# Patient Record
Sex: Male | Born: 1935 | Race: Black or African American | Hispanic: No | Smoking: Never smoker
Health system: Southern US, Community
[De-identification: ages and names within clinical notes are randomized; demographics above are authoritative.]

## PROBLEM LIST (undated history)

## (undated) DIAGNOSIS — I10 Essential (primary) hypertension: Secondary | ICD-10-CM

## (undated) DIAGNOSIS — E785 Hyperlipidemia, unspecified: Secondary | ICD-10-CM

## (undated) DIAGNOSIS — Z8546 Personal history of malignant neoplasm of prostate: Secondary | ICD-10-CM

## (undated) DIAGNOSIS — I480 Paroxysmal atrial fibrillation: Secondary | ICD-10-CM

## (undated) DIAGNOSIS — E119 Type 2 diabetes mellitus without complications: Secondary | ICD-10-CM

## (undated) DIAGNOSIS — M1990 Unspecified osteoarthritis, unspecified site: Secondary | ICD-10-CM

## (undated) DIAGNOSIS — I639 Cerebral infarction, unspecified: Secondary | ICD-10-CM

## (undated) DIAGNOSIS — C801 Malignant (primary) neoplasm, unspecified: Secondary | ICD-10-CM

## (undated) HISTORY — PX: PROSTATECTOMY: SHX69

## (undated) HISTORY — PX: NO PAST SURGERIES: SHX2092

## (undated) HISTORY — DX: Unspecified osteoarthritis, unspecified site: M19.90

## (undated) HISTORY — DX: Personal history of malignant neoplasm of prostate: Z85.46

## (undated) HISTORY — PX: ANKLE SURGERY: SHX546

---

## 2007-11-18 ENCOUNTER — Ambulatory Visit: Payer: Self-pay | Admitting: Gastroenterology

## 2010-11-24 ENCOUNTER — Ambulatory Visit: Payer: Self-pay | Admitting: Gastroenterology

## 2010-11-24 HISTORY — PX: COLONOSCOPY WITH ESOPHAGOGASTRODUODENOSCOPY (EGD): SHX5779

## 2011-02-09 ENCOUNTER — Inpatient Hospital Stay: Payer: Self-pay | Admitting: Specialist

## 2011-02-10 DIAGNOSIS — Z01810 Encounter for preprocedural cardiovascular examination: Secondary | ICD-10-CM

## 2013-12-08 ENCOUNTER — Ambulatory Visit: Payer: Self-pay | Admitting: Gastroenterology

## 2013-12-11 LAB — PATHOLOGY REPORT

## 2014-05-01 DIAGNOSIS — I10 Essential (primary) hypertension: Secondary | ICD-10-CM | POA: Insufficient documentation

## 2015-11-15 DIAGNOSIS — Z8546 Personal history of malignant neoplasm of prostate: Secondary | ICD-10-CM | POA: Insufficient documentation

## 2015-11-17 ENCOUNTER — Encounter: Payer: Self-pay | Admitting: Urology

## 2015-11-17 ENCOUNTER — Ambulatory Visit (INDEPENDENT_AMBULATORY_CARE_PROVIDER_SITE_OTHER): Payer: Medicare Other | Admitting: Urology

## 2015-11-17 VITALS — BP 147/72 | HR 69 | Ht 69.0 in | Wt 164.9 lb

## 2015-11-17 DIAGNOSIS — N5231 Erectile dysfunction following radical prostatectomy: Secondary | ICD-10-CM | POA: Diagnosis not present

## 2015-11-17 DIAGNOSIS — E785 Hyperlipidemia, unspecified: Secondary | ICD-10-CM | POA: Insufficient documentation

## 2015-11-17 DIAGNOSIS — Z8546 Personal history of malignant neoplasm of prostate: Secondary | ICD-10-CM | POA: Diagnosis not present

## 2015-11-17 NOTE — Progress Notes (Signed)
11/17/2015 2:43 PM   Jeffrey Rojas [birth year] [place]:[id number]  Referring provider: No referring provider defined for this encounter.  Chief Complaint  Patient presents with  . New Patient (Initial Visit)    hx of prostate cancer     HPI: The patient is a 81 year old gentleman with a past medical history of prostate cancer status post prostatectomy 15 years ago. He presents today for follow-up. He had a PSA drawn 2 days ago which was undetectable. He has done well since his surgery. He is continent. He has no voiding complaints. He has an IPP though from erectile dysfunction. He thinks his surgery removed Gleason 7 prostate cancer though he is not sure.   PMH: Past Medical History  Diagnosis Date  . Arthritis   . History of prostate cancer     Surgical History: No past surgical history on file.  Home Medications:    Medication List       This list is accurate as of: 11/17/15  2:43 PM.  Always use your most recent med list.               amLODipine-benazepril 10-20 MG capsule  Commonly known as:  LOTREL     aspirin EC 81 MG tablet  Take by mouth.     hydrochlorothiazide 25 MG tablet  Commonly known as:  HYDRODIURIL     simvastatin 20 MG tablet  Commonly known as:  ZOCOR        Allergies: No Known Allergies  Family History: Family History  Problem Relation Age of Onset  . Prostate cancer Neg Hx   . Kidney cancer Neg Hx     Social History:  reports that he has never smoked. He does not have any smokeless tobacco history on file. He reports that he drinks alcohol. He reports that he does not use illicit drugs.  ROS: UROLOGY Frequent Urination?: No Hard to postpone urination?: No Burning/pain with urination?: No Get up at night to urinate?: Yes (x 1 ) Leakage of urine?: No Urine stream starts and stops?: No Trouble starting stream?: No Do you have to strain to urinate?: No Blood in urine?: No Urinary tract infection?: No Sexually transmitted  disease?: No Injury to kidneys or bladder?: No Painful intercourse?: No Weak stream?: No Erection problems?: No Penile pain?: No  Gastrointestinal Nausea?: No Vomiting?: No Indigestion/heartburn?: No Diarrhea?: No Constipation?: No  Constitutional Fever: No Night sweats?: No Weight loss?: No Fatigue?: No  Skin Skin rash/lesions?: No Itching?: No  Eyes Blurred vision?: No Double vision?: No  Ears/Nose/Throat Sore throat?: No Sinus problems?: No  Hematologic/Lymphatic Swollen glands?: No Easy bruising?: No  Cardiovascular Leg swelling?: No Chest pain?: No  Respiratory Cough?: No Shortness of breath?: No  Endocrine Excessive thirst?: No  Musculoskeletal Back pain?: No Joint pain?: No  Neurological Headaches?: No Dizziness?: No  Psychologic Depression?: No Anxiety?: No  Physical Exam: BP 147/72 mmHg  Pulse 69  Ht 5\' 9"  (1.753 m)  Wt 164 lb 14.4 oz (74.798 kg)  BMI 24.34 kg/m2  Constitutional:  Alert and oriented, No acute distress. HEENT: [place] AT, moist mucus membranes.  Trachea midline, no masses. Cardiovascular: No clubbing, cyanosis, or edema. Respiratory: Normal respiratory effort, no increased work of breathing. GI: Abdomen is soft, nontender, nondistended, no abdominal masses GU: No CVA tenderness. IPP in place with no sign of skin breakdown. Skin: No rashes, bruises or suspicious lesions. Lymph: No cervical or inguinal adenopathy. Neurologic: Grossly intact, no focal deficits, moving all 4 extremities.  Psychiatric: Normal mood and affect.  Laboratory Data: No results found for: WBC, HGB, HCT, MCV, PLT  No results found for: CREATININE  No results found for: PSA  No results found for: TESTOSTERONE  No results found for: HGBA1C  Urinalysis No results found for: COLORURINE, APPEARANCEUR, LABSPEC, PHURINE, GLUCOSEU, HGBUR, BILIRUBINUR, KETONESUR, PROTEINUR, UROBILINOGEN, NITRITE, LEUKOCYTESUR    Assessment & Plan:   1. History  of prostate cancer The patient should have his PSA checked annually to ensure there is no biochemical recurrence. I offered him the opportunity of this perform our office or with his primary care doctor. He elected to follow-up with his primary care doctor for PSA checks on an annual basis. I think this is reasonable given that he is artery 15 years status post surgery. He will contact the office of his PSA starts to rise.   2. Erectile dysfunction Continue IPP   Return if symptoms worsen or fail to improve.  Nickie Retort, MD  [facility] [address], [place], [place] [address] [phone]

## 2017-06-06 ENCOUNTER — Other Ambulatory Visit: Payer: Self-pay | Admitting: Orthopedic Surgery

## 2017-07-03 DIAGNOSIS — I639 Cerebral infarction, unspecified: Secondary | ICD-10-CM

## 2017-07-03 HISTORY — DX: Cerebral infarction, unspecified: I63.9

## 2017-07-04 ENCOUNTER — Encounter: Payer: Self-pay | Admitting: Emergency Medicine

## 2017-07-04 ENCOUNTER — Emergency Department
Admission: EM | Admit: 2017-07-04 | Discharge: 2017-07-04 | Disposition: A | Discharge status: Home / Self Care | Payer: Medicare Other | Attending: Emergency Medicine | Admitting: Emergency Medicine

## 2017-07-04 ENCOUNTER — Emergency Department: Payer: Medicare Other

## 2017-07-04 ENCOUNTER — Other Ambulatory Visit: Payer: Self-pay

## 2017-07-04 DIAGNOSIS — Z79899 Other long term (current) drug therapy: Secondary | ICD-10-CM | POA: Insufficient documentation

## 2017-07-04 DIAGNOSIS — R079 Chest pain, unspecified: Secondary | ICD-10-CM

## 2017-07-04 DIAGNOSIS — I10 Essential (primary) hypertension: Secondary | ICD-10-CM | POA: Diagnosis not present

## 2017-07-04 LAB — TROPONIN I
Troponin I: <0.03 ng/mL (ref <0.03)
Troponin I: <0.03 ng/mL (ref <0.03)

## 2017-07-04 LAB — CBC
HEMATOCRIT: 47.4 % (ref 40.0–52.0)
Hemoglobin: 15.5 g/dL (ref 13.0–18.0)
MCH: 29.6 pg (ref 26.0–34.0)
MCHC: 32.8 g/dL (ref 32.0–36.0)
MCV: 90.2 fL (ref 80.0–100.0)
Platelets: 242 10*3/uL (ref 150–440)
RBC: 5.25 MIL/uL (ref 4.40–5.90)
RDW: 15.8 % — AB (ref 11.5–14.5)
WBC: 6.4 10*3/uL (ref 3.8–10.6)

## 2017-07-04 LAB — BASIC METABOLIC PANEL
Anion gap: 9 (ref 5–15)
BUN: 18 mg/dL (ref 6–20)
CHLORIDE: 100 mmol/L — AB (ref 101–111)
CO2: 30 mmol/L (ref 22–32)
Calcium: 9.7 mg/dL (ref 8.9–10.3)
Creatinine, Ser: 1.19 mg/dL (ref 0.61–1.24)
GFR calc Af Amer: >60 mL/min (ref >60)
GFR calc non Af Amer: 56 mL/min — ABNORMAL LOW (ref >60)
GLUCOSE: 77 mg/dL (ref 65–99)
POTASSIUM: 3.8 mmol/L (ref 3.5–5.1)
Sodium: 139 mmol/L (ref 135–145)

## 2017-07-04 NOTE — ED Notes (Signed)
Pt states central CP x 1 week. States pain in L arm as well. Denies any N&V&D, SOB, dizziness. Pt is alert, oriented. States pain comes and goes. Describes as pressure. No cardiac hx.

## 2017-07-04 NOTE — Discharge Instructions (Signed)
You have been seen in the emergency department today for chest pain. Your workup has shown normal results. As we discussed please follow-up with your primary care physician in the next 1-2 days for recheck. Return to the emergency department for any further chest pain, trouble breathing, or any other symptom personally concerning to yourself.  Please call the number provided for cardiology to arrange a follow-up appointment for possible stress test as soon as possible.

## 2017-07-04 NOTE — ED Provider Notes (Signed)
[facility] Emergency Department Provider Note  Time seen: 9:28 PM  I have reviewed the triage vital signs and the nursing notes.   HISTORY  Chief Complaint Chest Pain    HPI Jeffrey Rojas is a 83 y.o. male with a past medical history of prostate cancer, hyperlipidemia, hypertension presents to the emergency department for chest pain.  According to the patient for the past 1-2 weeks he has been experiencing intermittent chest pain with some radiation to the left shoulder.  States when the pain occurs it will last for minutes sometimes up to 30 minutes and then it resolved.  Patient denies any shortness of breath, nausea, diaphoresis or pleuritic nature to the pain.  Denies any history of MI or cardiac disease.  Patient denies any complaints currently and states he feels well.  Denies any recent leg pain or swelling.  States when the pain does occur it will be moderate and sharp.  No pain currently.   Past Medical History:  Diagnosis Date  . Arthritis   . History of prostate cancer     Patient Active Problem List   Diagnosis Date Noted  . HLD (hyperlipidemia) 11/17/2015  . H/O malignant neoplasm of prostate 11/15/2015  . BP (high blood pressure) 05/01/2014    History reviewed. No pertinent surgical history.  Prior to Admission medications   Medication Sig Start Date End Date Taking? Authorizing Provider  amLODipine-benazepril (LOTREL) 10-20 MG capsule  04/22/15   [provider]  aspirin EC 81 MG tablet Take by mouth.    [provider]  hydrochlorothiazide (HYDRODIURIL) 25 MG tablet  04/22/15   [provider]  simvastatin (ZOCOR) 20 MG tablet  04/22/15   [provider]    No Known Allergies  Family History  Problem Relation Age of Onset  . Prostate cancer Neg Hx   . Kidney cancer Neg Hx     Social History Social History   Tobacco Use  . Smoking status: Never Smoker  . Smokeless tobacco: Never Used   Substance Use Topics  . Alcohol use: Yes    Alcohol/week: 0.0 oz    Comment: ocassionally  . Drug use: No    Review of Systems Constitutional: Negative for fever. Eyes: No visual complaints. ENT: Negative for congestion Cardiovascular: Intermittent central left-sided chest pain times 1-2 weeks.  On currently. Respiratory: Negative for shortness of breath.  Negative for cough.   Gastrointestinal: Negative for abdominal pain, vomiting.  Genitourinary: Negative for dysuria. Musculoskeletal: Negative for leg pain or swelling Skin: Negative for rash. Neurological: Negative for headache All other ROS negative  ____________________________________________   PHYSICAL EXAM:  VITAL SIGNS: ED Triage Vitals  Enc Vitals Group     BP 07/04/17 1651 139/70     Pulse Rate 07/04/17 1651 61     Resp 07/04/17 1651 18     Temp 07/04/17 1651 97.9 F (36.6 C)     Temp Source 07/04/17 1651 Oral     SpO2 07/04/17 1651 99 %     Weight 07/04/17 1652 165 lb (74.8 kg)     Height 07/04/17 1652 5\' 9"  (1.753 m)     Head Circumference --      Peak Flow --      Pain Score 07/04/17 1658 8     Pain Loc --      Pain Edu? --      Excl. in [place]? --    Constitutional: Alert and oriented. Well appearing and in no distress.  Eyes: Normal exam ENT   Head: Normocephalic and atraumatic.   Mouth/Throat: Mucous membranes are moist. Cardiovascular: Normal rate, regular rhythm.  Respiratory: Normal respiratory effort without tachypnea nor retractions. Breath sounds are clear  Gastrointestinal: Soft and nontender. No distention.   Musculoskeletal: Nontender with normal range of motion in all extremities. No lower extremity tenderness or edema. Neurologic:  Normal speech and language. No gross focal neurologic deficits  Skin:  Skin is warm, dry and intact.  Psychiatric: Mood and affect are normal.   ____________________________________________    EKG  EKG reviewed and interpreted by myself shows  normal sinus rhythm at 60 bpm with a narrow QRS, normal axis, normal intervals, no concerning ST changes.  ____________________________________________    RADIOLOGY  Chest x-ray is normal.  ____________________________________________   INITIAL IMPRESSION / ASSESSMENT AND PLAN / ED COURSE  Pertinent labs & imaging results that were available during my care of the patient were reviewed by me and considered in my medical decision making (see chart for details).  Patient presents to the emergency department for intermittent central chest pain.  Currently denies any chest pain.  Labs are normal including a negative troponin.  EKG is normal.  X-ray is normal.  Overall the patient appears very well with a normal physical exam.  Largely normal vitals.  I discussed the options with the patient, he is agreeable to a repeat troponin.  If the repeat troponin is negative we will likely discharge with cardiology follow-up for further evaluation and possible stress test.  Patient agreeable to this plan of care.  Repeat troponin is negative.  We will discharge the patient home with cardiology follow-up.  Patient agreeable to this plan of care. ____________________________________________   FINAL CLINICAL IMPRESSION(S) / ED DIAGNOSES  Chest pain    Harvest Dark, MD 07/04/17 2242

## 2017-07-04 NOTE — ED Triage Notes (Signed)
Pt brought over from [facility] with reports of chest pain.

## 2017-07-09 ENCOUNTER — Emergency Department
Admission: EM | Admit: 2017-07-09 | Discharge: 2017-07-09 | Disposition: A | Discharge status: Other Acute Inpatient Hospital | Payer: Medicare Other | Attending: Emergency Medicine | Admitting: Emergency Medicine

## 2017-07-09 ENCOUNTER — Emergency Department: Payer: Medicare Other

## 2017-07-09 ENCOUNTER — Inpatient Hospital Stay (HOSPITAL_COMMUNITY)
Admission: EM | Admit: 2017-07-09 | Discharge: 2017-07-11 | DRG: 041 | Disposition: A | Discharge status: Home / Self Care | Payer: Medicare Other | Source: Other Acute Inpatient Hospital | Attending: Neurology | Admitting: Neurology

## 2017-07-09 ENCOUNTER — Other Ambulatory Visit: Payer: Self-pay

## 2017-07-09 ENCOUNTER — Inpatient Hospital Stay (HOSPITAL_COMMUNITY): Payer: Medicare Other

## 2017-07-09 DIAGNOSIS — R471 Dysarthria and anarthria: Secondary | ICD-10-CM | POA: Diagnosis present

## 2017-07-09 DIAGNOSIS — Z8546 Personal history of malignant neoplasm of prostate: Secondary | ICD-10-CM | POA: Diagnosis not present

## 2017-07-09 DIAGNOSIS — I63432 Cerebral infarction due to embolism of left posterior cerebral artery: Secondary | ICD-10-CM | POA: Diagnosis present

## 2017-07-09 DIAGNOSIS — R29709 NIHSS score 9: Secondary | ICD-10-CM | POA: Diagnosis present

## 2017-07-09 DIAGNOSIS — R410 Disorientation, unspecified: Secondary | ICD-10-CM | POA: Diagnosis present

## 2017-07-09 DIAGNOSIS — I10 Essential (primary) hypertension: Secondary | ICD-10-CM | POA: Diagnosis present

## 2017-07-09 DIAGNOSIS — I639 Cerebral infarction, unspecified: Secondary | ICD-10-CM

## 2017-07-09 DIAGNOSIS — E785 Hyperlipidemia, unspecified: Secondary | ICD-10-CM | POA: Diagnosis present

## 2017-07-09 DIAGNOSIS — H5340 Unspecified visual field defects: Secondary | ICD-10-CM | POA: Diagnosis present

## 2017-07-09 DIAGNOSIS — R414 Neurologic neglect syndrome: Secondary | ICD-10-CM | POA: Diagnosis present

## 2017-07-09 DIAGNOSIS — Z79899 Other long term (current) drug therapy: Secondary | ICD-10-CM | POA: Insufficient documentation

## 2017-07-09 DIAGNOSIS — R1310 Dysphagia, unspecified: Secondary | ICD-10-CM | POA: Diagnosis present

## 2017-07-09 DIAGNOSIS — R4701 Aphasia: Secondary | ICD-10-CM | POA: Diagnosis present

## 2017-07-09 DIAGNOSIS — Z7982 Long term (current) use of aspirin: Secondary | ICD-10-CM

## 2017-07-09 DIAGNOSIS — R7303 Prediabetes: Secondary | ICD-10-CM | POA: Diagnosis present

## 2017-07-09 DIAGNOSIS — G8191 Hemiplegia, unspecified affecting right dominant side: Secondary | ICD-10-CM | POA: Diagnosis present

## 2017-07-09 DIAGNOSIS — I351 Nonrheumatic aortic (valve) insufficiency: Secondary | ICD-10-CM | POA: Diagnosis not present

## 2017-07-09 DIAGNOSIS — I6389 Other cerebral infarction: Secondary | ICD-10-CM | POA: Diagnosis not present

## 2017-07-09 DIAGNOSIS — I340 Nonrheumatic mitral (valve) insufficiency: Secondary | ICD-10-CM | POA: Diagnosis not present

## 2017-07-09 DIAGNOSIS — R29702 NIHSS score 2: Secondary | ICD-10-CM | POA: Diagnosis not present

## 2017-07-09 DIAGNOSIS — R0789 Other chest pain: Secondary | ICD-10-CM

## 2017-07-09 DIAGNOSIS — E7800 Pure hypercholesterolemia, unspecified: Secondary | ICD-10-CM | POA: Diagnosis not present

## 2017-07-09 DIAGNOSIS — I63332 Cerebral infarction due to thrombosis of left posterior cerebral artery: Secondary | ICD-10-CM | POA: Diagnosis not present

## 2017-07-09 DIAGNOSIS — I6300 Cerebral infarction due to thrombosis of unspecified precerebral artery: Secondary | ICD-10-CM | POA: Diagnosis not present

## 2017-07-09 HISTORY — DX: Cerebral infarction, unspecified: I63.9

## 2017-07-09 HISTORY — DX: Malignant (primary) neoplasm, unspecified: C80.1

## 2017-07-09 LAB — COMPREHENSIVE METABOLIC PANEL
ALBUMIN: 4.6 g/dL (ref 3.5–5.0)
ALT: 25 U/L (ref 17–63)
AST: 26 U/L (ref 15–41)
Alkaline Phosphatase: 94 U/L (ref 38–126)
Anion gap: 11 (ref 5–15)
BUN: 17 mg/dL (ref 6–20)
CO2: 27 mmol/L (ref 22–32)
Calcium: 10 mg/dL (ref 8.9–10.3)
Chloride: 99 mmol/L — ABNORMAL LOW (ref 101–111)
Creatinine, Ser: 1.16 mg/dL (ref 0.61–1.24)
GFR calc Af Amer: >60 mL/min (ref >60)
GFR calc non Af Amer: 58 mL/min — ABNORMAL LOW (ref >60)
Glucose, Bld: 131 mg/dL — ABNORMAL HIGH (ref 65–99)
POTASSIUM: 3.5 mmol/L (ref 3.5–5.1)
SODIUM: 137 mmol/L (ref 135–145)
Total Bilirubin: 0.5 mg/dL (ref 0.3–1.2)
Total Protein: 8.7 g/dL — ABNORMAL HIGH (ref 6.5–8.1)

## 2017-07-09 LAB — CBC
HCT: 49.9 % (ref 40.0–52.0)
HEMOGLOBIN: 16.4 g/dL (ref 13.0–18.0)
MCH: 29.5 pg (ref 26.0–34.0)
MCHC: 32.9 g/dL (ref 32.0–36.0)
MCV: 89.6 fL (ref 80.0–100.0)
Platelets: 262 10*3/uL (ref 150–440)
RBC: 5.57 MIL/uL (ref 4.40–5.90)
RDW: 15.5 % — AB (ref 11.5–14.5)
WBC: 10.7 10*3/uL — ABNORMAL HIGH (ref 3.8–10.6)

## 2017-07-09 LAB — DIFFERENTIAL
BASOS ABS: 0.1 10*3/uL (ref 0–0.1)
Basophils Relative: 1 %
EOS ABS: 0 10*3/uL (ref 0–0.7)
Eosinophils Relative: 0 %
Lymphocytes Relative: 40 %
Lymphs Abs: 4.2 10*3/uL — ABNORMAL HIGH (ref 1.0–3.6)
Monocytes Absolute: 0.8 10*3/uL (ref 0.2–1.0)
Monocytes Relative: 8 %
Neutro Abs: 5.6 10*3/uL (ref 1.4–6.5)
Neutrophils Relative %: 51 %

## 2017-07-09 LAB — APTT: APTT: 29 s (ref 24–36)

## 2017-07-09 LAB — PROTIME-INR
INR: 1.03
Prothrombin Time: 13.4 seconds (ref 11.4–15.2)

## 2017-07-09 LAB — GLUCOSE, CAPILLARY: GLUCOSE-CAPILLARY: 125 mg/dL — AB (ref 65–99)

## 2017-07-09 LAB — TROPONIN I

## 2017-07-09 MED ORDER — SODIUM CHLORIDE 0.9 % IV SOLN: 50.0000 mL | Freq: Once | INTRAVENOUS | Status: DC

## 2017-07-09 MED ORDER — ALTEPLASE 100 MG IV SOLR
INTRAVENOUS | Status: AC
  Filled 2017-07-09: qty 100

## 2017-07-09 MED ORDER — STROKE: EARLY STAGES OF RECOVERY BOOK
Freq: Once | Status: AC
  Administered 2017-07-10: 01:00:00
  Filled 2017-07-09: qty 1

## 2017-07-09 MED ORDER — ACETAMINOPHEN 160 MG/5ML PO SOLN: 650.0000 mg | ORAL | Status: DC | PRN

## 2017-07-09 MED ORDER — ACETAMINOPHEN 650 MG RE SUPP: 650.0000 mg | RECTAL | Status: DC | PRN

## 2017-07-09 MED ORDER — NICARDIPINE HCL IN NACL 20-0.86 MG/200ML-% IV SOLN: 0.0000 mg/h | INTRAVENOUS | Status: DC

## 2017-07-09 MED ORDER — LABETALOL HCL 5 MG/ML IV SOLN: 20.0000 mg | Freq: Once | INTRAVENOUS | Status: DC

## 2017-07-09 MED ORDER — ALTEPLASE (STROKE) FULL DOSE INFUSION: 0.9000 mg/kg | Freq: Once | INTRAVENOUS | Status: DC

## 2017-07-09 MED ORDER — ACETAMINOPHEN 325 MG PO TABS
650.0000 mg | ORAL_TABLET | ORAL | Status: DC | PRN
  Administered 2017-07-11: 650 mg via ORAL
  Filled 2017-07-09: qty 2

## 2017-07-09 MED ORDER — IOPAMIDOL (ISOVUE-370) INJECTION 76%
100.0000 mL | Freq: Once | INTRAVENOUS | Status: AC | PRN
  Administered 2017-07-09: 100 mL via INTRAVENOUS

## 2017-07-09 MED ORDER — SODIUM CHLORIDE 0.9 % IV SOLN
INTRAVENOUS | Status: DC
  Administered 2017-07-09: 22:00:00 via INTRAVENOUS

## 2017-07-09 MED ORDER — SENNOSIDES-DOCUSATE SODIUM 8.6-50 MG PO TABS: 1.0000 | ORAL_TABLET | Freq: Every evening | ORAL | Status: DC | PRN

## 2017-07-09 NOTE — ED Notes (Signed)
Family friend at bedside.

## 2017-07-09 NOTE — Consult Note (Signed)
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 The risks/benefits/alternatives of IV tpa, including a 6 percent risk of brain bleed and 3 percent risk of death, was reviewed with the patient's daughter, and she consented to IV tpa.  Symptoms consistent with LVO therefore I recommend CTA/P head/neck.   Differential Diagnosis:  1. Cardioembolic stroke  2. Small vessel disease/lacune  3. Thromboembolic, artery-to-artery mechanism  4. Hypercoagulable state-related infarct  5. Transient ischemic attack  6. Thrombotic mechanism, large artery disease   Comments:   Door time:  1843 [facility] contacted: 1911 [facility] at bedside: 1917 NIHSS assessment time: 1924 Verbal tpa order: 1927 Needle time:   Verbal Consent to tPA:  I have explained to the patient/family/guardian the nature of the patient's condition, the use of tPA fibrinolytic agent, and the benefits to be reasonably expected compared with alternative approaches. I have discussed the likelihood of major risks or complications of this procedure including (if applicable) but not limited to loss of limb function, brain damage, paralysis, hemorrhage, infection, complications from transfusion of blood components, drug reactions, blood clots and loss of life. I have also indicated that with any procedure there is always the possibility of an unexpected complication. I have explained the risks which include:    1. Death, Stroke or permanent neurologic injury (paralysis, coma, etc)   2. Worsening of stroke symptoms from swelling or bleeding in the brain   3. Bleeding in other parts of the body   4. Need for blood transfusions to replace blood or clotting factors   5. Allergic reaction to medications   6. Other unexpected complications    All questions were answered and  the patient/family/guardian express understanding of the treatment plan and consent to the procedure.  Our recommendations are outlined below.   We will be seeing the patient back in follow up as noted.    Recommendations:   IV tPA - dose = 67.5 mg Routine post tPA monitoring including neuro checks and blood pressure control during/after treatment Monitor blood pressure Check blood pressure and NIHSS every 15 min for 2 h, then every 30 min for 6 h, and finally every hour for 16 h  Systolic greater than 967 OR diastolic greater than 893: Option 1: Labetalol 10 mg IV for 1 - 2 min May repeat or double labetalol every 10 min to maximum dose of 300 mg, or give initial labetalol dose, then start labetalol drip at 2 - 8 mg/min. Option 2: Nicardipine 5 mg/h IV infusion as initial dose and titrate to desired effect by increasing 2.5 mg/h every 5 min to maximum of 15 mg/h;  If blood pressure is not controlled by labetolol or nicardipine, consider sodium nitroprusside.  Admission to ICU CT brain 24 hours post tPA NPO until swallowing screen performed and passed No antiplatelet agents or anticoagulants (including heparin for DVT prophylaxis) in first 24 hours No Foley catheter, nasogastric tube, arterial catheter or central venous catheter for 24 hr, unless absolutely necessary Telemetry  Inpatient Neurology Consultation Stroke evaluation as per inpatient neurology recommendations Discussed with ED MD   ------------------------------------------------------------------------------  CC right arm weakness, VF cut, difficulty speaking.   History of Present Illness   Patient is a 83 yo woman with acute onset difficulty speaking, right VF cut and right arm weakness starting at approx 1840. No LOC/convulsion. No hx of stroke. No  blood thinners.   Diagnostics: CT head unremarkable.   Exam   RESULT SUMMARY: 10 points NIH Stroke Scale   INPUTS: 1A: Level of consciousness -> 0 = Alert; keenly  responsive 1B: Ask month and age -> 0 = Both questions right 1C: 'Blink eyes' & 'squeeze hands' -> 0 = Performs both tasks 2: Horizontal extraocular movements -> 1 = Partial gaze palsy: can be overcome 3: Visual fields -> 0 = No visual loss 4: Facial palsy -> 1 = Minor paralysis (flat nasolabial fold, smile asymmetry) 5A: Left arm motor drift -> 0 = No drift for 10 seconds 5B: Right arm motor drift -> 4 = No movement 6A: Left leg motor drift -> 0 = No drift for 5 seconds 6B: Right leg motor drift -> 1 = Drift, but doesn't hit bed 7: Limb Ataxia -> 0 = No ataxia 8: Sensation -> 0 = Normal; no sensory loss 9: Language/aphasia -> 2 = Severe aphasia: fragmentary expression, inference needed, cannot identify materials 10: Dysarthria -> 1 = Mild-moderate dysarthria: slurring but can be understood 11: Extinction/inattention -> 0 = No abnormality  Medical Decision Making:  - Extensive number of diagnosis or management options are considered above.   - Extensive amount of complex data reviewed.   - High risk of complication and/or morbidity or mortality are associated with differential diagnostic considerations above.  - There may be Uncertain outcome and increased probability of prolonged functional impairment or high probability of severe prolonged functional impairment associated with some of these differential diagnosis.  Medical Data Reviewed:  1.Data reviewed include clinical labs, radiology,  Medical Tests;   2.Tests results discussed w/performing or interpreting physician;   3.Obtaining/reviewing old medical records;  4.Obtaining case history from another source;  5.Independent review of image, tracing or specimen.    Patient was informed the Neurology Consult would happen via telehealth (remote video) and consented to receiving care in this manner.

## 2017-07-09 NOTE — ED Notes (Signed)
Family friend report pt had a Stress test on 07/05/2017

## 2017-07-09 NOTE — ED Notes (Signed)
Dr. Burlene Arnt order to start a 18 gauge IV needed for CT

## 2017-07-09 NOTE — ED Notes (Signed)
Given report to [facility], RN called at [phone]

## 2017-07-09 NOTE — ED Notes (Signed)
TPA bolus and infusion started per [facility] without interruption and given per policy.

## 2017-07-09 NOTE — ED Notes (Signed)
Dr. Denzil Magnuson spoke with pt's daughter who gave verbal order for treatment

## 2017-07-09 NOTE — ED Triage Notes (Signed)
Pt was pulled out of car by this RN and [employer]. Pt states he isn't feeling well. Pt was here visiting a pt and was driving away. Family noticed he wasn't acting right. Pt speech seems slurred. Pt isn't following all commands. Lifting R leg. Won't squeeze this RN's fingers. Won't look at this RN when asked to look. Won't smile to assess facial droop.

## 2017-07-09 NOTE — ED Notes (Signed)
Given report to [facility]

## 2017-07-09 NOTE — ED Notes (Signed)
Dr. Hal Morales tele neuro started assessment

## 2017-07-09 NOTE — ED Notes (Signed)
Administered bolos TPA 6.55ml per Dr. Geraldine Solar orders

## 2017-07-09 NOTE — ED Notes (Signed)
Pt left exam room

## 2017-07-09 NOTE — ED Notes (Signed)
Pt back from CT RN

## 2017-07-09 NOTE — H&P (Signed)
H&P  CC: RSW, aphasia, s/p tPA transfer from [facility]  History is obtained from:chart, patient  HPI: Jeffrey Rojas is a 83 y.o. 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 He was brought to [facility] where a tele neurology consultation was done for suspected stroke. NIHSS on tele eval was 9, no contraindications to tPA and was given tPA and transferred to [facility] for HLOC. CT head ASPECTS 10. CTA H+N and CTP showed Lt P1 occlusion. Area at risk 42, area of core 25. Mismatch 17cc. On arrival here in the [facility] ICU, the patient was evaluated at bedside.  He had no new complaints.  He denied any headaches or visual disturbances.  He denied any tingling numbness or weakness. His NIH stroke scale on my examination was 2.   LKW: 1840 hrs. tpa given?:  Yes-at [facility] Premorbid modified Rankin scale (mRS): 0  ROS: A 14 point ROS was performed and is negative except as noted in the HPI.   Review of Systems  Constitutional: Negative for chills and fever.  HENT: Negative for hearing loss and tinnitus.   Eyes: Negative for blurred vision and double vision.  Respiratory: Negative for cough and hemoptysis.   Cardiovascular: Negative for chest pain and palpitations.  Gastrointestinal: Negative for heartburn, nausea and vomiting.  Genitourinary: Negative for dysuria and urgency.  Musculoskeletal: Negative for myalgias and neck pain.  Skin: Negative for itching and rash.  Neurological: Negative for dizziness, tingling, tremors, sensory change, speech change, focal weakness, seizures, loss of consciousness and headaches.  Endo/Heme/Allergies: Negative for environmental allergies. Does not bruise/bleed easily.  Psychiatric/Behavioral: Negative for depression and suicidal ideas.    Past Medical History:  Diagnosis Date  . Arthritis   . Cancer ([place])   . History of prostate cancer      Family History  Problem Relation Age of Onset  . Prostate cancer Neg Hx   . Kidney cancer Neg Hx    Social History:   reports that  has never smoked. he has never used smokeless tobacco. He reports that he drinks alcohol. He reports that he does not use drugs.  Medications  Current Facility-Administered Medications:  .   stroke: mapping our early stages of recovery book, , Does not apply, Once, Amie Portland, MD .  0.9 %  sodium chloride infusion, , Intravenous, Continuous, Amie Portland, MD .  acetaminophen (TYLENOL) tablet 650 mg, 650 mg, Oral, Q4H PRN **OR** acetaminophen (TYLENOL) solution 650 mg, 650 mg, Per Tube, Q4H PRN **OR** acetaminophen (TYLENOL) suppository 650 mg, 650 mg, Rectal, Q4H PRN, Amie Portland, MD .  labetalol (NORMODYNE,TRANDATE) injection 20 mg, 20 mg, Intravenous, Once **AND** nicardipine (CARDENE) 20mg  in 0.86% saline 260ml IV infusion (0.1 mg/ml), 0-15 mg/hr, Intravenous, Continuous, Amie Portland, MD .  senna-docusate (Senokot-S) tablet 1 tablet, 1 tablet, Oral, QHS PRN, Amie Portland, MD  Exam: Current vital signs: BP (!) 152/75 (BP Location: Left Arm)   Pulse 81   Temp 97.7 F (36.5 C) (Oral)   Resp 15   Ht 5\' 9"  (1.753 m)   Wt 70.9 kg (156 lb 4.9 oz)   SpO2 99%   BMI 23.08 kg/m  Vital signs in last 24 hours: Temp:  [97.7 F (36.5 C)-98 F (36.7 C)] 97.7 F (36.5 C) (01/07 2112) Pulse Rate:  [70-95] 81 (01/07 2112) Resp:  [15-23] 15 (01/07 2112) BP: (141-162)/(70-97)  152/75 (01/07 2112) SpO2:  [99 %-100 %] 99 % (01/07 2112) Weight:  [70.9 kg (156 lb 4.9 oz)-74.8 kg (165 lb)] 70.9 kg (156 lb 4.9 oz) (01/07 2112) GENERAL: Awake, alert in NAD HEENT: - Normocephalic and atraumatic, dry mm, no LN++, no Thyromegally LUNGS - Clear to auscultation bilaterally with no wheezes CV - S1S2 RRR, no m/r/g, equal pulses bilaterally. ABDOMEN - Soft, nontender, nondistended with normoactive BS Ext: warm, well perfused, intact peripheral pulses, no  edema NEURO:  Mental Status: AA&Ox3  Language: speech is clear.  Naming, repetition, fluency, and comprehension intact Cranial Nerves: PERRL. EOMI, visual fields- right HH, no facial asymmetry, facial sensation intact, hearing intact, tongue/uvula/soft palate midline, norma sternocleidomastoid and trapezius muscle strength. No evidence of tongue atrophy or fibrillations Motor: 5/5 all over - no drift Tone: is normal and bulk is normal Sensation- Intact to light touch bilaterally - no extinction Coordination: FTN intact bilaterally, no ataxia in BLE. Gait- deferred  NIHSS 1a Level of Conscious.: 0 1b LOC Questions: 0 1c LOC Commands: 0 2 Best Gaze: 0 3 Visual: 2 4 Facial Palsy: 0 5a Motor Arm - left: 0 5b Motor Arm - Right: 0 6a Motor Leg - Left: 0 6b Motor Leg - Right: 0 7 Limb Ataxia: 0 8 Sensory: 0 9 Best Language: 0 10 Dysarthria: 0 11 Extinct. and Inatten.: 0 TOTAL: 2  Labs I have reviewed labs in epic and the results pertinent to this consultation are:  CBC    Component Value Date/Time   WBC 10.7 (H) 07/09/2017 1902   RBC 5.57 07/09/2017 1902   HGB 16.4 07/09/2017 1902   HCT 49.9 07/09/2017 1902   PLT 262 07/09/2017 1902   MCV 89.6 07/09/2017 1902   MCH 29.5 07/09/2017 1902   MCHC 32.9 07/09/2017 1902   RDW 15.5 (H) 07/09/2017 1902   LYMPHSABS 4.2 (H) 07/09/2017 1902   MONOABS 0.8 07/09/2017 1902   EOSABS 0.0 07/09/2017 1902   BASOSABS 0.1 07/09/2017 1902    CMP     Component Value Date/Time   NA 137 07/09/2017 1902   K 3.5 07/09/2017 1902   CL 99 (L) 07/09/2017 1902   CO2 27 07/09/2017 1902   GLUCOSE 131 (H) 07/09/2017 1902   BUN 17 07/09/2017 1902   CREATININE 1.16 07/09/2017 1902   CALCIUM 10.0 07/09/2017 1902   PROT 8.7 (H) 07/09/2017 1902   ALBUMIN 4.6 07/09/2017 1902   AST 26 07/09/2017 1902   ALT 25 07/09/2017 1902   ALKPHOS 94 07/09/2017 1902   BILITOT 0.5 07/09/2017 1902   GFRNONAA 58 (L) 07/09/2017 1902   GFRAA >60 07/09/2017 1902     Lipid Panel  No results found for: CHOL, TRIG, HDL, CHOLHDL, VLDL, LDLCALC, LDLDIRECT   Imaging I have reviewed the images obtained:[facility]H at [facility] -no acute changes, no evidence of bleed CT perfusion study done at [facility] showed left P1 occlusion with CBF less than 30% of 25 cc, T-max more than 6 seconds of 42 cc with a mismatch volume of 17 cc. Lot of streaking artifact, question left vertebral artery dissection.  Assessment:  83 year old man with a past medical history of prostate cancer last seen normal at 1840 hrs. on 07/09/2017, brought in to the emergency room at [facility] for not feeling right and being confused.  He was noted to have right-sided weakness, aphasia and right-sided neglect on initial exam with an NIH stroke scale of 9 by telemedicine neurology.  He was given IV TPA for these  symptoms suspected to be of acute ischemic stroke. He was transferred to [facility] for higher level of care after TPA administration. His CT angiogram of the head and neck and CT perfusion study showed a left P1 occlusion.  The perfusion study showed an area of core around 25 cc an area at risk of 42 cc.  This case was discussed with neuro endovascular specialist Dr. Estanislado Pandy, and because of his low stroke scale and rapidly improving symptoms, he was not deemed to be a candidate for IA therapy.  Impression: Acute ischemic stroke of the left PCA territory-likely cardio embolic Evaluate for Dissection of Vertebral Artery Cerebral atherosclerosis  Acuity: Acute Current Suspected Etiology: cardioembolic versus atheroembolic Continue Evaluation:  -Admit to: Neurological ICU -Hold Aspirin until 24 hour post tPA neuroimaging is stable and without evidence of bleeding -Blood pressure control, goal of SYS <180 -MRI/ECHO/A1C/Lipid panel. -Hyperglycemia management per SSI to maintain glucose 140-180mg /dL. -PT/OT/ST therapies and recommendations when able  CNS -Close neuro  monitoring Dysarthria Dysphagia following cerebral infarction  -NPO until cleared by speech -ST -Advance diet as tolerated  Hemiplegia and hemiparesis following cerebral infarction affecting right dominant side -nearly resolved -PT/OT  RESP -No acute changes -Monitor clinically  CV -Aggressive BP control, goal SBP < 180 -TTE  Hyperlipidemia, unspecified  - Statin for goal LDL < 70  HEME -Monitor -transfuse for hgb < 7 -Trend PT/PTT/INR  ENDO -goal HgbA1c < 7  GI/GU -Gentle hydration  Fluid/Electrolyte Disorders -Replete -Repeat labs  ID -No active clinical issues -Trend WBC and and vitals  Prophylaxis DVT: SCDs GI: Not applicable Bowel: Docusate/senna  Diet: NPO until cleared by speech therapy or bedside eval.  Code Status: Full Code  -- Amie Portland, MD [facility] [phone] If 7pm to 7am, please call on call as listed on AMION.   CRITICAL CARE Performed by: Amie Portland Total critical care time: 45 minutes Critical care time was exclusive of separately billable procedures and treating other patients. Critical care was necessary to treat or prevent imminent or life-threatening deterioration. Critical care was time spent personally by me on the following activities: development of treatment plan with patient and/or surrogate as well as nursing, discussions with consultants, evaluation of patient's response to treatment, examination of patient, obtaining history from patient or surrogate, ordering and performing treatments and interventions, ordering and review of laboratory studies, ordering and review of radiographic studies, pulse oximetry and re-evaluation of patient's condition.

## 2017-07-09 NOTE — Progress Notes (Signed)
Chaplain responded to a page for a Code Stroke pt. in ED02. Jeffrey Rojas met pt. and family at bedside. The MT was assessing the pt. Pt initially confused but later became coherent and was responding to verbal commands. Doctors stated that pt. would be transferred to [facility]. Jeffrey Rojas engaged pt. in conversation to keep him present, provided emotional support, prayer and a ministry of presence to pt. and his family.    07/09/17 2200  Clinical Encounter Type  Visited With Patient;Patient and family together  Visit Type Initial;Follow-up  Referral From Nurse  Consult/Referral To Chaplain  Spiritual Encounters  Spiritual Needs Other (Comment)

## 2017-07-09 NOTE — ED Provider Notes (Addendum)
[facility] Emergency Department Provider Note  ____________________________________________   I have reviewed the triage vital signs and the nursing notes. Where available I have reviewed prior notes and, if possible and indicated, outside hospital notes.    HISTORY  Chief Complaint No chief complaint on file.    HPI Jeffrey Rojas is a 83 y.o. male history of high blood pressure hyperlipidemia and prostate cancer, remotely, cannot give much of a history.  He was apparently, in a car with a friend whom he was picking up from the hospital and he became confused.  We cannot get further history from him I did call his next of kin on the phone and they have no further information they state he was normal "earlier today"  Court is a family friend, who is in the car with him, approximately 25 minutes ago he was completely normal pick them up and was driving and stated that he did not "feel right".  He seemed not to be appropriately responsive and was confused.  They had him stop the car.  He was not making sense so they immediately came back to the emergency room here.  Patient himself does not complain of headache, he does not complain of anything except for "not feeling right".  Blood sugar glucose is 100 at this time     Past Medical History:  Diagnosis Date  . Arthritis   . History of prostate cancer     Patient Active Problem List   Diagnosis Date Noted  . HLD (hyperlipidemia) 11/17/2015  . H/O malignant neoplasm of prostate 11/15/2015  . BP (high blood pressure) 05/01/2014    History reviewed. No pertinent surgical history.  Prior to Admission medications   Medication Sig Start Date End Date Taking? Authorizing Provider  amLODipine-benazepril (LOTREL) 10-20 MG capsule Take 1 capsule by mouth daily.     [provider]  aspirin EC 81 MG tablet Take 81 mg by mouth daily.     [provider]  hydrochlorothiazide (HYDRODIURIL) 25 MG tablet  Take 25 mg by mouth daily.     [provider]  simvastatin (ZOCOR) 20 MG tablet Take 20 mg by mouth daily.     [provider]    Allergies Patient has no known allergies.  Family History  Problem Relation Age of Onset  . Prostate cancer Neg Hx   . Kidney cancer Neg Hx     Social History Social History   Tobacco Use  . Smoking status: Never Smoker  . Smokeless tobacco: Never Used  Substance Use Topics  . Alcohol use: Yes    Alcohol/week: 0.0 oz    Comment: ocassionally  . Drug use: No    Review of Systems Constitutional: No fever/chills Eyes: No visual changes. ENT: No sore throat. No stiff neck no neck pain Cardiovascular: Denies chest pain. Respiratory: Denies shortness of breath. Gastrointestinal:   no vomiting.  No diarrhea.  No constipation. Genitourinary: Negative for dysuria. Musculoskeletal: Negative lower extremity swelling Skin: Negative for rash. Neurological: Negative for severe headaches, focal weakness or numbness.   ____________________________________________   PHYSICAL EXAM:  VITAL SIGNS: ED Triage Vitals [07/09/17 1847]  Enc Vitals Group     BP (!) 157/81     Pulse Rate 95     Resp (!) 23     Temp      Temp src      SpO2 99 %     Weight      Height  Head Circumference      Peak Flow      Pain Score      Pain Loc      Pain Edu?      Excl. in [place]?     Constitutional: Is awake, alert, knows his name, knows the date of birth unsure of where he is or what the actual date is at this time.  Seems anxious but otherwise nontoxic Eyes: Conjunctivae are normal Head: Atraumatic HEENT: No congestion/rhinnorhea. Mucous membranes are moist.  Oropharynx non-erythematous Neck:   Nontender with no meningismus, no masses, no stridor Cardiovascular: Normal rate, regular rhythm. Grossly normal heart sounds.  Good peripheral circulation. Respiratory: Normal respiratory effort.  No retractions. Lungs CTAB. Abdominal: Soft and  nontender. No distention. No guarding no rebound Back:  There is no focal tenderness or step off.  there is no midline tenderness there are no lesions noted. there is no CVA tenderness Musculoskeletal: No lower extremity tenderness, no upper extremity tenderness. No joint effusions, no DVT signs strong distal pulses no edema Neurologic:  Normal speech and language.  Will follow commands to some extent, very distractible however very limited history for this reason, no obvious cranial nerve deficit to the extent that he is compliant with exam, he does seem to have a left gaze preference, seems to have some neglect of the right upper extremity, can move the left lower extremities equally it appears, Skin:  Skin is warm, dry and intact. No rash noted. Psychiatric: Mood and affect are somewhat anxious. Speech and behavior are normal.  ____________________________________________   LABS (all labs ordered are listed, but only abnormal results are displayed)  Labs Reviewed  PROTIME-INR  APTT  CBC  DIFFERENTIAL  COMPREHENSIVE METABOLIC PANEL  TROPONIN I  URINALYSIS, COMPLETE (UACMP) WITH MICROSCOPIC  CBG MONITORING, ED    Pertinent labs  results that were available during my care of the patient were reviewed by me and considered in my medical decision making (see chart for details). ____________________________________________  EKG  I personally interpreted any EKGs ordered by me or triage Sinus rhythm rate 93 bpm, normal axis no acute ST elevation or depression, PVCs noted, ____________________________________________  RADIOLOGY  Pertinent labs & imaging results that were available during my care of the patient were reviewed by me and considered in my medical decision making (see chart for details). If possible, patient and/or family made aware of any abnormal findings.  Ct Head Code Stroke Wo Contrast`  Result Date: 07/09/2017 CLINICAL DATA:  Code stroke.  Ataxia and slurred speech.  EXAM: CT HEAD WITHOUT CONTRAST TECHNIQUE: Contiguous axial images were obtained from the base of the skull through the vertex without intravenous contrast. COMPARISON:  None. FINDINGS: Brain: No mass lesion or acute hemorrhage. No focal hypoattenuation of the basal ganglia or cortex to indicate infarcted tissue. No hydrocephalus or age advanced atrophy. Vascular: No hyperdense vessel. No advanced atherosclerotic calcification of the arteries at the skull base. Skull: Normal visualized skull base, calvarium and extracranial soft tissues. Sinuses/Orbits: No sinus fluid levels or advanced mucosal thickening. No mastoid effusion. Normal orbits. ASPECTS [facility] Stroke Program Early CT Score) - Ganglionic level infarction (caudate, lentiform nuclei, internal capsule, insula, M1-M3 cortex): 7 - Supraganglionic infarction (M4-M6 cortex): 3 Total score (0-10 with 10 being normal): 10 IMPRESSION: 1. No acute hemorrhage or mass lesion. 2. ASPECTS is 10. These results were called by telephone at the time of interpretation on 07/09/2017 at 7:01 pm to Dr. Charlotte Crumb , who verbally acknowledged these  results. Electronically Signed   By: Ulyses Jarred M.D.   On: 07/09/2017 19:02   ____________________________________________    PROCEDURES  Procedure(s) performed: None  Procedures  Critical Care performed: CRITICAL CARE Performed by: Schuyler Amor   Total critical care time: 70 minutes  Critical care time was exclusive of separately billable procedures and treating other patients.  Critical care was necessary to treat or prevent imminent or life-threatening deterioration.  Critical care was time spent personally by me on the following activities: development of treatment plan with patient and/or surrogate as well as nursing, discussions with consultants, evaluation of patient's response to treatment, examination of patient, obtaining history from patient or surrogate, ordering and performing treatments and  interventions, ordering and review of laboratory studies, ordering and review of radiographic studies, pulse oximetry and re-evaluation of patient's condition.   ____________________________________________   INITIAL IMPRESSION / ASSESSMENT AND PLAN / ED COURSE  Pertinent labs & imaging results that were available during my care of the patient were reviewed by me and considered in my medical decision making (see chart for details).  Patient here acutely confused with some evidence on my exam of right upper extremity neglect and possible weakness.  Very limited exam given patient ability to comply, no obvious facial droop by left gaze preference, all this is possibly consistent with a CVA blood glucose is normal CT scan is normal for age, we are consulting neurology for possible TPA administration, blood work is pending he has 2 IVs, and we are watching him closely.  ----------------------------------------- 7:19 PM on 07/09/2017 -----------------------------------------  On with teleneurologist  ----------------------------------------- 7:42 PM on 07/09/2017 -----------------------------------------  Dr. Denzil Magnuson requested TPA, I get an NIH stroke scale of at least 9, no significant improvement patient is moving his right arm slightly, discussed with Dr. Rory Percy at [facility], he also agrees with TPA also agrees with CTA and CT perfusion study which has been ordered here, we are awaiting neuro ICU bed at [facility] for definitive care, patient family has given consent to TPA, his brother and daughter were talked to in his life partner is at bedside all agree.  Patient himself I do not think can easily consent to this given his confusion.  He does agree.  We will continue to monitor the patient very closely, and he will be sent to [facility] after his CT scan as soon as a ready bed and transport are available.     ____________________________________________   FINAL CLINICAL IMPRESSION(S) /  ED DIAGNOSES  Final diagnoses:  None      This chart was dictated using voice recognition software.  Despite best efforts to proofread,  errors can occur which can change meaning.      Schuyler Amor, MD 07/09/17 1912    Schuyler Amor, MD 07/09/17 1919    Schuyler Amor, MD 07/09/17 1950    Schuyler Amor, MD 07/09/17 2030

## 2017-07-09 NOTE — ED Notes (Signed)
TeleNeuro computer at bedside

## 2017-07-09 NOTE — ED Notes (Signed)
Transport at [place]

## 2017-07-09 NOTE — ED Notes (Signed)
Pt taken to CT on stretcher by Janie RN  Symptoms started approx 10 minutes ago per this RN.

## 2017-07-09 NOTE — ED Notes (Signed)
TPA infusion administered at 60.42ml/hr

## 2017-07-10 ENCOUNTER — Inpatient Hospital Stay (HOSPITAL_COMMUNITY): Payer: Medicare Other

## 2017-07-10 ENCOUNTER — Encounter (HOSPITAL_COMMUNITY): Payer: Self-pay

## 2017-07-10 DIAGNOSIS — I351 Nonrheumatic aortic (valve) insufficiency: Secondary | ICD-10-CM

## 2017-07-10 DIAGNOSIS — I63432 Cerebral infarction due to embolism of left posterior cerebral artery: Principal | ICD-10-CM

## 2017-07-10 DIAGNOSIS — R0789 Other chest pain: Secondary | ICD-10-CM

## 2017-07-10 DIAGNOSIS — I63332 Cerebral infarction due to thrombosis of left posterior cerebral artery: Secondary | ICD-10-CM

## 2017-07-10 DIAGNOSIS — E7800 Pure hypercholesterolemia, unspecified: Secondary | ICD-10-CM

## 2017-07-10 LAB — HEMOGLOBIN A1C
Hgb A1c MFr Bld: 6.5 % — ABNORMAL HIGH (ref 4.8–5.6)
MEAN PLASMA GLUCOSE: 139.85 mg/dL

## 2017-07-10 LAB — LIPID PANEL
CHOL/HDL RATIO: 2.5 ratio
Cholesterol: 145 mg/dL (ref 0–200)
HDL: 59 mg/dL (ref >40)
LDL Cholesterol: 78 mg/dL (ref 0–99)
Triglycerides: 42 mg/dL (ref <150)
VLDL: 8 mg/dL (ref 0–40)

## 2017-07-10 LAB — MRSA PCR SCREENING: MRSA BY PCR: NEGATIVE

## 2017-07-10 LAB — ECHOCARDIOGRAM COMPLETE
HEIGHTINCHES: 69 in
Weight: 2500.9 oz

## 2017-07-10 LAB — GLUCOSE, CAPILLARY
GLUCOSE-CAPILLARY: 132 mg/dL — AB (ref 65–99)
Glucose-Capillary: 127 mg/dL — ABNORMAL HIGH (ref 65–99)

## 2017-07-10 LAB — MAGNESIUM: MAGNESIUM: 2 mg/dL (ref 1.7–2.4)

## 2017-07-10 LAB — TROPONIN I: Troponin I: <0.03 ng/mL (ref <0.03)

## 2017-07-10 MED ORDER — SIMVASTATIN 20 MG PO TABS: 20.0000 mg | ORAL_TABLET | Freq: Every day | ORAL | Status: DC

## 2017-07-10 MED ORDER — SIMVASTATIN 40 MG PO TABS
40.0000 mg | ORAL_TABLET | Freq: Every day | ORAL | Status: DC
  Administered 2017-07-10: 40 mg via ORAL
  Filled 2017-07-10: qty 1

## 2017-07-10 MED ORDER — INSULIN ASPART 100 UNIT/ML ~~LOC~~ SOLN
0.0000 [IU] | Freq: Three times a day (TID) | SUBCUTANEOUS | Status: DC
  Administered 2017-07-10: 1 [IU] via SUBCUTANEOUS

## 2017-07-10 MED ORDER — INSULIN ASPART 100 UNIT/ML ~~LOC~~ SOLN: 0.0000 [IU] | Freq: Every day | SUBCUTANEOUS | Status: DC

## 2017-07-10 MED ORDER — NITROGLYCERIN 0.4 MG SL SUBL
0.4000 mg | SUBLINGUAL_TABLET | SUBLINGUAL | Status: DC | PRN
  Administered 2017-07-10 (×2): 0.4 mg via SUBLINGUAL
  Filled 2017-07-10 (×2): qty 1

## 2017-07-10 NOTE — Care Management Note (Signed)
Case Management Note  Patient Details  Name: [name] MRN: [id number] Date of Birth: 07-24-1934  Subjective/Objective:     83 yo male admitted with L PCA CVA s/p tPA.  PTA, pt independent, lives at home alone.                 Action/Plan: PT/OT recommending OP follow up, possible ALF, though pt states he has a friend he can stay with at discharge.  Will confirm discharge arrangements with pt/family.  Will follow progress.    Expected Discharge Date:                  Expected Discharge Plan:  OP Rehab  In-House Referral:     Discharge planning Services  CM Consult  Post Acute Care Choice:    Choice offered to:     DME Arranged:    DME Agency:     HH Arranged:    HH Agency:     Status of Service:  In process, will continue to follow  If discussed at Long Length of Stay Meetings, dates discussed:    Additional Comments:  Reinaldo Raddle, RN, BSN  Trauma/Neuro ICU Case Manager [phone]

## 2017-07-10 NOTE — H&P (View-Only) (Signed)
NEUROHOSPITALISTS STROKE TEAM - DAILY PROGRESS NOTE   ADMISSION HISTORY: HPI: Jeffrey Rojas is a 83 y.o. male PMH prostate cancer who was LSN at [place] [facility] when he went to pick some family member from a hospital and was noted to be confused and not feeling right. He was brought to [facility] where a tele neurology consultation was done for suspected stroke. NIHSS on tele eval was 9, no contraindications to tPA and was given tPA and transferred to [facility] for HLOC. CT head ASPECTS 10. CTA H+N and CTP showed Lt P1 occlusion. Area at risk 42, area of core 25. Mismatch 17cc. On arrival here in the [facility] ICU, the patient was evaluated at bedside.  He had no new complaints.  He denied any headaches or visual disturbances.  He denied any tingling numbness or weakness. His NIH stroke scale on my examination was 2.  LKW: 1840 hrs. tpa given?:  Yes-at [place] [facility] Premorbid modified Rankin scale (mRS): 0 NIHSS 1a Level of Conscious.: 0 1b LOC Questions: 0 1c LOC Commands: 0 2 Best Gaze: 0 3 Visual: 2 4 Facial Palsy: 0 5a Motor Arm - left: 0 5b Motor Arm - Right: 0 6a Motor Leg - Left: 0 6b Motor Leg - Right: 0 7 Limb Ataxia: 0 8 Sensory: 0 9 Best Language: 0 10 Dysarthria: 0 11 Extinct. and Inatten.: 0 TOTAL: 2  SUBJECTIVE (INTERVAL HISTORY) Wife is at the bedside. Patient is found laying in bed in NAD. Overall he feels his condition is unchanged. Patient is complaining of some intermittent chest pain this morning. He has recently been evaluated by Cardiology at [facility] and had a stress test last week. He does not know the results yet.   He describes the pain a intermittent, "ache". Denies any left arm pain, no diaphoresis noted. Does not appear to be in acute pain and is not asking for pain medication. Admission Troponins and CXR negative. Troponin and Magnesium ordered stat. EKG - ok. Sublingual NG ordered. Patient states  he have been under a great amount of stress at home lately.   OBJECTIVE Lab Results: CBC:  Recent Labs  Lab 07/04/17 1652 07/09/17 1902  WBC 6.4 10.7*  HGB 15.5 16.4  HCT 47.4 49.9  MCV 90.2 89.6  PLT 242 262   BMP: Recent Labs  Lab 07/04/17 1652 07/09/17 1902 07/10/17 0902  NA 139 137  --   K 3.8 3.5  --   CL 100* 99*  --   CO2 30 27  --   GLUCOSE 77 131*  --   BUN 18 17  --   CREATININE 1.19 1.16  --   CALCIUM 9.7 10.0  --   MG  --   --  2.0   Liver Function Tests:  Recent Labs  Lab 07/09/17 1902  AST 26  ALT 25  ALKPHOS 94  BILITOT 0.5  PROT 8.7*  ALBUMIN 4.6   Cardiac Enzymes:  Recent Labs  Lab 07/04/17 1652 07/04/17 2136 07/09/17 1902 07/10/17 0902  TROPONINI <0.03 <0.03 <0.03 <0.03   Coagulation Studies:  Recent Labs    07/09/17 1902  APTT 29  INR 1.03   PHYSICAL EXAM Temp:  [97.7 F (36.5 C)-98 F (36.7 C)] 97.9 F (36.6 C) (01/08 1200) Pulse Rate:  [55-95] 66 (01/08 1400) Resp:  [11-23] 20 (01/08 1400) BP: (105-162)/(59-131) 134/65 (01/08 1400) SpO2:  [96 %-100 %] 100 % (01/08 1400) Weight:  [70.9 kg (156 lb 4.9 oz)-74.8 kg (165  lb)] 70.9 kg (156 lb 4.9 oz) (01/07 2112) General - Well nourished, well developed, in no apparent distress HEENT-  Normocephalic, Normal external eye/conjunctiva.  Normal external ears. Normal external nose, mucus membranes and septum.   Cardiovascular - Regular rate and rhythm  Respiratory - Lungs clear bilaterally. No wheezing. Abdomen - soft and non-tender, BS normal Extremities- no edema or cyanosis NEURO:  Mental Status: AA&Ox3  Language: speech is clear.  Naming, repetition, fluency, and comprehension intact but poor short term memory on bedside testing Cranial Nerves: PERRL. EOMI, visual fields- right  Dense HH, no facial asymmetry, facial sensation intact, hearing intact, tongue/uvula/soft palate midline, norma sternocleidomastoid and trapezius muscle strength. No evidence of tongue atrophy or  fibrillations Motor: 5/5 all over - no drift Tone: is normal and bulk is normal Sensation- Intact to light touch bilaterally - no extinction Coordination: FTN intact bilaterally, no ataxia in BLE. Gait- deferred  IMAGING: I have personally reviewed the radiological images below and agree with the radiology interpretations. Ct Angio Head Neck W Or Wo Contrast with Perfusion Result Date: 07/09/2017 IMPRESSION: 1. Occlusion of the left posterior cerebral artery at the junction of the P1 and P2 segments with associated left PCA distribution infarct. 2. Left PCA infarct volume measuring 25 mL with 17 mL of penumbra. 3. No other intracranial occlusion or high-grade stenosis. 4. Aortic Atherosclerosis (ICD10-I70.0). No hemodynamically significant stenosis of the carotid or vertebral arteries.   Dg Chest 2 View Result Date: 07/10/2017 IMPRESSION: No active cardiopulmonary disease. Tortuous atherosclerotic aorta without aneurysm.   Ct Head Code Stroke Wo Contrast` Result Date: 07/09/2017 IMPRESSION: 1. No acute hemorrhage or mass lesion. 2. ASPECTS is 10.   Echocardiogram:                                               Study Conclusions - Left ventricle: The cavity size was normal. Wall thickness was   normal. Systolic function was normal. The estimated ejection   fraction was in the range of 60% to 65%. - Aortic valve: There was very mild stenosis. There was mild   regurgitation. - Mitral valve: Valve area by pressure half-time: 2.12 cm^2. - Atrial septum: No defect or patent foramen ovale was identified.  MRI BRAIN                                              PENDING    IMPRESSION: [name] is a 83 y.o. male with PMH of 83 year old man with a past medical history of prostate cancer last seen normal at 1840 hrs. on 07/09/2017, brought in to the emergency room at [facility] for not feeling right and being confused.  He was noted to have right-sided weakness, aphasia and right-sided  neglect on initial exam with an NIH stroke scale of 9 by telemedicine neurology.  He was given IV TPA for these symptoms suspected to be of acute ischemic stroke. He was transferred to [facility] for higher level of care after TPA administration. His CT angiogram of the head and neck and CT perfusion study showed a left P1 occlusion.  The perfusion study showed an area of core around 25 cc an area at risk of 42 cc.  This case was discussed  with neuro endovascular specialist Dr. Estanislado Pandy, and because of his low stroke scale and rapidly improving symptoms, he was not deemed to be a candidate for IA therapy.  Occlusion of the left posterior cerebral artery at the junction of the P1 and P2  Left PCA distribution infarct.  Suspected Etiology: cardioembolic versus atheroembolic Resultant Symptoms: right-sided weakness, aphasia and right-sided neglect Stroke Risk Factors: hyperlipidemia and hypertension Other Stroke Risk Factors: Advanced age, Hx of Prostate Cancer  Outstanding Stroke Work-up Studies:     MRI BRAIN   07/10/2017 ASSESSMENT:   Neuro exam stable. Right sided weakness improved. Right visual field cut noted. Complaining of mild Chest pain on exam today. Admission Troponins, EKG and CXR negative. Troponin and Magnesium ordered stat. EKG - ok. Sublingual NG ordered. Will continue to monitor closely.  PLAN  07/10/2017: HOLD ASA until 24 hour post tPA neuroimaging is stable & without evidence of bleeding Continue Statin Frequent neuro checks Telemetry monitoring PT/OT/SLP May need TEE and Loop Recorder Placement - MRI imaging pending Ongoing aggressive stroke risk factor management Patient counseled to be compliant with his antithrombotic medications Patient counseled on Lifestyle modifications including, Diet, Exercise, and Stress Follow up with [facility] in 6 weeks Patient and wife instructed - no driving until cleared by PCP or Opthomology  INTRACRANIAL Atherosclerosis  &Stenosis: May need DAPT, will consider prior to discharge  R/O AFIB: May need TEE and Loop Recorder Placement, decision once MRI imaging reviewed  MEDICAL ISSUES: Chest Pain Admission troponins, EKG and chest x-ray negative Repeat troponins and magnesium within normal limits Repeat EKG stable Nitroglycerin sublingual x1 administered with good effect, no further reports of chest pain today Will continue to monitor closely Cardiology inpatient consultation Patient to follow-up with outpatient cardiology at [facility] at discharge  HYPERTENSION: Stable SBP goal less than 140/90  SBP goal of < 180. DBP goal of < 105.  Long term BP goal normotensive. May slowly restart home B/P medications after 48 hours Home Meds: Lotrel, HCTZ  HYPERLIPIDEMIA:    Component Value Date/Time   CHOL 145 07/10/2017 0431   TRIG 42 07/10/2017 0431   HDL 59 07/10/2017 0431   CHOLHDL 2.5 07/10/2017 0431   VLDL 8 07/10/2017 0431   LDLCALC 78 07/10/2017 0431  Home Meds:  Zocor 20 mg LDL  goal < 70 Increased to Zocor to 40 mg daily Continue statin at discharge  DIABETES: Lab Results  Component Value Date   HGBA1C 6.5 (H) 07/10/2017  HgbA1c goal < 7.0 Currently on: Novolog Continue CBG monitoring and SSI to maintain glucose 140-180 mg/dl DM education   Other Active Problems: Principal Problem:   Stroke (cerebrum) (HCC) Active Problems:   HLD (hyperlipidemia)   BP (high blood pressure)    Hospital day # 1 VTE prophylaxis: SCD's  Diet : Diet heart healthy/carb modified Room service appropriate? Yes; Fluid consistency: Thin   FAMILY UPDATES:  family at bedside  TEAM UPDATES: Garvin Fila, MD   Prior Home Stroke Medications:  aspirin 81 mg daily  Discharge Stroke Meds:  Please discharge patient on TBD   Disposition: [facility] Therapy Recs:               HOME WITH OUTPT PT/OT Home Equipment:         NONE Follow Up:  Follow-up Information    Garvin Fila, MD. Schedule an appointment as soon as possible for a visit in 6 week(s).   Specialties:  Neurology, Radiology Contact  information: [address] [place] [address] [phone]          Baxter Hire, MD -PCP Follow up in 1-2 weeks      Assessment & plan discussed with with attending physician and they are in agreement.    Mary Sella, ANP-C Stroke Neurology Team 07/10/2017 2:39 PM I have personally examined this patient, reviewed notes, independently viewed imaging studies, participated in medical decision making and plan of care.ROS completed by me personally and pertinent positives fully documented  I have made any additions or clarifications directly to the above note. Agree with note above.  He presented with embolic left posterior cerebral artery infarct and was treated with IV TPA. Recommend close ICU follow-up and strict blood pressure control. Continue ongoing stroke workup. Patient advised not to drive till his peripheral vision loss improves. Cardiology consult for his chest pain. This patient is critically ill and at significant risk of neurological worsening, death and care requires constant monitoring of vital signs, hemodynamics,respiratory and cardiac monitoring, extensive review of multiple databases, frequent neurological assessment, discussion with family, other specialists and medical decision making of high complexity.I have made any additions or clarifications directly to the above note.This critical care time does not reflect procedure time, or teaching time or supervisory time of PA/NP/Med Resident etc but could involve care discussion time.  I spent 35 minutes of neurocritical care time  in the care of  this patient.     Antony Contras, MD Medical Director [facility] Pager: [phone] 07/10/2017 5:36 PM  To contact Stroke Continuity provider, please refer to [email or web address] After hours, contact General Neurology

## 2017-07-10 NOTE — Consult Note (Signed)
Cardiology Consultation:   Patient ID: Jeffrey Rojas; [id number]; July 07, 1934   Admit date: 07/09/2017 Date of Consult: 07/10/2017  Primary Care Provider: Baxter Hire, MD Primary Cardiologist: new - Dr. Oval Linsey Primary Electrophysiologist:     Patient Profile:   Jeffrey Rojas is a 83 y.o. male with a hx of prostate cancer who presented to [facility] with confusion and "not feeling right" found to have stroke s/p TPA 07/09/17, HTN, and HLD who is being seen today for the evaluation of chest pain at the request of Dr. Leonie Man.  History of Present Illness:   Jeffrey Rojas has no known CAD. He denies past MI's and does not follow with cardiology at this time. He takes norvasc, benazepril, and HCTZ for HTN and statin and ASA for HLD.   He was riding in the care with a family friend 07/09/17 when he became confused. He was brought to [facility]. He had right-sided weakness, aphasia, and right-sided neglect.  After brain imaging and neurology consult, TPA was given and he was transferred to [facility] for higher level of care. Neurology suspects cardioembolic event.  His right-sided deficits are now nearly resolved.   On my interview, pt states he has been having intermittent chest pain for 6 months. He is a poor historian with details surrounding his chest pain. He does not work but is active around the house. He does not know if his chest pain is exertional in nature. He does not know how long the pain lasts. The pain is generally located in his left anterior chest and radiates to his left arm and is resolved with time. In [place], he reported to IM with chest pain who sent him to the ED. In the ED, he had 2 negative troponins and was sent home. He states he had chest pain with the onset of his stroke symptoms, but is not interested in any further ischemic evaluation at this time because he has a follow up appt next Thurs. He has not been evaluated by a [facility] cardiologist, per my Epic review,  and I do not see a follow up appt scheduled. Initial troponin was negative, but EKG with Q waves inferior leads.  Past Medical History:  Diagnosis Date  . Arthritis   . Cancer ([place])   . History of prostate cancer     No past surgical history on file.   Home Medications:  Prior to Admission medications   Medication Sig Start Date End Date Taking? Authorizing Provider  amLODipine-benazepril (LOTREL) 10-20 MG capsule Take 1 capsule by mouth daily.    Yes [provider]  aspirin EC 81 MG tablet Take 81 mg by mouth daily.    Yes [provider]  hydrochlorothiazide (HYDRODIURIL) 25 MG tablet Take 25 mg by mouth daily.    Yes [provider]  simvastatin (ZOCOR) 20 MG tablet Take 20 mg by mouth daily.    Yes [provider]    Inpatient Medications: Scheduled Meds: . labetalol  20 mg Intravenous Once  . simvastatin  40 mg Oral q1800   Continuous Infusions: . sodium chloride 75 mL/hr at 07/10/17 1100  . niCARDipine Stopped (07/10/17 0118)   PRN Meds: acetaminophen **OR** acetaminophen (TYLENOL) oral liquid 160 mg/5 mL **OR** acetaminophen, nitroGLYCERIN, senna-docusate  Allergies:   No Known Allergies  Social History:   Social History   Socioeconomic History  . Marital status: Widowed    Spouse name: Not on file  . Number of children: Not on file  .  Years of education: Not on file  . Highest education level: Not on file  Social Needs  . Financial resource strain: Not on file  . Food insecurity - worry: Not on file  . Food insecurity - inability: Not on file  . Transportation needs - medical: Not on file  . Transportation needs - non-medical: Not on file  Occupational History  . Not on file  Tobacco Use  . Smoking status: Never Smoker  . Smokeless tobacco: Never Used  Substance and Sexual Activity  . Alcohol use: Yes    Alcohol/week: 0.0 oz    Comment: ocassionally  . Drug use: No  . Sexual activity: Not on file  Other Topics  Concern  . Not on file  Social History Narrative  . Not on file    Family History:    Family History  Problem Relation Age of Onset  . Prostate cancer Neg Hx   . Kidney cancer Neg Hx      ROS:  Please see the history of present illness.  ROS  All other ROS reviewed and negative.     Physical Exam/Data:   Vitals:   07/10/17 1100 07/10/17 1130 07/10/17 1200 07/10/17 1214  BP: 105/64 125/70    Pulse: (!) 59 72  63  Resp: 17 14    Temp:   97.9 F (36.6 C)   TempSrc:   Oral   SpO2: 99% 100%  100%  Weight:      Height:        Intake/Output Summary (Last 24 hours) at 07/10/2017 1415 Last data filed at 07/10/2017 1130 Gross per 24 hour  Intake 991.25 ml  Output 1450 ml  Net -458.75 ml   Filed Weights   07/09/17 2112  Weight: 156 lb 4.9 oz (70.9 kg)   Body mass index is 23.08 kg/m.  General:  Well nourished, well developed, in no acute distress HEENT: normal Neck: no JVD Vascular: No carotid bruits Cardiac:  normal S1, S2; RRR; 3/6 murmur right sternal border  Lungs:  clear to auscultation bilaterally, no wheezing, rhonchi or rales  Abd: soft, nontender, no hepatomegaly  Ext: no edema Musculoskeletal:  No deformities, BUE and BLE strength normal and equal Skin: warm and dry  Neuro:  CNs 2-12 intact Psych:  Normal affect   EKG:  The EKG was personally reviewed and demonstrates: Q waves lead III and AVF Telemetry:  Telemetry was personally reviewed and demonstrates:  sinus  Relevant CV Studies:  Echo 07/10/17 Study Conclusions - Left ventricle: The cavity size was normal. Wall thickness was   normal. Systolic function was normal. The estimated ejection   fraction was in the range of 60% to 65%. - Aortic valve: There was very mild stenosis. There was mild   regurgitation. - Mitral valve: Valve area by pressure half-time: 2.12 cm^2. - Atrial septum: No defect or patent foramen ovale was identified.   Laboratory Data:  Chemistry Recent Labs  Lab  07/04/17 1652 07/09/17 1902  NA 139 137  K 3.8 3.5  CL 100* 99*  CO2 30 27  GLUCOSE 77 131*  BUN 18 17  CREATININE 1.19 1.16  CALCIUM 9.7 10.0  GFRNONAA 56* 58*  GFRAA >60 >60  ANIONGAP 9 11    Recent Labs  Lab 07/09/17 1902  PROT 8.7*  ALBUMIN 4.6  AST 26  ALT 25  ALKPHOS 94  BILITOT 0.5   Hematology Recent Labs  Lab 07/04/17 1652 07/09/17 1902  WBC 6.4 10.7*  RBC  5.25 5.57  HGB 15.5 16.4  HCT 47.4 49.9  MCV 90.2 89.6  MCH 29.6 29.5  MCHC 32.8 32.9  RDW 15.8* 15.5*  PLT 242 262   Cardiac Enzymes Recent Labs  Lab 07/04/17 1652 07/04/17 2136 07/09/17 1902 07/10/17 0902  TROPONINI <0.03 <0.03 <0.03 <0.03   No results for input(s): TROPIPOC in the last 168 hours.  BNPNo results for input(s): BNP, PROBNP in the last 168 hours.  DDimer No results for input(s): DDIMER in the last 168 hours.  Radiology/Studies:  Ct Angio Head W Or Wo Contrast  Result Date: 07/09/2017 CLINICAL DATA:  Acute onset weakness of the right arm. Aphasia. Right visual field cut. EXAM: CT ANGIOGRAPHY HEAD AND NECK CT PERFUSION BRAIN TECHNIQUE: Multidetector CT imaging of the head and neck was performed using the standard protocol during bolus administration of intravenous contrast. Multiplanar CT image reconstructions and MIPs were obtained to evaluate the vascular anatomy. Carotid stenosis measurements (when applicable) are obtained utilizing NASCET criteria, using the distal internal carotid diameter as the denominator. Multiphase CT imaging of the brain was performed following IV bolus contrast injection. Subsequent parametric perfusion maps were calculated using RAPID software. CONTRAST:  172mL ISOVUE-370 IOPAMIDOL (ISOVUE-370) INJECTION 76% COMPARISON:  Head CT 07/09/2017 FINDINGS: CTA NECK FINDINGS Aortic arch: There is mild calcific atherosclerosis of the aortic arch. There is no aneurysm, dissection or hemodynamically significant stenosis of the visualized ascending aorta and aortic arch.  Conventional 3 vessel aortic branching pattern. The visualized proximal subclavian arteries are normal. Right carotid system: The right common carotid origin is widely patent. There is no common carotid or internal carotid artery dissection or aneurysm. No hemodynamically significant stenosis. Left carotid system: The left common carotid origin is widely patent. There is no common carotid or internal carotid artery dissection or aneurysm. Atherosclerotic calcification at the carotid bifurcation without hemodynamically significant stenosis. Vertebral arteries: The vertebral system is left dominant. Both vertebral artery origins are normal. Both vertebral arteries are normal to their confluence with the basilar artery. Skeleton: There is no bony spinal canal stenosis. No lytic or blastic lesions. Other neck: The nasopharynx is clear. The oropharynx and hypopharynx are normal. The epiglottis is normal. The supraglottic larynx, glottis and subglottic larynx are normal. No retropharyngeal collection. The parapharyngeal spaces are preserved. The parotid and submandibular glands are normal. No sialolithiasis or salivary ductal dilatation. The thyroid gland is normal. There is no cervical lymphadenopathy. Upper chest: No pneumothorax or pleural effusion. No nodules or masses. Review of the MIP images confirms the above findings CTA HEAD FINDINGS Anterior circulation: --Intracranial internal carotid arteries: Atherosclerotic calcification without significant stenosis. --Anterior cerebral arteries: Absent right A1 segment, a normal variant. Normal left ACA. --Middle cerebral arteries: Normal. --Posterior communicating arteries: Absent bilaterally. Posterior circulation: --Posterior cerebral arteries: The left posterior cerebral artery is occluded at the P1-P2 junction. Normal right posterior cerebral artery. --Superior cerebellar arteries: Normal. --Basilar artery: Normal. --Anterior inferior cerebellar arteries: Normal.  --Posterior inferior cerebellar arteries: Normal. Venous sinuses: As permitted by contrast timing, patent. Anatomic variants: None Delayed phase: No parenchymal contrast enhancement. Review of the MIP images confirms the above findings CT Brain Perfusion Findings: CBF (<30%) Volume: 72mL Perfusion (Tmax>6.0s) volume: 21mL Mismatch Volume: 80mL Infarction Location:Left occipital lobe IMPRESSION: 1. Occlusion of the left posterior cerebral artery at the junction of the P1 and P2 segments with associated left PCA distribution infarct. 2. Left PCA infarct volume measuring 25 mL with 17 mL of penumbra. 3. No other intracranial occlusion or high-grade stenosis.  4. Aortic Atherosclerosis (ICD10-I70.0). No hemodynamically significant stenosis of the carotid or vertebral arteries. These results were communicated to Dr. Rory Percy at 9:03 pm on 07/09/2017 by text page via the [facility] messaging system. Electronically Signed   By: Ulyses Jarred M.D.   On: 07/09/2017 21:04   Dg Chest 2 View  Result Date: 07/10/2017 CLINICAL DATA:  Stroke EXAM: CHEST  2 VIEW COMPARISON:  None. FINDINGS: Top-normal sized heart. Tortuous atherosclerotic aorta without aneurysm. Atelectasis and/or scarring at the left lung base. No overt pulmonary edema. No effusion. Thoracic degenerative disc disease with osteophytes. No acute osseous abnormality. IMPRESSION: No active cardiopulmonary disease. Tortuous atherosclerotic aorta without aneurysm. Electronically Signed   By: Ashley Royalty M.D.   On: 07/10/2017 03:04   Ct Angio Neck W Or Wo Contrast  Result Date: 07/09/2017 CLINICAL DATA:  Acute onset weakness of the right arm. Aphasia. Right visual field cut. EXAM: CT ANGIOGRAPHY HEAD AND NECK CT PERFUSION BRAIN TECHNIQUE: Multidetector CT imaging of the head and neck was performed using the standard protocol during bolus administration of intravenous contrast. Multiplanar CT image reconstructions and MIPs were obtained to evaluate the vascular anatomy.  Carotid stenosis measurements (when applicable) are obtained utilizing NASCET criteria, using the distal internal carotid diameter as the denominator. Multiphase CT imaging of the brain was performed following IV bolus contrast injection. Subsequent parametric perfusion maps were calculated using RAPID software. CONTRAST:  172mL ISOVUE-370 IOPAMIDOL (ISOVUE-370) INJECTION 76% COMPARISON:  Head CT 07/09/2017 FINDINGS: CTA NECK FINDINGS Aortic arch: There is mild calcific atherosclerosis of the aortic arch. There is no aneurysm, dissection or hemodynamically significant stenosis of the visualized ascending aorta and aortic arch. Conventional 3 vessel aortic branching pattern. The visualized proximal subclavian arteries are normal. Right carotid system: The right common carotid origin is widely patent. There is no common carotid or internal carotid artery dissection or aneurysm. No hemodynamically significant stenosis. Left carotid system: The left common carotid origin is widely patent. There is no common carotid or internal carotid artery dissection or aneurysm. Atherosclerotic calcification at the carotid bifurcation without hemodynamically significant stenosis. Vertebral arteries: The vertebral system is left dominant. Both vertebral artery origins are normal. Both vertebral arteries are normal to their confluence with the basilar artery. Skeleton: There is no bony spinal canal stenosis. No lytic or blastic lesions. Other neck: The nasopharynx is clear. The oropharynx and hypopharynx are normal. The epiglottis is normal. The supraglottic larynx, glottis and subglottic larynx are normal. No retropharyngeal collection. The parapharyngeal spaces are preserved. The parotid and submandibular glands are normal. No sialolithiasis or salivary ductal dilatation. The thyroid gland is normal. There is no cervical lymphadenopathy. Upper chest: No pneumothorax or pleural effusion. No nodules or masses. Review of the MIP images  confirms the above findings CTA HEAD FINDINGS Anterior circulation: --Intracranial internal carotid arteries: Atherosclerotic calcification without significant stenosis. --Anterior cerebral arteries: Absent right A1 segment, a normal variant. Normal left ACA. --Middle cerebral arteries: Normal. --Posterior communicating arteries: Absent bilaterally. Posterior circulation: --Posterior cerebral arteries: The left posterior cerebral artery is occluded at the P1-P2 junction. Normal right posterior cerebral artery. --Superior cerebellar arteries: Normal. --Basilar artery: Normal. --Anterior inferior cerebellar arteries: Normal. --Posterior inferior cerebellar arteries: Normal. Venous sinuses: As permitted by contrast timing, patent. Anatomic variants: None Delayed phase: No parenchymal contrast enhancement. Review of the MIP images confirms the above findings CT Brain Perfusion Findings: CBF (<30%) Volume: 40mL Perfusion (Tmax>6.0s) volume: 78mL Mismatch Volume: 68mL Infarction Location:Left occipital lobe IMPRESSION: 1. Occlusion of the left posterior  cerebral artery at the junction of the P1 and P2 segments with associated left PCA distribution infarct. 2. Left PCA infarct volume measuring 25 mL with 17 mL of penumbra. 3. No other intracranial occlusion or high-grade stenosis. 4. Aortic Atherosclerosis (ICD10-I70.0). No hemodynamically significant stenosis of the carotid or vertebral arteries. These results were communicated to Dr. Rory Percy at 9:03 pm on 07/09/2017 by text page via the [facility] messaging system. Electronically Signed   By: Ulyses Jarred M.D.   On: 07/09/2017 21:04   Ct Cerebral Perfusion W Contrast  Result Date: 07/09/2017 CLINICAL DATA:  Acute onset weakness of the right arm. Aphasia. Right visual field cut. EXAM: CT ANGIOGRAPHY HEAD AND NECK CT PERFUSION BRAIN TECHNIQUE: Multidetector CT imaging of the head and neck was performed using the standard protocol during bolus administration of intravenous  contrast. Multiplanar CT image reconstructions and MIPs were obtained to evaluate the vascular anatomy. Carotid stenosis measurements (when applicable) are obtained utilizing NASCET criteria, using the distal internal carotid diameter as the denominator. Multiphase CT imaging of the brain was performed following IV bolus contrast injection. Subsequent parametric perfusion maps were calculated using RAPID software. CONTRAST:  138mL ISOVUE-370 IOPAMIDOL (ISOVUE-370) INJECTION 76% COMPARISON:  Head CT 07/09/2017 FINDINGS: CTA NECK FINDINGS Aortic arch: There is mild calcific atherosclerosis of the aortic arch. There is no aneurysm, dissection or hemodynamically significant stenosis of the visualized ascending aorta and aortic arch. Conventional 3 vessel aortic branching pattern. The visualized proximal subclavian arteries are normal. Right carotid system: The right common carotid origin is widely patent. There is no common carotid or internal carotid artery dissection or aneurysm. No hemodynamically significant stenosis. Left carotid system: The left common carotid origin is widely patent. There is no common carotid or internal carotid artery dissection or aneurysm. Atherosclerotic calcification at the carotid bifurcation without hemodynamically significant stenosis. Vertebral arteries: The vertebral system is left dominant. Both vertebral artery origins are normal. Both vertebral arteries are normal to their confluence with the basilar artery. Skeleton: There is no bony spinal canal stenosis. No lytic or blastic lesions. Other neck: The nasopharynx is clear. The oropharynx and hypopharynx are normal. The epiglottis is normal. The supraglottic larynx, glottis and subglottic larynx are normal. No retropharyngeal collection. The parapharyngeal spaces are preserved. The parotid and submandibular glands are normal. No sialolithiasis or salivary ductal dilatation. The thyroid gland is normal. There is no cervical  lymphadenopathy. Upper chest: No pneumothorax or pleural effusion. No nodules or masses. Review of the MIP images confirms the above findings CTA HEAD FINDINGS Anterior circulation: --Intracranial internal carotid arteries: Atherosclerotic calcification without significant stenosis. --Anterior cerebral arteries: Absent right A1 segment, a normal variant. Normal left ACA. --Middle cerebral arteries: Normal. --Posterior communicating arteries: Absent bilaterally. Posterior circulation: --Posterior cerebral arteries: The left posterior cerebral artery is occluded at the P1-P2 junction. Normal right posterior cerebral artery. --Superior cerebellar arteries: Normal. --Basilar artery: Normal. --Anterior inferior cerebellar arteries: Normal. --Posterior inferior cerebellar arteries: Normal. Venous sinuses: As permitted by contrast timing, patent. Anatomic variants: None Delayed phase: No parenchymal contrast enhancement. Review of the MIP images confirms the above findings CT Brain Perfusion Findings: CBF (<30%) Volume: 41mL Perfusion (Tmax>6.0s) volume: 8mL Mismatch Volume: 34mL Infarction Location:Left occipital lobe IMPRESSION: 1. Occlusion of the left posterior cerebral artery at the junction of the P1 and P2 segments with associated left PCA distribution infarct. 2. Left PCA infarct volume measuring 25 mL with 17 mL of penumbra. 3. No other intracranial occlusion or high-grade stenosis. 4. Aortic Atherosclerosis (ICD10-I70.0). No  hemodynamically significant stenosis of the carotid or vertebral arteries. These results were communicated to Dr. Rory Percy at 9:03 pm on 07/09/2017 by text page via the [facility] messaging system. Electronically Signed   By: Ulyses Jarred M.D.   On: 07/09/2017 21:04   Ct Head Code Stroke Wo Contrast`  Result Date: 07/09/2017 CLINICAL DATA:  Code stroke.  Ataxia and slurred speech. EXAM: CT HEAD WITHOUT CONTRAST TECHNIQUE: Contiguous axial images were obtained from the base of the skull through the  vertex without intravenous contrast. COMPARISON:  None. FINDINGS: Brain: No mass lesion or acute hemorrhage. No focal hypoattenuation of the basal ganglia or cortex to indicate infarcted tissue. No hydrocephalus or age advanced atrophy. Vascular: No hyperdense vessel. No advanced atherosclerotic calcification of the arteries at the skull base. Skull: Normal visualized skull base, calvarium and extracranial soft tissues. Sinuses/Orbits: No sinus fluid levels or advanced mucosal thickening. No mastoid effusion. Normal orbits. ASPECTS [facility] Stroke Program Early CT Score) - Ganglionic level infarction (caudate, lentiform nuclei, internal capsule, insula, M1-M3 cortex): 7 - Supraganglionic infarction (M4-M6 cortex): 3 Total score (0-10 with 10 being normal): 10 IMPRESSION: 1. No acute hemorrhage or mass lesion. 2. ASPECTS is 10. These results were called by telephone at the time of interpretation on 07/09/2017 at 7:01 pm to Dr. Charlotte Crumb , who verbally acknowledged these results. Electronically Signed   By: Ulyses Jarred M.D.   On: 07/09/2017 19:02    Assessment and Plan:   1. Chest pain - initial troponin negative - EKG with Q waves in Lead III and AVF  - pt states he had a stress test on a treadmill last week - I do not see records of this in Epic - may have been completed by [facility] - per the urging of his PCP, he reported to ED 07/04/17 for chest pain, was found to have 2 negative troponins and was discharged home with cardiology follow up - he has risk factors for ACS, including HTN and HLD; he is a never smoker - he denies current chest pain and states he wants to defer ischemic evaluation - echo without wall motion abnormality - telemetry without arrhythmias    2. S/P CVA and TPA - residual right-sided deficit - per neuro   3. HTN - continue home meds when permissive pressure time is over    4. HLD - continue statin - LDL 78, HDL 59, triglycerides 42 - continue ASA when safe to do so  per neuro    For questions or updates, please contact [place] Please consult [email or web address] for contact info under Cardiology/STEMI.   Signed, Tami Lin [facility], PA  07/10/2017 2:15 PM
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
## 2017-07-10 NOTE — Evaluation (Signed)
Speech Language Pathology Evaluation Patient Details Name: Veniamin Kincaid MRN: [id number] DOB: 07-Feb-1935 Today's Date: 07/10/2017 Time: 3343-5686 SLP Time Calculation (min) (ACUTE ONLY): 20 min  Problem List:  Patient Active Problem List   Diagnosis Date Noted  . Stroke (cerebrum) ([place]) 07/09/2017  . HLD (hyperlipidemia) 11/17/2015  . H/O malignant neoplasm of prostate 11/15/2015  . BP (high blood pressure) 05/01/2014   Past Medical History:  Past Medical History:  Diagnosis Date  . Arthritis   . Cancer ([place])   . History of prostate cancer    Past Surgical History: No past surgical history on file. HPI:  83 y.o. male with hx prostate ca brought to [facility] with confusion, aphasia, right sided neglect and weakness.  Administered IV TPA and transferred to [facility].     Assessment / Plan / Recommendation Clinical Impression  Pt presents with a mild aphasia marked by comprehension deficits noted with three step commands, answering factual questions (75% accuracy); basic naming is intact, but divergent naming (generating lists) is significantly impaired and spontaneity of output is limited.  Recommend acute SLP f/u to to address aphasia, education, and determine f/u needs.     SLP Assessment  SLP Recommendation/Assessment: Patient needs continued Speech Lanaguage Pathology Services SLP Visit Diagnosis: Aphasia (R47.01)    Follow Up Recommendations  Other (comment)(tba)    Frequency and Duration min 2x/week  1 week      SLP Evaluation Cognition  Overall Cognitive Status: Impaired/Different from baseline Orientation Level: Oriented to person;Oriented to place;Disoriented to time;Disoriented to situation       Comprehension  Auditory Comprehension Overall Auditory Comprehension: Impaired Yes/No Questions: Impaired Complex Questions: 75-100% accurate Commands: Impaired Complex Commands: 50-74% accurate Visual Recognition/Discrimination Discrimination: Within Function  Limits Reading Comprehension Reading Status: Not tested    Expression Expression Primary Mode of Expression: Verbal Verbal Expression Overall Verbal Expression: Impaired Initiation: No impairment Automatic Speech: Social Response;Day of week;Month of year Level of Generative/Spontaneous Verbalization: Sentence Repetition: Impaired(complex phrases) Naming: Impairment Responsive: 76-100% accurate Confrontation: Within functional limits Divergent: 50-74% accurate Written Expression Written Expression: Not tested   Oral / Motor  Oral Motor/Sensory Function Overall Oral Motor/Sensory Function: Within functional limits Motor Speech Overall Motor Speech: Appears within functional limits for tasks assessed   GO                   Kadince Boxley L. Tivis Ringer, [place] CCC/SLP Pager [phone]  Juan Quam Laurice 07/10/2017, 10:00 AM

## 2017-07-10 NOTE — Progress Notes (Signed)
NEUROHOSPITALISTS STROKE TEAM - DAILY PROGRESS NOTE   ADMISSION HISTORY: HPI: Jeffrey Rojas is a 83 y.o. male PMH prostate cancer who was LSN at [place] hrs when he went to pick some family member from a hospital and was noted to be confused and not feeling right. He was brought to [facility] where a tele neurology consultation was done for suspected stroke. NIHSS on tele eval was 9, no contraindications to tPA and was given tPA and transferred to [facility] for HLOC. CT head ASPECTS 10. CTA H+N and CTP showed Lt P1 occlusion. Area at risk 42, area of core 25. Mismatch 17cc. On arrival here in the [facility] ICU, the patient was evaluated at bedside.  He had no new complaints.  He denied any headaches or visual disturbances.  He denied any tingling numbness or weakness. His NIH stroke scale on my examination was 2.  LKW: 1840 hrs. tpa given?:  Yes-at [place] [facility] Premorbid modified Rankin scale (mRS): 0 NIHSS 1a Level of Conscious.: 0 1b LOC Questions: 0 1c LOC Commands: 0 2 Best Gaze: 0 3 Visual: 2 4 Facial Palsy: 0 5a Motor Arm - left: 0 5b Motor Arm - Right: 0 6a Motor Leg - Left: 0 6b Motor Leg - Right: 0 7 Limb Ataxia: 0 8 Sensory: 0 9 Best Language: 0 10 Dysarthria: 0 11 Extinct. and Inatten.: 0 TOTAL: 2  SUBJECTIVE (INTERVAL HISTORY) Wife is at the bedside. Patient is found laying in bed in NAD. Overall he feels his condition is unchanged. Patient is complaining of some intermittent chest pain this morning. He has recently been evaluated by Cardiology at [facility] and had a stress test last week. He does not know the results yet.   He describes the pain a intermittent, "ache". Denies any left arm pain, no diaphoresis noted. Does not appear to be in acute pain and is not asking for pain medication. Admission Troponins and CXR negative. Troponin and Magnesium ordered stat. EKG - ok. Sublingual NG ordered. Patient states  he have been under a great amount of stress at home lately.   OBJECTIVE Lab Results: CBC:  Recent Labs  Lab 07/04/17 1652 07/09/17 1902  WBC 6.4 10.7*  HGB 15.5 16.4  HCT 47.4 49.9  MCV 90.2 89.6  PLT 242 262   BMP: Recent Labs  Lab 07/04/17 1652 07/09/17 1902 07/10/17 0902  NA 139 137  --   K 3.8 3.5  --   CL 100* 99*  --   CO2 30 27  --   GLUCOSE 77 131*  --   BUN 18 17  --   CREATININE 1.19 1.16  --   CALCIUM 9.7 10.0  --   MG  --   --  2.0   Liver Function Tests:  Recent Labs  Lab 07/09/17 1902  AST 26  ALT 25  ALKPHOS 94  BILITOT 0.5  PROT 8.7*  ALBUMIN 4.6   Cardiac Enzymes:  Recent Labs  Lab 07/04/17 1652 07/04/17 2136 07/09/17 1902 07/10/17 0902  TROPONINI <0.03 <0.03 <0.03 <0.03   Coagulation Studies:  Recent Labs    07/09/17 1902  APTT 29  INR 1.03   PHYSICAL EXAM Temp:  [97.7 F (36.5 C)-98 F (36.7 C)] 97.9 F (36.6 C) (01/08 1200) Pulse Rate:  [55-95] 66 (01/08 1400) Resp:  [11-23] 20 (01/08 1400) BP: (105-162)/(59-131) 134/65 (01/08 1400) SpO2:  [96 %-100 %] 100 % (01/08 1400) Weight:  [70.9 kg (156 lb 4.9 oz)-74.8 kg (165  lb)] 70.9 kg (156 lb 4.9 oz) (01/07 2112) General - Well nourished, well developed, in no apparent distress HEENT-  Normocephalic, Normal external eye/conjunctiva.  Normal external ears. Normal external nose, mucus membranes and septum.   Cardiovascular - Regular rate and rhythm  Respiratory - Lungs clear bilaterally. No wheezing. Abdomen - soft and non-tender, BS normal Extremities- no edema or cyanosis NEURO:  Mental Status: AA&Ox3  Language: speech is clear.  Naming, repetition, fluency, and comprehension intact but poor short term memory on bedside testing Cranial Nerves: PERRL. EOMI, visual fields- right  Dense HH, no facial asymmetry, facial sensation intact, hearing intact, tongue/uvula/soft palate midline, norma sternocleidomastoid and trapezius muscle strength. No evidence of tongue atrophy or  fibrillations Motor: 5/5 all over - no drift Tone: is normal and bulk is normal Sensation- Intact to light touch bilaterally - no extinction Coordination: FTN intact bilaterally, no ataxia in BLE. Gait- deferred  IMAGING: I have personally reviewed the radiological images below and agree with the radiology interpretations. Ct Angio Head Neck W Or Wo Contrast with Perfusion Result Date: 07/09/2017 IMPRESSION: 1. Occlusion of the left posterior cerebral artery at the junction of the P1 and P2 segments with associated left PCA distribution infarct. 2. Left PCA infarct volume measuring 25 mL with 17 mL of penumbra. 3. No other intracranial occlusion or high-grade stenosis. 4. Aortic Atherosclerosis (ICD10-I70.0). No hemodynamically significant stenosis of the carotid or vertebral arteries.   Dg Chest 2 View Result Date: 07/10/2017 IMPRESSION: No active cardiopulmonary disease. Tortuous atherosclerotic aorta without aneurysm.   Ct Head Code Stroke Wo Contrast` Result Date: 07/09/2017 IMPRESSION: 1. No acute hemorrhage or mass lesion. 2. ASPECTS is 10.   Echocardiogram:                                               Study Conclusions - Left ventricle: The cavity size was normal. Wall thickness was   normal. Systolic function was normal. The estimated ejection   fraction was in the range of 60% to 65%. - Aortic valve: There was very mild stenosis. There was mild   regurgitation. - Mitral valve: Valve area by pressure half-time: 2.12 cm^2. - Atrial septum: No defect or patent foramen ovale was identified.  MRI BRAIN                                              PENDING    IMPRESSION: Jeffrey Rojas is a 83 y.o. male with PMH of 83 year old man with a past medical history of prostate cancer last seen normal at 1840 hrs. on 07/09/2017, brought in to the emergency room at [facility] for not feeling right and being confused.  He was noted to have right-sided weakness, aphasia and right-sided  neglect on initial exam with an NIH stroke scale of 9 by telemedicine neurology.  He was given IV TPA for these symptoms suspected to be of acute ischemic stroke. He was transferred to [facility] for higher level of care after TPA administration. His CT angiogram of the head and neck and CT perfusion study showed a left P1 occlusion.  The perfusion study showed an area of core around 25 cc an area at risk of 42 cc.  This case was discussed  with neuro endovascular specialist Dr. Estanislado Pandy, and because of his low stroke scale and rapidly improving symptoms, he was not deemed to be a candidate for IA therapy.  Occlusion of the left posterior cerebral artery at the junction of the P1 and P2  Left PCA distribution infarct.  Suspected Etiology: cardioembolic versus atheroembolic Resultant Symptoms: right-sided weakness, aphasia and right-sided neglect Stroke Risk Factors: hyperlipidemia and hypertension Other Stroke Risk Factors: Advanced age, Hx of Prostate Cancer  Outstanding Stroke Work-up Studies:     MRI BRAIN   07/10/2017 ASSESSMENT:   Neuro exam stable. Right sided weakness improved. Right visual field cut noted. Complaining of mild Chest pain on exam today. Admission Troponins, EKG and CXR negative. Troponin and Magnesium ordered stat. EKG - ok. Sublingual NG ordered. Will continue to monitor closely.  PLAN  07/10/2017: HOLD ASA until 24 hour post tPA neuroimaging is stable & without evidence of bleeding Continue Statin Frequent neuro checks Telemetry monitoring PT/OT/SLP May need TEE and Loop Recorder Placement - MRI imaging pending Ongoing aggressive stroke risk factor management Patient counseled to be compliant with his antithrombotic medications Patient counseled on Lifestyle modifications including, Diet, Exercise, and Stress Follow up with [facility] in 6 weeks Patient and wife instructed - no driving until cleared by PCP or Opthomology  INTRACRANIAL Atherosclerosis  &Stenosis: May need DAPT, will consider prior to discharge  R/O AFIB: May need TEE and Loop Recorder Placement, decision once MRI imaging reviewed  MEDICAL ISSUES: Chest Pain Admission troponins, EKG and chest x-ray negative Repeat troponins and magnesium within normal limits Repeat EKG stable Nitroglycerin sublingual x1 administered with good effect, no further reports of chest pain today Will continue to monitor closely Cardiology inpatient consultation Patient to follow-up with outpatient cardiology at [facility] at discharge  HYPERTENSION: Stable SBP goal less than 140/90  SBP goal of < 180. DBP goal of < 105.  Long term BP goal normotensive. May slowly restart home B/P medications after 48 hours Home Meds: Lotrel, HCTZ  HYPERLIPIDEMIA:    Component Value Date/Time   CHOL 145 07/10/2017 0431   TRIG 42 07/10/2017 0431   HDL 59 07/10/2017 0431   CHOLHDL 2.5 07/10/2017 0431   VLDL 8 07/10/2017 0431   LDLCALC 78 07/10/2017 0431  Home Meds:  Zocor 20 mg LDL  goal < 70 Increased to Zocor to 40 mg daily Continue statin at discharge  DIABETES: Lab Results  Component Value Date   HGBA1C 6.5 (H) 07/10/2017  HgbA1c goal < 7.0 Currently on: Novolog Continue CBG monitoring and SSI to maintain glucose 140-180 mg/dl DM education   Other Active Problems: Principal Problem:   Stroke (cerebrum) (HCC) Active Problems:   HLD (hyperlipidemia)   BP (high blood pressure)    Hospital day # 1 VTE prophylaxis: SCD's  Diet : Diet heart healthy/carb modified Room service appropriate? Yes; Fluid consistency: Thin   FAMILY UPDATES:  family at bedside  TEAM UPDATES: Garvin Fila, MD   Prior Home Stroke Medications:  aspirin 81 mg daily  Discharge Stroke Meds:  Please discharge patient on TBD   Disposition: [facility] Therapy Recs:               HOME WITH OUTPT PT/OT Home Equipment:         NONE Follow Up:  Follow-up Information    Garvin Fila, MD. Schedule an appointment as soon as possible for a visit in 6 week(s).   Specialties:  Neurology, Radiology Contact  information: [address] [place] [address] [phone]          Baxter Hire, MD -PCP Follow up in 1-2 weeks      Assessment & plan discussed with with attending physician and they are in agreement.    Mary Sella, ANP-C Stroke Neurology Team 07/10/2017 2:39 PM I have personally examined this patient, reviewed notes, independently viewed imaging studies, participated in medical decision making and plan of care.ROS completed by me personally and pertinent positives fully documented  I have made any additions or clarifications directly to the above note. Agree with note above.  He presented with embolic left posterior cerebral artery infarct and was treated with IV TPA. Recommend close ICU follow-up and strict blood pressure control. Continue ongoing stroke workup. Patient advised not to drive till his peripheral vision loss improves. Cardiology consult for his chest pain. This patient is critically ill and at significant risk of neurological worsening, death and care requires constant monitoring of vital signs, hemodynamics,respiratory and cardiac monitoring, extensive review of multiple databases, frequent neurological assessment, discussion with family, other specialists and medical decision making of high complexity.I have made any additions or clarifications directly to the above note.This critical care time does not reflect procedure time, or teaching time or supervisory time of PA/NP/Med Resident etc but could involve care discussion time.  I spent 35 minutes of neurocritical care time  in the care of  this patient.     Antony Contras, MD Medical Director [facility] Pager: [phone] 07/10/2017 5:36 PM  To contact Stroke Continuity provider, please refer to [email or web address] After hours, contact General Neurology

## 2017-07-10 NOTE — Progress Notes (Signed)
  Echocardiogram 2D Echocardiogram has been performed.  Jeffrey Rojas 07/10/2017, 10:40 AM

## 2017-07-10 NOTE — Evaluation (Signed)
Occupational Therapy Evaluation Patient Details Name: Jeffrey Rojas MRN: [id number] DOB: 12-26-1934 Today's Date: 07/10/2017    History of Present Illness 83 yo male admitted with L PCA CVA s/p tPA. PMHx: prostate CA, arthritis   Clinical Impression   Pt admitted with above. He demonstrates the below listed deficits and will benefit from continued OT to maximize safety and independence with BADLs.  Pt presents to OT with Rt visual field deficit, cognitive deficits including decreased orientation, attention, problem solving, and awareness.  He currently requires supervision - min A for ADLs.  He lives alone and was fully independent, PTA.   Discussed recommendation for 24 hour supervision at discharge, and pt reports he has a friend who he can stay with.   IF this is the case, then recommend OPOT.    Recommend no driving, and follow up with ophthalmologist for full visual field assessment.  Will follow acutely.        Follow Up Recommendations  Outpatient OT;Supervision/Assistance - 24 hour    Equipment Recommendations  None recommended by OT    Recommendations for Other Services       Precautions / Restrictions Precautions Precautions: None      Mobility Bed Mobility Overal bed mobility: Modified Independent                Transfers Overall transfer level: Needs assistance   Transfers: Sit to/from Stand;Stand Pivot Transfers Sit to Stand: Supervision Stand pivot transfers: Supervision       General transfer comment: due to visual deficits     Balance Overall balance assessment: No apparent balance deficits (not formally assessed)                                         ADL either performed or assessed with clinical judgement   ADL Overall ADL's : Needs assistance/impaired Eating/Feeding: Sitting;Supervision/ safety   Grooming: Wash/dry hands;Wash/dry face;Oral care;Brushing hair;Min guard;Standing   Upper Body Bathing: Set up;Supervision/  safety;Sitting   Lower Body Bathing: Min guard;Sit to/from stand   Upper Body Dressing : Minimal assistance;Sitting   Lower Body Dressing: Sit to/from stand;Minimal assistance   Toilet Transfer: Ambulation;Comfort height toilet;Grab bars;Supervision/safety   Toileting- Clothing Manipulation and Hygiene: Min guard;Sit to/from stand       Functional mobility during ADLs: Min guard       Vision Baseline Vision/History: Wears glasses Wears Glasses: Reading only Patient Visual Report: Peripheral vision impairment Vision Assessment?: Yes Eye Alignment: Within Functional Limits Ocular Range of Motion: Within Functional Limits Alignment/Gaze Preference: Within Defined Limits Tracking/Visual Pursuits: Able to track stimulus in all quads without difficulty Visual Fields: Right visual field deficit Additional Comments: Pt able to locate object on Rt during confrontation testing, however, when reading, he reports inability to see letters on the Rt - he splits words in half not seeing the Rt side of the word      Perception Perception Perception Tested?: Yes Perception Deficits: Inattention/neglect Inattention/Neglect: Does not attend to right visual field Comments: mild inattention deficit    Praxis Praxis Praxis tested?: Within functional limits    Pertinent Vitals/Pain Pain Assessment: No/denies pain     Hand Dominance Right   Extremity/Trunk Assessment Upper Extremity Assessment Upper Extremity Assessment: Overall WFL for tasks assessed   Lower Extremity Assessment Lower Extremity Assessment: Defer to PT evaluation   Cervical / Trunk Assessment Cervical / Trunk Assessment: Normal  Communication Communication Communication: No difficulties   Cognition Arousal/Alertness: Awake/alert Behavior During Therapy: WFL for tasks assessed/performed Overall Cognitive Status: Impaired/Different from baseline Area of Impairment: Attention;Memory;Following  commands;Safety/judgement;Awareness;Problem solving;Orientation                 Orientation Level: Situation Current Attention Level: Selective Memory: Decreased short-term memory Following Commands: Follows one step commands consistently;Follows multi-step commands inconsistently Safety/Judgement: Decreased awareness of safety;Decreased awareness of deficits Awareness: Intellectual Problem Solving: Requires verbal cues General Comments: Pt is unsure that he has had a stroke.  He requires cues for problem solving. Decreased awareness of deficits    General Comments  discussed need for 24 hour assist/supervision with pt.  He reports he can stay with a friend     Exercises     Shoulder [place] expects to be discharged to:: Private residence Living Arrangements: Alone Available Help at Discharge: Friend(s);Available PRN/intermittently Type of Home: House Home Access: Level entry     Home Layout: One level     Bathroom Shower/Tub: Teacher, early years/pre: Standard     Home Equipment: None          Prior Functioning/Environment Level of Independence: Independent        Comments: Pt drives         OT Problem List: Impaired vision/perception;Decreased cognition;Decreased safety awareness      OT Treatment/Interventions: Self-care/ADL training;DME and/or AE instruction;Therapeutic activities;Cognitive remediation/compensation;Visual/perceptual remediation/compensation;Patient/family education    OT Goals(Current goals can be found in the care plan section) Acute Rehab OT Goals Patient Stated Goal: to get back to normal  OT Goal Formulation: With patient Time For Goal Achievement: 07/24/17 Potential to Achieve Goals: Good ADL Goals Pt Will Perform Eating: with modified independence;sitting Pt Will Perform Grooming: with modified independence;sitting;standing Pt Will Perform Upper Body Bathing: with modified  independence;sitting;standing Pt Will Perform Lower Body Bathing: with modified independence;sit to/from stand Pt Will Perform Upper Body Dressing: with modified independence;sitting;standing Pt Will Perform Lower Body Dressing: with modified independence;sit to/from stand Pt Will Transfer to Toilet: with modified independence;ambulating;regular height toilet;grab bars Pt Will Perform Toileting - Clothing Manipulation and hygiene: with modified independence;sit to/from stand Pt Will Perform Tub/Shower Transfer: Tub transfer;with modified independence;ambulating Additional ADL Goal #1: Pt will demonstrate emergent awareness of deficits Additional ADL Goal #2: Pt will be able to read full paragraph of info with min cues to scan to the Rt.  OT Frequency: Min 2X/week   Barriers to D/C: Decreased caregiver support          Co-evaluation              AM-PAC PT "6 Clicks" Daily Activity     Outcome Measure Help from another person eating meals?: A Little Help from another person taking care of personal grooming?: A Little Help from another person toileting, which includes using toliet, bedpan, or urinal?: A Little Help from another person bathing (including washing, rinsing, drying)?: A Little Help from another person to put on and taking off regular upper body clothing?: A Little Help from another person to put on and taking off regular lower body clothing?: A Little 6 Click Score: 18   End of Session Nurse Communication: Mobility status  Activity Tolerance: Patient tolerated treatment well Patient left: in chair;with call bell/phone within reach  OT Visit Diagnosis: Cognitive communication deficit (R41.841) Symptoms and signs involving cognitive functions: Cerebral infarction  Time: 3403-5248 OT Time Calculation (min): 20 min Charges:  OT General Charges $OT Visit: 1 Visit OT Evaluation $OT Eval Moderate Complexity: 1 Mod G-Codes:     [facility],  OTR/L [phone]   Lucille Passy M 07/10/2017, 2:13 PM

## 2017-07-11 ENCOUNTER — Encounter (HOSPITAL_COMMUNITY)
Admission: EM | Disposition: A | Discharge status: Home / Self Care | Payer: Self-pay | Source: Other Acute Inpatient Hospital | Attending: Neurology

## 2017-07-11 ENCOUNTER — Inpatient Hospital Stay (HOSPITAL_COMMUNITY): Payer: Medicare Other

## 2017-07-11 ENCOUNTER — Encounter (HOSPITAL_COMMUNITY): Payer: Self-pay | Admitting: *Deleted

## 2017-07-11 DIAGNOSIS — I340 Nonrheumatic mitral (valve) insufficiency: Secondary | ICD-10-CM

## 2017-07-11 DIAGNOSIS — I6389 Other cerebral infarction: Secondary | ICD-10-CM

## 2017-07-11 DIAGNOSIS — I6300 Cerebral infarction due to thrombosis of unspecified precerebral artery: Secondary | ICD-10-CM

## 2017-07-11 DIAGNOSIS — I639 Cerebral infarction, unspecified: Secondary | ICD-10-CM

## 2017-07-11 HISTORY — PX: LOOP RECORDER INSERTION: EP1214

## 2017-07-11 HISTORY — PX: TEE WITHOUT CARDIOVERSION: SHX5443

## 2017-07-11 LAB — GLUCOSE, CAPILLARY
GLUCOSE-CAPILLARY: 120 mg/dL — AB (ref 65–99)
Glucose-Capillary: 121 mg/dL — ABNORMAL HIGH (ref 65–99)
Glucose-Capillary: 188 mg/dL — ABNORMAL HIGH (ref 65–99)

## 2017-07-11 SURGERY — LOOP RECORDER INSERTION

## 2017-07-11 SURGERY — ECHOCARDIOGRAM, TRANSESOPHAGEAL
Anesthesia: Moderate Sedation

## 2017-07-11 MED ORDER — AMLODIPINE BESYLATE 10 MG PO TABS
10.0000 mg | ORAL_TABLET | Freq: Every day | ORAL | Status: DC
  Administered 2017-07-11: 10 mg via ORAL
  Filled 2017-07-11: qty 1

## 2017-07-11 MED ORDER — BUTAMBEN-TETRACAINE-BENZOCAINE 2-2-14 % EX AERO
INHALATION_SPRAY | CUTANEOUS | Status: DC | PRN
  Administered 2017-07-11: 2 via TOPICAL

## 2017-07-11 MED ORDER — AMLODIPINE BESY-BENAZEPRIL HCL 10-20 MG PO CAPS: 1.0000 | ORAL_CAPSULE | Freq: Every day | ORAL | Status: DC

## 2017-07-11 MED ORDER — CLOPIDOGREL BISULFATE 75 MG PO TABS: 75.0000 mg | ORAL_TABLET | Freq: Every day | ORAL | Status: DC

## 2017-07-11 MED ORDER — LIDOCAINE-EPINEPHRINE 1 %-1:100000 IJ SOLN
INTRAMUSCULAR | Status: DC | PRN
  Administered 2017-07-11: 20 mL

## 2017-07-11 MED ORDER — BENAZEPRIL HCL 20 MG PO TABS
20.0000 mg | ORAL_TABLET | Freq: Every day | ORAL | Status: DC
  Administered 2017-07-11: 20 mg via ORAL
  Filled 2017-07-11: qty 1

## 2017-07-11 MED ORDER — SIMVASTATIN 40 MG PO TABS: 40.0000 mg | ORAL_TABLET | Freq: Every day | ORAL | 1 refills | Status: DC

## 2017-07-11 MED ORDER — CLOPIDOGREL BISULFATE 75 MG PO TABS: 75.0000 mg | ORAL_TABLET | Freq: Every day | ORAL | 1 refills | Status: DC

## 2017-07-11 MED ORDER — MIDAZOLAM HCL 5 MG/ML IJ SOLN
INTRAMUSCULAR | Status: AC
  Filled 2017-07-11: qty 2

## 2017-07-11 MED ORDER — LIDOCAINE-EPINEPHRINE 1 %-1:100000 IJ SOLN
INTRAMUSCULAR | Status: AC
Start: 2017-07-11 — End: 2017-07-11
  Filled 2017-07-11: qty 1

## 2017-07-11 MED ORDER — FENTANYL CITRATE (PF) 100 MCG/2ML IJ SOLN
INTRAMUSCULAR | Status: AC
  Filled 2017-07-11: qty 2

## 2017-07-11 MED ORDER — MIDAZOLAM HCL 10 MG/2ML IJ SOLN
INTRAMUSCULAR | Status: DC | PRN
  Administered 2017-07-11: 2 mg via INTRAVENOUS

## 2017-07-11 MED ORDER — NITROGLYCERIN 0.4 MG SL SUBL: 0.4000 mg | SUBLINGUAL_TABLET | SUBLINGUAL | 1 refills | Status: AC | PRN

## 2017-07-11 MED ORDER — AMLODIPINE BESYLATE 10 MG PO TABS: 10.0000 mg | ORAL_TABLET | Freq: Every day | ORAL | Status: DC

## 2017-07-11 MED ORDER — CLOPIDOGREL BISULFATE 75 MG PO TABS
75.0000 mg | ORAL_TABLET | Freq: Every day | ORAL | Status: DC
  Administered 2017-07-11: 75 mg via ORAL
  Filled 2017-07-11: qty 1

## 2017-07-11 MED ORDER — BENAZEPRIL HCL 20 MG PO TABS: 20.0000 mg | ORAL_TABLET | Freq: Every day | ORAL | Status: DC

## 2017-07-11 MED ORDER — FENTANYL CITRATE (PF) 100 MCG/2ML IJ SOLN
INTRAMUSCULAR | Status: DC | PRN
  Administered 2017-07-11: 25 ug via INTRAVENOUS

## 2017-07-11 MED ORDER — SODIUM CHLORIDE 0.9 % IV SOLN
INTRAVENOUS | Status: DC
  Administered 2017-07-11: 10:00:00 via INTRAVENOUS

## 2017-07-11 SURGICAL SUPPLY — 2 items
LOOP REVEAL LINQSYS (Prosthesis & Implant Heart) ×3 IMPLANT
PACK LOOP INSERTION (CUSTOM PROCEDURE TRAY) ×3 IMPLANT

## 2017-07-11 NOTE — Consult Note (Signed)
ELECTROPHYSIOLOGY CONSULT NOTE  Patient ID: Jeffrey Rojas MRN: [id number], DOB/AGE: 81-Dec-1936   Admit date: 07/09/2017 Date of Consult: 07/11/2017  Primary Physician: Baxter Hire, MD Primary Cardiologist: new to Dr. Oval Linsey this admission, ? Previously a cardiologist in [place], uncertain of who (nothing in [place] or care everywhere from cardiology) Reason for Consultation: Cryptogenic stroke ; recommendations regarding Implantable Loop Recorder requested by Dr. Leonie Man.  History of Present Illness Jeffrey Rojas was admitted initially to [facility], transferred to [facility] on 07/09/2017 with acute stroke, treated with tPA .  PMHx noted for HTN, HLD, prostate cancer.  They first developed symptoms while visiting with family I the hospital, found to be confused and not feeling well.  Imaging demonstrated Occlusion of the left posterior cerebral artery at the junction of the P1 and P2  Left PCA distribution infarct.  he has undergone workup for stroke including echocardiogram and carotid angio.  The patient has been monitored on telemetry which has demonstrated sinus rhythm with no arrhythmias.  Inpatient stroke work-up was completed with a TEE.   Echocardiogram this admission demonstrated   Study Conclusions - Left ventricle: The cavity size was normal. Wall thickness was   normal. Systolic function was normal. The estimated ejection   fraction was in the range of 60% to 65%. - Aortic valve: There was very mild stenosis. There was mild   regurgitation. - Mitral valve: Valve area by pressure half-time: 2.12 cm^2. - Atrial septum: No defect or patent foramen ovale was identified.  07/11/17: TEE Left Ventrical:  Normal LV function Mitral Valve: mild MR  Aortic Valve: moderate AI ( central jet )  Tricuspid Valve: trace - mild TR  Pulmonic Valve: trivial PI Left Atrium/ Left atrial appendage: no thrombi  Atrial septum: aneurismal but no evidence of ASD or PFO by color or bubble study  Aorta: mild  aortic atheroma  Complications: No apparent complications Patient did tolerate procedure well.     Lab work is reviewed.  Cardiac consult was obtained here 2/2 c/o CP, negative Trop, and reportedly a recent out patient negative stress test, his CP felt to be atypical and no further w/u was needed to f/u out patient with his cardiologist.  Prior to admission, the patient denies shortness of breath, dizziness, palpitations, or syncope. He is recovering from the stroke with disposition plans are for discharge later today potentially in d/w neurology team, the patient states he was told he was going home today.   Past Medical History:  Diagnosis Date  . Arthritis   . Cancer ([place])   . History of prostate cancer      Surgical History: History reviewed. No pertinent surgical history.   Medications Prior to Admission  Medication Sig Dispense Refill Last Dose  . amLODipine-benazepril (LOTREL) 10-20 MG capsule Take 1 capsule by mouth daily.    Taking  . aspirin EC 81 MG tablet Take 81 mg by mouth daily.    Taking  . hydrochlorothiazide (HYDRODIURIL) 25 MG tablet Take 25 mg by mouth daily.    Taking  . simvastatin (ZOCOR) 20 MG tablet Take 20 mg by mouth daily.    Taking    Inpatient Medications:  . [MAR Hold] amLODipine  10 mg Oral Daily   And  . [MAR Hold] benazepril  20 mg Oral Daily  . [MAR Hold] clopidogrel  75 mg Oral Daily  . [MAR Hold] insulin aspart  0-5 Units Subcutaneous QHS  . [MAR Hold] insulin aspart  0-9 Units Subcutaneous TID  WC  . [MAR Hold] labetalol  20 mg Intravenous Once  . [MAR Hold] simvastatin  40 mg Oral q1800    Allergies: No Known Allergies  Social History   Socioeconomic History  . Marital status: Widowed    Spouse name: Not on file  . Number of children: Not on file  . Years of education: Not on file  . Highest education level: Not on file  Social Needs  . Financial resource strain: Not on file  . Food insecurity - worry: Not on file  . Food  insecurity - inability: Not on file  . Transportation needs - medical: Not on file  . Transportation needs - non-medical: Not on file  Occupational History  . Not on file  Tobacco Use  . Smoking status: Never Smoker  . Smokeless tobacco: Never Used  Substance and Sexual Activity  . Alcohol use: Yes    Alcohol/week: 0.0 oz    Comment: ocassionally  . Drug use: No  . Sexual activity: Not on file  Other Topics Concern  . Not on file  Social History Narrative  . Not on file     Family History  Problem Relation Age of Onset  . Prostate cancer Neg Hx   . Kidney cancer Neg Hx       Review of Systems: All other systems reviewed and are otherwise negative except as noted above.  Physical Exam: Vitals:   07/11/17 0955 07/11/17 1000 07/11/17 1005 07/11/17 1010  BP: (!) 157/65 (!) 160/74 (!) 156/70 (!) 141/54  Pulse: 60 (!) 57 (!) 58 (!) 57  Resp: 12 18 17 19   Temp:      TempSrc:      SpO2: 100% 99% 100% 100%  Weight:      Height:        GEN- The patient is well appearing, alert and oriented x 3 today.   Head- normocephalic, atraumatic Eyes-  Sclera clear, conjunctiva pink Ears- hearing intact Oropharynx- clear Neck- supple Lungs- CTA b/l, normal work of breathing Heart- RRR, no murmurs, rubs or gallops  GI- soft, NT, ND Extremities- no clubbing, cyanosis, or edema MS- no significant deformity or atrophy Skin- no rash or lesion Psych- euthymic mood, full affect   Labs:   Lab Results  Component Value Date   WBC 10.7 (H) 07/09/2017   HGB 16.4 07/09/2017   HCT 49.9 07/09/2017   MCV 89.6 07/09/2017   PLT 262 07/09/2017    Recent Labs  Lab 07/09/17 1902  NA 137  K 3.5  CL 99*  CO2 27  BUN 17  CREATININE 1.16  CALCIUM 10.0  PROT 8.7*  BILITOT 0.5  ALKPHOS 94  ALT 25  AST 26  GLUCOSE 131*   Lab Results  Component Value Date   TROPONINI <0.03 07/10/2017   Lab Results  Component Value Date   CHOL 145 07/10/2017   Lab Results  Component Value  Date   HDL 59 07/10/2017   Lab Results  Component Value Date   LDLCALC 78 07/10/2017   Lab Results  Component Value Date   TRIG 42 07/10/2017   Lab Results  Component Value Date   CHOLHDL 2.5 07/10/2017   No results found for: LDLDIRECT  No results found for: DDIMER   Radiology/Studies:   Ct Angio Head W Or Wo Contrast Result Date: 07/09/2017 CLINICAL DATA:  Acute onset weakness of the right arm. Aphasia. Right visual field cut. EXAM: CT ANGIOGRAPHY HEAD AND NECK CT PERFUSION BRAIN TECHNIQUE:  Multidetector CT imaging of the head and neck was performed using the standard protocol during bolus administration of intravenous contrast. Multiplanar CT image reconstructions and MIPs were obtained to evaluate the vascular anatomy. Carotid stenosis measurements (when applicable) are obtained utilizing NASCET criteria, using the distal internal carotid diameter as the denominator. Multiphase CT imaging of the brain was performed following IV bolus contrast injection. Subsequent parametric perfusion maps were calculated using RAPID software. CONTRAST:  168mL ISOVUE-370 IOPAMIDOL (ISOVUE-370) INJECTION 76% COMPARISON:  Head CT 07/09/2017 FINDINGS: CTA NECK FINDINGS Aortic arch: There is mild calcific atherosclerosis of the aortic arch. There is no aneurysm, dissection or hemodynamically significant stenosis of the visualized ascending aorta and aortic arch. Conventional 3 vessel aortic branching pattern. The visualized proximal subclavian arteries are normal. Right carotid system: The right common carotid origin is widely patent. There is no common carotid or internal carotid artery dissection or aneurysm. No hemodynamically significant stenosis. Left carotid system: The left common carotid origin is widely patent. There is no common carotid or internal carotid artery dissection or aneurysm. Atherosclerotic calcification at the carotid bifurcation without hemodynamically significant stenosis. Vertebral  arteries: The vertebral system is left dominant. Both vertebral artery origins are normal. Both vertebral arteries are normal to their confluence with the basilar artery. Skeleton: There is no bony spinal canal stenosis. No lytic or blastic lesions. Other neck: The nasopharynx is clear. The oropharynx and hypopharynx are normal. The epiglottis is normal. The supraglottic larynx, glottis and subglottic larynx are normal. No retropharyngeal collection. The parapharyngeal spaces are preserved. The parotid and submandibular glands are normal. No sialolithiasis or salivary ductal dilatation. The thyroid gland is normal. There is no cervical lymphadenopathy. Upper chest: No pneumothorax or pleural effusion. No nodules or masses. Review of the MIP images confirms the above findings CTA HEAD FINDINGS Anterior circulation: --Intracranial internal carotid arteries: Atherosclerotic calcification without significant stenosis. --Anterior cerebral arteries: Absent right A1 segment, a normal variant. Normal left ACA. --Middle cerebral arteries: Normal. --Posterior communicating arteries: Absent bilaterally. Posterior circulation: --Posterior cerebral arteries: The left posterior cerebral artery is occluded at the P1-P2 junction. Normal right posterior cerebral artery. --Superior cerebellar arteries: Normal. --Basilar artery: Normal. --Anterior inferior cerebellar arteries: Normal. --Posterior inferior cerebellar arteries: Normal. Venous sinuses: As permitted by contrast timing, patent. Anatomic variants: None Delayed phase: No parenchymal contrast enhancement. Review of the MIP images confirms the above findings CT Brain Perfusion Findings: CBF (<30%) Volume: 10mL Perfusion (Tmax>6.0s) volume: 17mL Mismatch Volume: 36mL Infarction Location:Left occipital lobe IMPRESSION: 1. Occlusion of the left posterior cerebral artery at the junction of the P1 and P2 segments with associated left PCA distribution infarct. 2. Left PCA infarct  volume measuring 25 mL with 17 mL of penumbra. 3. No other intracranial occlusion or high-grade stenosis. 4. Aortic Atherosclerosis (ICD10-I70.0). No hemodynamically significant stenosis of the carotid or vertebral arteries. These results were communicated to Dr. Rory Percy at 9:03 pm on 07/09/2017 by text page via the [facility] messaging system. Electronically Signed   By: Ulyses Jarred M.D.   On: 07/09/2017 21:04    Dg Chest 2 View Result Date: 07/10/2017 CLINICAL DATA:  Stroke EXAM: CHEST  2 VIEW COMPARISON:  None. FINDINGS: Top-normal sized heart. Tortuous atherosclerotic aorta without aneurysm. Atelectasis and/or scarring at the left lung base. No overt pulmonary edema. No effusion. Thoracic degenerative disc disease with osteophytes. No acute osseous abnormality. IMPRESSION: No active cardiopulmonary disease. Tortuous atherosclerotic aorta without aneurysm. Electronically Signed   By: Ashley Royalty M.D.   On: 07/10/2017 03:04  Mr Brain Wo Contrast Result Date: 07/10/2017 CLINICAL DATA:  Follow-up examination for stroke. EXAM: MRI HEAD WITHOUT CONTRAST TECHNIQUE: Multiplanar, multiecho pulse sequences of the brain and surrounding structures were obtained without intravenous contrast. COMPARISON:  Prior CT from 07/09/2016. FINDINGS: Brain: Generalized age-related cerebral atrophy. Patchy T2/FLAIR hyperintensity within the periventricular and deep white matter both cerebral hemispheres most consistent with chronic small vessel ischemic disease, mild for age. Moderate to large evolving acute left PCA territory infarct involving the parasagittal left occipital and temporal lobes. No significant mass effect. Mild scattered petechial hemorrhage without hemorrhagic transformation. No other evidence for acute or subacute ischemia. Gray-white matter differentiation otherwise maintained. No other evidence for chronic infarction. No other acute or chronic intracranial hemorrhage. No mass lesion, midline shift or mass effect.  No hydrocephalus. No extra-axial fluid collection. Major dural sinuses are grossly patent. Pituitary gland suprasellar region normal. Midline structures intact and normal. Vascular: Major intracranial vascular flow voids are maintained at the skull base. Known left PCA occlusion not well seen. Skull and upper cervical spine: Craniocervical junction normal. Mild multilevel degenerative spondylolysis noted within the upper cervical spine without significant stenosis. Bone marrow signal intensity heterogeneous but within normal limits. No discrete osseous lesions. No scalp soft tissue abnormality. Sinuses/Orbits: Globes and orbital soft tissues within normal limits. Paranasal sinuses are clear. Trace right mastoid effusion noted, of doubtful significance. Inner ear structures grossly normal. Other: None IMPRESSION: 1. Moderate to large evolving acute ischemic left PCA territory infarct without significant mass effect. Associated mild petechial hemorrhage without frank hemorrhagic transformation. 2. Mild chronic microvascular ischemic disease for age. Electronically Signed   By: Jeannine Boga M.D.   On: 07/10/2017 19:58     Ct Head Code Stroke Wo Contrast` Result Date: 07/09/2017 CLINICAL DATA:  Code stroke.  Ataxia and slurred speech. EXAM: CT HEAD WITHOUT CONTRAST TECHNIQUE: Contiguous axial images were obtained from the base of the skull through the vertex without intravenous contrast. COMPARISON:  None. FINDINGS: Brain: No mass lesion or acute hemorrhage. No focal hypoattenuation of the basal ganglia or cortex to indicate infarcted tissue. No hydrocephalus or age advanced atrophy. Vascular: No hyperdense vessel. No advanced atherosclerotic calcification of the arteries at the skull base. Skull: Normal visualized skull base, calvarium and extracranial soft tissues. Sinuses/Orbits: No sinus fluid levels or advanced mucosal thickening. No mastoid effusion. Normal orbits. ASPECTS [facility] Stroke Program Early  CT Score) - Ganglionic level infarction (caudate, lentiform nuclei, internal capsule, insula, M1-M3 cortex): 7 - Supraganglionic infarction (M4-M6 cortex): 3 Total score (0-10 with 10 being normal): 10 IMPRESSION: 1. No acute hemorrhage or mass lesion. 2. ASPECTS is 10. These results were called by telephone at the time of interpretation on 07/09/2017 at 7:01 pm to Dr. Charlotte Crumb , who verbally acknowledged these results. Electronically Signed   By: Ulyses Jarred M.D.   On: 07/09/2017 19:02    12-lead ECG SR All prior EKG's in EPIC reviewed with no documented atrial fibrillation  Telemetry SR  Assessment and Plan:  1. Cryptogenic stroke The patient presents with cryptogenic stroke.  The patient had a TEE earlier today without cardiac source for stroke found.  I spoke at length with the patient about monitoring for afib with either a 30 day event monitor or an implantable loop recorder.   He has a "very close friend" at bedside who he discussed with as well.   Risks, benefits, and alteratives to implantable loop recorder were discussed with the patient and his friend at bedside today.  At this time, the patient is able to state understanding of the procedure and in discussion with his friend,  very clear in his decision to proceed with implantable loop recorder.   Wound care was reviewed with the patient (keep incision clean and dry for 3 days).  Wound check Taygen Newsome be scheduled for the patient.  The patient was recently seen by a cardiologist in [place], we discussed the option of doing nothing and having him follow up with his cardiologist (Dr. Verner Mould) for his recommendations and follow up, though at this time, he prefers to have the monitor implant and proceed with [place] care for follow up.  His friend would prefer Dr. Caryl Comes who she has apparently had interaction with or knows of him.    Please call with questions.   Baldwin Jamaica, PA-C 07/11/2017  I have seen and examined  this patient with Tommye Standard.  Agree with above, note added to reflect my findings.  On exam, RRR, no murmurs, lungs clear.   Patient presented to hospital initially with cryptogenic stroke.  TEE without any findings and no further cause has been discovered.  We Sircharles Holzheimer plan for link implantation.  Risks and benefits include bleeding and infection.  The patient understands the risks and is agreed to the procedure.  Oluwanifemi Petitti M. Jourdan Durbin MD 07/11/2017 2:58 PM

## 2017-07-11 NOTE — Progress Notes (Signed)
Pt was transferred from 4N-ICU to 3W04. Pt is alert and oriented x4. Vital signs stable, denies pain and no skin issues noted. Will continue to monitor pt.

## 2017-07-11 NOTE — CV Procedure (Signed)
    Transesophageal Echocardiogram Note  Jader Desai [id number] 1934/08/24  Procedure: Transesophageal Echocardiogram Indications: CVA   Procedure Details Consent: Obtained Time Out: Verified patient identification, verified procedure, site/side was marked, verified correct patient position, special equipment/implants available, Radiology Safety Procedures followed,  medications/allergies/relevent history reviewed, required imaging and test results available.  Performed  Medications:  During this procedure the patient is administered a total of Versed 3  mg and Fentanyl 25  mcg  to achieve and maintain moderate conscious sedation.  The patient's heart rate, blood pressure, and oxygen saturation are monitored continuously during the procedure. The period of conscious sedation is 30 minutes, of which I was present face-to-face 100% of this time.  Left Ventrical:  Normal LV function  Mitral Valve: mild MR   Aortic Valve: moderate AI ( central jet )   Tricuspid Valve: trace - mild TR   Pulmonic Valve: trivial PI  Left Atrium/ Left atrial appendage: no thrombi   Atrial septum: aneurismal but no evidence of ASD or PFO by color or bubble study   Aorta: mild aortic atheroma    Complications: No apparent complications Patient did tolerate procedure well.   Thayer Headings, Brooke Bonito., MD, [facility] 07/11/2017, 10:33 AM

## 2017-07-11 NOTE — Discharge Summary (Signed)
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 Arthritis   . Cancer ([place])   . History of prostate cancer   . Stroke [facility]) 07/2017   Past Surgical History:  Procedure Laterality Date  . NO PAST SURGERIES     Allergies as of 07/11/2017   No Known Allergies     Medication List    STOP taking these medications   aspirin EC 81 MG tablet   hydrochlorothiazide 25 MG tablet Commonly known as:  HYDRODIURIL     TAKE these medications   amLODipine-benazepril 10-20 MG capsule Commonly known as:  LOTREL Take 1 capsule by mouth daily.   clopidogrel 75 MG tablet Commonly known as:  PLAVIX Take 1 tablet (75 mg total) by mouth daily.   nitroGLYCERIN 0.4 MG SL tablet Commonly known as:  NITROSTAT Place 1 tablet (0.4 mg total) under the tongue every 5 (five) minutes as needed for chest pain (notify PCP if experiencing chest pain and need to take Nitroglycerin).   simvastatin 40 MG tablet Commonly known as:  ZOCOR Take 1 tablet (40 mg total) by mouth daily at 6 PM. What changed:    medication strength  how much to take  when to take this      LABORATORY STUDIES CBC    Component Value Date/Time   WBC 10.7 (H) 07/09/2017 1902   RBC 5.57 07/09/2017 1902   HGB 16.4 07/09/2017 1902   HCT 49.9 07/09/2017 1902   PLT 262 07/09/2017 1902   MCV 89.6 07/09/2017 1902   MCH 29.5 07/09/2017 1902   MCHC 32.9 07/09/2017 1902   RDW  15.5 (H) 07/09/2017 1902   LYMPHSABS 4.2 (H) 07/09/2017 1902   MONOABS 0.8 07/09/2017 1902   EOSABS 0.0 07/09/2017 1902   BASOSABS 0.1 07/09/2017 1902   CMP    Component Value Date/Time   NA 137 07/09/2017 1902   K 3.5 07/09/2017 1902   CL 99 (L) 07/09/2017 1902   CO2 27 07/09/2017 1902   GLUCOSE 131 (H) 07/09/2017 1902   BUN 17 07/09/2017 1902   CREATININE 1.16 07/09/2017 1902   CALCIUM 10.0 07/09/2017 1902   PROT 8.7 (H) 07/09/2017 1902   ALBUMIN 4.6 07/09/2017 1902   AST 26 07/09/2017 1902   ALT 25 07/09/2017 1902   ALKPHOS 94 07/09/2017 1902   BILITOT 0.5 07/09/2017 1902   GFRNONAA 58 (L) 07/09/2017 1902   GFRAA >60 07/09/2017 1902   COAGS Lab Results  Component Value Date   INR 1.03 07/09/2017   Lipid Panel    Component Value Date/Time   CHOL 145 07/10/2017 0431   TRIG 42 07/10/2017 0431   HDL 59 07/10/2017 0431   CHOLHDL 2.5 07/10/2017 0431   VLDL 8 07/10/2017 0431   LDLCALC 78 07/10/2017 0431   HgbA1C  Lab Results  Component Value Date   HGBA1C 6.5 (H) 07/10/2017   SIGNIFICANT DIAGNOSTIC STUDIES Ct Angio Head Neck W  Or Wo Contrast with Perfusion Result Date: 07/09/2017 IMPRESSION: 1. Occlusion of the left posterior cerebral artery at the junction of the P1 and P2 segments with associated left PCA distribution infarct. 2. Left PCA infarct volume measuring 25 mL with 17 mL of penumbra. 3. No other intracranial occlusion or high-grade stenosis. 4. Aortic Atherosclerosis (ICD10-I70.0). No hemodynamically significant stenosis of the carotid or vertebral arteries.   Dg Chest 2 View Result Date: 07/10/2017 IMPRESSION: No active cardiopulmonary disease. Tortuous atherosclerotic aorta without aneurysm.   Ct Head Code Stroke Wo Contrast` Result Date: 07/09/2017 IMPRESSION: 1. No acute hemorrhage or mass lesion. 2. ASPECTS is 10.   Echocardiogram:                                               Study Conclusions - Left ventricle: The cavity size was normal. Wall  thickness was normal. Systolic function was normal. The estimated ejection fraction was in the range of 60% to 65%. - Aortic valve: There was very mild stenosis. There was mild regurgitation. - Mitral valve: Valve area by pressure half-time: 2.12 cm^2. - Atrial septum: No defect or patent foramen ovale was identified.  MRI BRAIN                                               IMPRESSION: 1. Moderate to large evolving acute ischemic left PCA territory infarct without significant mass effect. Associated mild petechial hemorrhage without frank hemorrhagic transformation. 2. Mild chronic microvascular ischemic disease for age.  TEE    07/11/2017 Left Ventrical:  Normal LV function Mitral Valve: mild MR  Aortic Valve: moderate AI ( central jet )  Tricuspid Valve: trace - mild TR  Pulmonic Valve: trivial PI Left Atrium/ Left atrial appendage: no thrombi  Atrial septum: aneurismal but no evidence of ASD or PFO by color or bubble study  Aorta: mild aortic atheroma   HISTORY OF PRESENT ILLNESS   &   HOSPITAL COURSE IMPRESSION: [name] is a 83 y.o. male with PMH of 83 year old man with a past medical history of prostate cancer last seen normal at 1840 hrs. on 07/09/2017, brought in to the emergency room at [facility] for not feeling right and being confused.  He was noted to have right-sided weakness, aphasia and right-sided neglect on initial exam with an NIH stroke scale of 9 by telemedicine neurology.  He was given IV TPA for these symptoms suspected to be of acute ischemic stroke. He was transferred to [facility] for higher level of care after TPA administration. His CT angiogram of the head and neck and CT perfusion study showed a left P1 occlusion.  The perfusion study showed an area of core around 25 cc an area at risk of 42 cc.  This case was discussed with neuro endovascular specialist Dr. Estanislado Pandy, and because of his low stroke scale and rapidly improving symptoms, he  was not deemed to be a candidate for IA therapy.  Occlusion of the left posterior cerebral artery at the junction of the P1 and P2  Left PCA distribution infarct.  Suspected Etiology: cardioembolic versus atheroembolic Resultant Symptoms: right-sided weakness, aphasia and right-sided neglect Stroke Risk Factors: hyperlipidemia and hypertension Other Stroke Risk Factors: Advanced age, Hx  of Prostate Cancer  Outstanding Stroke Work-up Studies:     MRI BRAIN   07/10/2017 ASSESSMENT:   Neuro exam stable. Right sided weakness improved. Right visual field cut noted. Complaining of mild Chest pain on exam today. Admission Troponins, EKG and CXR negative. Troponin and Magnesium ordered stat. EKG - ok. Sublingual NG ordered. Will continue to monitor closely.  PLAN  07/10/2017: Continue Plavix and Statin Outpatient PT/OT/SLP Ongoing aggressive stroke risk factor management Patient counseled to be compliant withhisantithrombotic medications Patient counseled on Lifestyle modifications including, Diet, Exercise, and Stress Follow up with [facility] in 6 weeks Patient and wife instructed - no driving until cleared by PCP or Opthomology  INTRACRANIAL Atherosclerosis &Stenosis: ASA discontinued and patient put on Plavix daily  R/O AFIB: TEE results negative for acute findings Loop Recorder Placement 07/11/2017  MEDICAL ISSUES: Chest Pain Admission troponins, EKG and chest x-ray negative Repeat troponins and magnesium within normal limits Repeat EKG stable Nitroglycerin sublingual x1 administered with good effect, no further reports of chest pain Cardiology inpatient consultation Patient to follow-up with outpatient cardiology at [facility] regional at discharge Nitroglycerin Rx for PRN chest pain  HYPERTENSION: Stable Long term BP goal normotensive. Home Meds: Lotrel restarted and HCTZ discontinued Close PCP follow up  HYPERLIPIDEMIA: Labs(Brief)           Component Value Date/Time   CHOL 145 07/10/2017 0431   TRIG 42 07/10/2017 0431   HDL 59 07/10/2017 0431   CHOLHDL 2.5 07/10/2017 0431   VLDL 8 07/10/2017 0431   LDLCALC 78 07/10/2017 0431    Home Meds:  Zocor 20 mg LDL  goal < 70 Increased to Zocor to 40 mg daily Continue statin at discharge  PRE- DIABETES: RecentLabs       Lab Results  Component Value Date   HGBA1C 6.5 (H) 07/10/2017    HgbA1c goal < 7.0 DM education  Close follow up with PCP  DISCHARGE EXAM Blood pressure 127/75, pulse (!) 54, temperature 97.8 F (36.6 C), temperature source Oral, resp. rate 15, height 5\' 9"  (1.753 m), weight 70.9 kg (156 lb 4.9 oz), SpO2 100 %. General - Well nourished, well developed, in no apparent distress HEENT-  Normocephalic, Normal external eye/conjunctiva.  Normal external ears. Normal external nose, mucus membranes and septum.   Cardiovascular - Regular rate and rhythm  Respiratory - Lungs clear bilaterally. No wheezing. Abdomen - soft and non-tender, BS normal Extremities- no edema or cyanosis NEURO:  Mental Status: AA&Ox3  Language: speech is clear.  Naming, repetition, fluency, and comprehension intact but poor short term memory on bedside testing Cranial Nerves: PERRL. EOMI, visual fields- right  Dense HH, no facial asymmetry, facial sensation intact, hearing intact, tongue/uvula/soft palate midline, norma sternocleidomastoid and trapezius muscle strength. No evidence of tongue atrophy or fibrillations Motor: 5/5 all over - no drift Tone: is normal and bulk is normal Sensation- Intact to light touch bilaterally - no extinction Coordination: FTN intact bilaterally, no ataxia in BLE. Gait- deferred  Discharge Diet   Diet Heart Room service appropriate? Yes; Fluid consistency: Thin liquids  DISCHARGE PLAN  Disposition:  Home with outpatient PT/OT/SLP  clopidogrel 75 mg daily for secondary stroke prevention.  Ongoing risk factor control by Primary Care  Physician at time of discharge  Follow-up Jeffrey Hire, MD in 2 weeks.  Follow-up with Dr. Antony Contras, Stroke Clinic in 6 weeks, office to schedule an appointment.  Greater than 30 minutes were spent preparing discharge.  Mary Sella, ANP-C  Stroke Neurology Team 07/11/2017 3:43 PM

## 2017-07-11 NOTE — Progress Notes (Signed)
Discharge instructions given. Pt verbalized understanding and all questions were answered.  

## 2017-07-11 NOTE — Discharge Instructions (Signed)
Monitor implant site care °Keep incision clean and dry for 3 days. °You can remove outer dressing tomorrow. °Leave steri-strips (little pieces of tape) on until seen in the office for wound check appointment. °Call the office ([phone]) for redness, drainage, swelling, or fever. ° ° ° °

## 2017-07-11 NOTE — Progress Notes (Signed)
    [facility] has been requested to perform a transesophageal echocardiogram on [name] for stroke.  After careful review of history and examination, the risks and benefits of transesophageal echocardiogram have been explained including risks of esophageal damage, perforation (1:10,000 risk), bleeding, pharyngeal hematoma as well as other potential complications associated with conscious sedation including aspiration, arrhythmia, respiratory failure and death. Alternatives to treatment were discussed, questions were answered. Patient is willing to proceed.  TEE - Dr. Acie Fredrickson today @ 10am. Keep NPO. Meds with sips.   Leanor Kail, PA-C 07/11/2017 8:57 AM

## 2017-07-11 NOTE — Interval H&P Note (Signed)
History and Physical Interval Note:  07/11/2017 10:08 AM  Jeffrey Rojas  has presented today for surgery, with the diagnosis of stroke  The various methods of treatment have been discussed with the patient and family. After consideration of risks, benefits and other options for treatment, the patient has consented to  Procedure(s): TRANSESOPHAGEAL ECHOCARDIOGRAM (TEE) (N/A) as a surgical intervention .  The patient's history has been reviewed, patient examined, no change in status, stable for surgery.  I have reviewed the patient's chart and labs.  Questions were answered to the patient's satisfaction.     [name]

## 2017-07-11 NOTE — Care Management Note (Signed)
Case Management Note  Patient Details  Name: [name] MRN: [id number] Date of Birth: 07-06-1934  Subjective/Objective:                    Action/Plan: CM consulted for outpatient OT/ST. CM met with the patient and he would like to go to rehab at [facility] rehab. Orders in Epic and information on the AVS. Pt is going to stay with a friend of his at d/c at least for several days. She is going to provide transportation home.   Expected Discharge Date:  07/11/17               Expected Discharge Plan:  OP Rehab  In-House Referral:     Discharge planning Services  CM Consult  Post Acute Care Choice:    Choice offered to:     DME Arranged:    DME Agency:     HH Arranged:    HH Agency:     Status of Service:  Completed, signed off  If discussed at [employer] of Stay Meetings, dates discussed:    Additional Comments:  Pollie Friar, RN 07/11/2017, 1:10 PM

## 2017-07-11 NOTE — Plan of Care (Signed)
  RD consulted for nutrition education regarding diabetes.   Lab Results  Component Value Date   HGBA1C 6.5 (H) 07/10/2017    Spoke with patient and wife at bedside. Patient cooks most of his meals. Normally eats oatmeal or cereal for breakfast, coffee, and cranberry juice. Will eat a 2 vegetable plate with meat for lunch and often snacks for dinner. Does not eat many sweets, wife iterates patient consuming a lot of potatoes, and beans, discussed portion sizes with patient and wife. Questions answered. Wife and patient verbalize understand  RD provided "Carbohydrate Counting for People with Diabetes" handout from the Academy of [employer] and Dietetics. Discussed different food groups and their effects on blood sugar, emphasizing carbohydrate-containing foods. Provided list of carbohydrates and recommended serving sizes of common foods.  Discussed importance of controlled and consistent carbohydrate intake throughout the day. Provided examples of ways to balance meals/snacks and encouraged intake of high-fiber, whole grain complex carbohydrates. Teach back method used.  Expect fair compliance.  Body mass index is 23.08 kg/m. Pt meets criteria for normal weight based on current BMI.  Current diet order is carb mod, patient is consuming approximately 90% of meals at this time. Labs and medications reviewed. No further nutrition interventions warranted at this time. RD contact information provided. If additional nutrition issues arise, please re-consult RD.  Jeffrey Anis. Custer Pimenta, MS, RD LDN Inpatient Clinical Dietitian Pager [phone]

## 2017-07-11 NOTE — Progress Notes (Signed)
OT Cancellation Note  Patient Details Name: Jeffrey Rojas MRN: [id number] DOB: 1934-07-29   Cancelled Treatment:    Reason Eval/Treat Not Completed: Other (comment)(Pt transferring units ).  Will reattempt.  [place], OTR/L [phone]   Lucille Passy M 07/11/2017, 12:57 PM

## 2017-07-12 ENCOUNTER — Encounter (HOSPITAL_COMMUNITY): Payer: Self-pay | Admitting: Cardiology

## 2017-07-17 ENCOUNTER — Ambulatory Visit: Payer: Medicare Other | Admitting: Speech Pathology

## 2017-07-17 ENCOUNTER — Encounter: Payer: Self-pay | Admitting: Occupational Therapy

## 2017-07-17 ENCOUNTER — Ambulatory Visit: Payer: Medicare Other | Attending: Neurology | Admitting: Physical Therapy

## 2017-07-17 ENCOUNTER — Other Ambulatory Visit: Payer: Self-pay

## 2017-07-17 ENCOUNTER — Ambulatory Visit: Payer: Medicare Other | Admitting: Occupational Therapy

## 2017-07-17 DIAGNOSIS — R4701 Aphasia: Secondary | ICD-10-CM | POA: Insufficient documentation

## 2017-07-17 DIAGNOSIS — I639 Cerebral infarction, unspecified: Secondary | ICD-10-CM

## 2017-07-17 DIAGNOSIS — R41841 Cognitive communication deficit: Secondary | ICD-10-CM | POA: Diagnosis present

## 2017-07-17 DIAGNOSIS — M6281 Muscle weakness (generalized): Secondary | ICD-10-CM | POA: Insufficient documentation

## 2017-07-17 DIAGNOSIS — H5340 Unspecified visual field defects: Secondary | ICD-10-CM

## 2017-07-17 NOTE — Therapy (Signed)
[place] MAIN [facility] SERVICES [address] [place], [place], [address] Phone: [phone]   Fax:  [phone]  Occupational Therapy Treatment  Patient Details  Name: Jeffrey Rojas MRN: [id number] Date of Birth: 1935-05-09 Referring Provider: Dr. Leonie Man   Encounter Date: 07/17/2017  OT End of Session - 07/17/17 1728    Visit Number  1    Number of Visits  24    Date for OT Re-Evaluation  10/02/17    OT Start Time  1445    OT Stop Time  1530    OT Time Calculation (min)  45 min    Activity Tolerance  Patient tolerated treatment well    Behavior During Therapy  [facility] for tasks assessed/performed       Past Medical History:  Diagnosis Date  . Arthritis   . Cancer ([place])   . History of prostate cancer   . Stroke [facility]) 07/2017    Past Surgical History:  Procedure Laterality Date  . LOOP RECORDER INSERTION N/A 07/11/2017   Procedure: LOOP RECORDER INSERTION;  Surgeon: Constance Haw, MD;  Location: [place] CV LAB;  Service: Cardiovascular;  Laterality: N/A;  . NO PAST SURGERIES    . TEE WITHOUT CARDIOVERSION N/A 07/11/2017   Procedure: TRANSESOPHAGEAL ECHOCARDIOGRAM (TEE);  Surgeon: Acie Fredrickson Wonda Cheng, MD;  Location: [facility] ENDOSCOPY;  Service: Cardiovascular;  Laterality: N/A;    There were no vitals filed for this visit.  Subjective Assessment - 07/17/17 1720    Subjective   Pt.'s friend reports she may that they may need to change the appointment this Friday.    Pertinent History  Pt. is an 83 y.o. male who was was admitted to [facility] on 07/09/2017 with and CVA. Pt. sustained a left posterior cerebral artery embolic infarct. Pt. PMHx includes: arthtritis, CA, Atypical Chest Pain, and CVA.    Limitations  Visual, and cognitive impairments    Patient Stated Goals  To be able to see more on his right side.    Currently in Pain?  Yes    Pain Score  8     Pain Location  Chest    Pain Type  Chronic pain         [facility] OT Assessment -  07/17/17 0001      Assessment   Medical Diagnosis  CVA    Referring Provider  Dr. Leonie Man    Onset Date/Surgical Date  07/09/17    Hand Dominance  Right      Balance Screen   Has the patient fallen in the past 6 months  No    Has the patient had a decrease in activity level because of a fear of falling?   No    Is the patient reluctant to leave their home because of a fear of falling?   No      Home  Environment   Family/patient expects to be discharged to:  Private residence    Living Arrangements  Alone    Available Help at Discharge  Friend(s)    Type of [place]  One level    Bathroom Shower/Tub  Tub/Shower unit;Door    Acupuncturist    Lives With  Alone      Prior Function   Level of Independence  Independent    Vocation  Retired    Leisure  -- travelling, baseball games  ADL   Eating/Feeding  Independent    Grooming  Independent    Upper Body Bathing  Independent    Lower Body Bathing  Independent    Upper Body Dressing  Independent    Lower Body Dressing  Independent    Toilet Transfer  Independent    Toileting -  Hygiene  Independent    Tub/Shower Transfer  Independent      IADL   Shopping  Needs to be accompanied on any shopping trip    Light Housekeeping  Needs help with all home maintenance tasks    Meal Prep  Able to complete simple warm meal prep    Medication Management  Has difficulty remembering to take medication;Is not capable of dispensing or managing own medication    Financial Management  Requires assistance      Written Expression   Dominant Hand  Right    Handwriting  75% legible For name only      Vision - History   Baseline Vision  No visual deficits    Patient Visual Report  -- Pt. reports he can see 75%, missing items on right     Additional Comments  Vision to be further assessed.      Vision Assessment   Tracking/Visual Pursuits  Able to track stimulus in all quads without  difficulty    Visual Fields  Right visual field deficit      Activity Tolerance   Activity Tolerance  Tolerates 10-20 min activity with multiple rests      Cognition   Overall Cognitive Status  Impaired/Different from baseline      Sensation   Light Touch  Appears Intact    Proprioception  Appears Intact      Coordination   Right 9 Hole Peg Test  30    Left 9 Hole Peg Test  40      AROM   Overall AROM Comments  Left shoulder abduction 82, Flexion: 86 Previous shoulder limitations.      Hand Function   Right Hand Grip (lbs)  68    Right Hand Lateral Pinch  20 lbs    Right Hand 3 Point Pinch  17 lbs    Left Hand Grip (lbs)  65    Left Hand Lateral Pinch  16 lbs    Left 3 point pinch  9 lbs                       OT Education - 07/17/17 1727    Education provided  Yes    Education Details  OT services, goals    Person(s) Educated  Patient    Methods  Explanation;Demonstration;Tactile cues;Verbal cues    Comprehension  Verbalized understanding;Returned demonstration;Verbal cues required;Need further instruction         OT Long Term Goals - 07/17/17 1751      OT LONG TERM GOAL #1   Title  Pt. will be able to independently scan his environment to be abe to navigate from one place to another within it.     Baseline  Pt. has difficulty    Time  12    Period  Weeks    Status  New    Target Date  10/02/17      OT LONG TERM GOAL #2   Title  Pt. will be able to independently scan, and perform visual search startegies for tabletop IADL paperwork tasks.    Baseline  Pt. has difficulty  Time  12    Period  Weeks    Target Date  10/02/17      OT LONG TERM GOAL #3   Title  Pt. will demonstrate visual compensatory strategies 100% of the time duirng ADLs, and IADLs, with cues.    Baseline  Pt. has difficulty    Time  12    Period  Weeks    Status  New    Target Date  10/02/17      OT LONG TERM GOAL #4   Title  Pt. will demonstrate cognitive  compensatory strategies 100% of the time during ADLs, and IADLs with cues.     Baseline  Pt. has difficulty    Time  12    Period  Weeks    Status  New    Target Date  10/02/17      OT LONG TERM GOAL #5   Title  Pt. will demonstrate independence with light meal preparation.    Baseline  Pt. has difficulty    Time  12    Period  Weeks    Status  New    Target Date  10/02/17      Long Term Additional Goals   Additional Long Term Goals  Yes      OT LONG TERM GOAL #6   Title  Pt. will demonstarte writing with 100% legibility    Baseline  Pt. has difficulty    Time  12    Period  Weeks    Status  New    Target Date  10/02/17            Plan - 07/17/17 1729    Clinical Impression Statement  Pt. is an 83 y.o. male who was admitted to [facility] with a CVA. Pt. presents with right sided visual impairments, cognitive impairments, and limited activity tolerance which affect his ability to perfrom daily ADL, and IADL tasks including:  house hold tasks, cooking, driving, monthly bills, paperwork,. Pt. will benefit from skilled OT services for ADL training, visual compensatory, and cognitive compensatory strategies during ADLs, and IADLs.     Occupational performance deficits (Please refer to evaluation for details):  ADL's    Rehab Potential  Good    OT Frequency  2x / week    OT Duration  12 weeks    OT Treatment/Interventions  Self-care/ADL training;Cognitive remediation/compensation;Visual/perceptual remediation/compensation;Patient/family education;Energy conservation;Therapeutic exercise;DME and/or AE instruction    Clinical Decision Making  Several treatment options, min-mod task modification necessary    Recommended Other Services  Speech Therapy    Consulted and Agree with Plan of Care  Patient       Patient will benefit from skilled therapeutic intervention in order to improve the following deficits and impairments:  Pain, Impaired vision/preception, Decreased cognition,  Decreased activity tolerance, Decreased range of motion, Decreased strength  Visit Diagnosis: Muscle weakness (generalized)  Cerebrovascular accident (CVA), unspecified mechanism ([place])  Cognitive communication deficit  Visual field defect    Problem List Patient Active Problem List   Diagnosis Date Noted  . Atypical chest pain   . Stroke (cerebrum) ([place]) 07/09/2017  . HLD (hyperlipidemia) 11/17/2015  . H/O malignant neoplasm of prostate 11/15/2015  . BP (high blood pressure) 05/01/2014    Harrel Carina, MS, OTR/L 07/17/2017, 5:56 PM  [place] MAIN [facility] SERVICES [address] [place], [place], [address] Phone: [phone]   Fax:  [phone]  Name: Jeffrey Rojas MRN: [id number] Date of Birth: Jan 08, 1935

## 2017-07-17 NOTE — Therapy (Signed)
[place] MAIN [facility] SERVICES [place], [place], [address] Phone: [phone]   Fax:  [phone]  Physical Therapy Evaluation  Patient Details  Name: Jeffrey Rojas MRN: [id number] Date of Birth: Apr 15, 1935 No Data Recorded  Encounter Date: 07/17/2017    Past Medical History:  Diagnosis Date  . Arthritis   . Cancer ([place])   . History of prostate cancer   . Stroke [facility]) 07/2017    Past Surgical History:  Procedure Laterality Date  . LOOP RECORDER INSERTION N/A 07/11/2017   Procedure: LOOP RECORDER INSERTION;  Surgeon: Constance Haw, MD;  Location: [place] CV LAB;  Service: Cardiovascular;  Laterality: N/A;  . NO PAST SURGERIES    . TEE WITHOUT CARDIOVERSION N/A 07/11/2017   Procedure: TRANSESOPHAGEAL ECHOCARDIOGRAM (TEE);  Surgeon: Acie Fredrickson Wonda Cheng, MD;  Location: [facility] ENDOSCOPY;  Service: Cardiovascular;  Laterality: N/A;    There were no vitals filed for this visit.   Patient did not have PT order for evaluation and the hospital note did not recommend a PT consult.             Objective measurements completed on examination: See above findings.                             Patient will benefit from skilled therapeutic intervention in order to improve the following deficits and impairments:     Visit Diagnosis: No diagnosis found.     Problem List Patient Active Problem List   Diagnosis Date Noted  . Atypical chest pain   . Stroke (cerebrum) ([place]) 07/09/2017  . HLD (hyperlipidemia) 11/17/2015  . H/O malignant neoplasm of prostate 11/15/2015  . BP (high blood pressure) 05/01/2014    [place], [place], [place] DPT 07/17/2017, 1:38 PM  [place] MAIN [facility] SERVICES [address] [place], [place], [address] Phone: [phone]   Fax:  [phone]  Name: [place] Gandolfo MRN: [id number] Date of Birth: 1934/10/14

## 2017-07-18 ENCOUNTER — Other Ambulatory Visit: Payer: Self-pay

## 2017-07-18 ENCOUNTER — Encounter: Payer: Self-pay | Admitting: Speech Pathology

## 2017-07-18 NOTE — Therapy (Signed)
[place] MAIN [facility] SERVICES [address] [place], [place], [address] Phone: [phone]   Fax:  [phone]  Speech Language Pathology Evaluation  Patient Details  Name: Jeffrey Rojas MRN: [id number] Date of Birth: 10-Apr-1935 Referring Provider: Garvin Fila    Encounter Date: 07/17/2017  End of Session - 07/18/17 0843    Visit Number  1    Number of Visits  17    Date for SLP Re-Evaluation  09/14/17    SLP Start Time  1400    SLP Stop Time   1445    SLP Time Calculation (min)  45 min    Activity Tolerance  Patient tolerated treatment well       Past Medical History:  Diagnosis Date  . Arthritis   . Cancer ([place])   . History of prostate cancer   . Stroke [facility]) 07/2017    Past Surgical History:  Procedure Laterality Date  . LOOP RECORDER INSERTION N/A 07/11/2017   Procedure: LOOP RECORDER INSERTION;  Surgeon: Constance Haw, MD;  Location: [place] CV LAB;  Service: Cardiovascular;  Laterality: N/A;  . NO PAST SURGERIES    . TEE WITHOUT CARDIOVERSION N/A 07/11/2017   Procedure: TRANSESOPHAGEAL ECHOCARDIOGRAM (TEE);  Surgeon: Acie Fredrickson Wonda Cheng, MD;  Location: [facility] ENDOSCOPY;  Service: Cardiovascular;  Laterality: N/A;    There were no vitals filed for this visit.      SLP Evaluation [facility] - 07/18/17 0001      SLP Visit Information   SLP Received On  07/17/17    Referring Provider  SETHI, PRAMOD S     Onset Date  07/09/2017    Medical Diagnosis  Left PCA CVA      Subjective   Subjective  The patient does not initially identify communication impairment, but agrees that word finding and high level comprehension is problematic after testing.    Patient/Family Stated Goal  Maximize functional oral and written communication skills       Pain Assessment   Currently in Pain?  No/denies      General Information   HPI  83 year old man with S/P left PCA CVA 07/09/2017.      Prior Functional Status   Cognitive/Linguistic  Baseline  Within functional limits      Auditory Comprehension   Overall Auditory Comprehension  Impaired    Overall Auditory Comprehension Comments  Difficulty with lengthy or abstract verbal information      Reading Comprehension   Reading Status  Impaired      Expression   Primary Mode of Expression  Verbal      Verbal Expression   Overall Verbal Expression  Impaired      Oral Motor/Sensory Function   Overall Oral Motor/Sensory Function  Appears within functional limits for tasks assessed      Motor Speech   Overall Motor Speech  Appears within functional limits for tasks assessed      Standardized Assessments   Standardized Assessments   Western Aphasia Battery revised         Western Aphasia Battery- Revised  Spontaneous Speech      Information content  8/10       Fluency   9/10     Comprehension     Yes/No questions  54/60        Auditory Word Recognition 52/60        Sequential Commands 54/80    Repetition   86/100     Naming  Object Naming  38/60        Word Fluency   4/20        Sentence Completion 10/10        Responsive Speech   10/10     Aphasia Quotient  71.6/100  SLP Education - 07/18/17 0843    Education provided  Yes    Education Details  Plan of care    Person(s) Educated  Patient;Child(ren)    Methods  Explanation    Comprehension  Verbalized understanding         SLP Long Term Goals - 07/18/17 0846      SLP LONG TERM GOAL #1   Title  Patient will complete high level word finding tasks with 80% accuracy.    Time  8    Period  Weeks    Status  New    Target Date  09/14/17      SLP LONG TERM GOAL #2   Title  Patient will generate grammatical, fluent, and cogent sentence to complete abstract/complex linguistic task with 80% accuracy.    Time  8    Period  Weeks    Status  New    Target Date  09/14/17      SLP LONG TERM GOAL #3   Title  Patient will complete multi-unit processing tasks with 80% accuracy without the need of  repetition of task instructions or significant delays in responding.    Time  8    Period  Weeks    Status  New    Target Date  09/14/17      SLP LONG TERM GOAL #4   Title  Patient complete functional reading tasks with 80% accuracy.    Time  8    Period  Weeks    Status  New    Target Date  09/14/17       Plan - 07/18/17 0845    Clinical Impression Statement  At one week post onset of left PCA CVA, the patient is presenting with mild-moderate aphasia characterized by reduced information content of spontaneous speech, word finding deficits, paraphasias, and reduced comprehension for complex verbal information.  The patient is having visual changes as well, which will have an impact on written language skills.  The patient will benefit from skilled speech therapy for restorative and compensatory treatment of aphasia as well as ongoing assessment of reading/writing skills.    Speech Therapy Frequency  2x / week    Duration  Other (comment) 8 weeks    Treatment/Interventions  Language facilitation;Patient/family education    Potential to Achieve Goals  Good    Potential Considerations  Ability to learn/carryover information;Severity of impairments;Co-morbidities;Cooperation/participation level;Family/community support;Medical prognosis;Previous level of function    SLP Home Exercise Plan  To be determined    Consulted and Agree with Plan of Care  Patient;Family member/caregiver    Family Member Consulted  Daughter       Patient will benefit from skilled therapeutic intervention in order to improve the following deficits and impairments:   Aphasia - Plan: SLP plan of care cert/re-cert    Problem List Patient Active Problem List   Diagnosis Date Noted  . Atypical chest pain   . Stroke (cerebrum) ([place]) 07/09/2017  . HLD (hyperlipidemia) 11/17/2015  . H/O malignant neoplasm of prostate 11/15/2015  . BP (high blood pressure) 05/01/2014   Leroy Sea, MS/CCC- SLP  Lou Miner 07/18/2017, 8:51 AM  [place] MAIN [facility] SERVICES [phone]  [place], [place], [address] Phone: [phone]   Fax:  [phone]  Name: Jeffrey Rojas MRN: [id number] Date of Birth: 1935-03-27

## 2017-07-20 ENCOUNTER — Encounter: Payer: Medicare Other | Admitting: Occupational Therapy

## 2017-07-20 ENCOUNTER — Encounter: Payer: Medicare Other | Admitting: Speech Pathology

## 2017-07-20 ENCOUNTER — Ambulatory Visit: Payer: Medicare Other | Admitting: Physical Therapy

## 2017-07-24 ENCOUNTER — Ambulatory Visit: Payer: Medicare Other | Admitting: Speech Pathology

## 2017-07-24 ENCOUNTER — Ambulatory Visit: Payer: Medicare Other | Admitting: Occupational Therapy

## 2017-07-25 ENCOUNTER — Ambulatory Visit (INDEPENDENT_AMBULATORY_CARE_PROVIDER_SITE_OTHER): Payer: Self-pay | Admitting: *Deleted

## 2017-07-25 DIAGNOSIS — I639 Cerebral infarction, unspecified: Secondary | ICD-10-CM

## 2017-07-25 LAB — CUP PACEART INCLINIC DEVICE CHECK
MDC IDC PG IMPLANT DT: 20190110
MDC IDC SESS DTM: 20190123131647

## 2017-07-25 NOTE — Progress Notes (Signed)
Wound check appointment. Steri-strips removed. Wound without redness or edema. Incision edges approximated, wound well healed. Normal device function. Battery status: good. R-waves 0.54mV. No symptom or tachy episodes. Pause and brady detection off since implant. 1 AF episode--reviewed with WC, ECG appears ST w/PACs and PVCs, not true AF. Monthly summary reports and ROV with SK/B on 08/14/16 to establish.

## 2017-07-26 ENCOUNTER — Ambulatory Visit: Payer: Medicare Other | Admitting: Physical Therapy

## 2017-07-31 ENCOUNTER — Ambulatory Visit: Payer: Medicare Other

## 2017-07-31 ENCOUNTER — Ambulatory Visit: Payer: Medicare Other | Admitting: Speech Pathology

## 2017-07-31 ENCOUNTER — Ambulatory Visit: Payer: Medicare Other | Admitting: Occupational Therapy

## 2017-08-02 ENCOUNTER — Encounter: Payer: Medicare Other | Admitting: Speech Pathology

## 2017-08-06 ENCOUNTER — Ambulatory Visit: Payer: Medicare Other

## 2017-08-06 ENCOUNTER — Encounter: Payer: Medicare Other | Admitting: Occupational Therapy

## 2017-08-08 ENCOUNTER — Ambulatory Visit: Payer: Medicare Other | Admitting: Physical Therapy

## 2017-08-08 ENCOUNTER — Telehealth: Payer: Self-pay | Admitting: Cardiology

## 2017-08-08 ENCOUNTER — Other Ambulatory Visit: Payer: Self-pay | Admitting: Cardiology

## 2017-08-08 ENCOUNTER — Encounter: Payer: Medicare Other | Admitting: Occupational Therapy

## 2017-08-08 NOTE — Telephone Encounter (Signed)
Attempted to call pt b/c his home monitor has not updated in at least 14 days. No answer and unable to leave a message.  

## 2017-08-10 ENCOUNTER — Ambulatory Visit (INDEPENDENT_AMBULATORY_CARE_PROVIDER_SITE_OTHER): Payer: Medicare Other | Admitting: *Deleted

## 2017-08-10 DIAGNOSIS — I639 Cerebral infarction, unspecified: Secondary | ICD-10-CM | POA: Diagnosis not present

## 2017-08-13 NOTE — Progress Notes (Signed)
Carelink Summary Report / Loop Recorder 

## 2017-08-14 ENCOUNTER — Ambulatory Visit (INDEPENDENT_AMBULATORY_CARE_PROVIDER_SITE_OTHER): Payer: Medicare Other | Admitting: Internal Medicine

## 2017-08-14 ENCOUNTER — Encounter: Payer: Self-pay | Admitting: Internal Medicine

## 2017-08-14 VITALS — BP 128/66 | HR 56 | Ht 69.0 in | Wt 159.8 lb

## 2017-08-14 DIAGNOSIS — Z959 Presence of cardiac and vascular implant and graft, unspecified: Secondary | ICD-10-CM

## 2017-08-14 DIAGNOSIS — I639 Cerebral infarction, unspecified: Secondary | ICD-10-CM

## 2017-08-14 NOTE — Progress Notes (Signed)
Patient Care Team: Baxter Hire, MD as PCP - General (Internal Medicine) Skeet Latch, MD as PCP - Cardiology (Cardiology)   HPI  Jeffrey Rojas is a 83 y.o. male Seen in followup for LINQ implantation 1/19 for Cryptogenic Stroke   DATE TEST EF   1/19 Echo   60-65 %   1/19 TEE 65% AI mod        He has no shortness of breath chest pain edema or palpitations.  Continues with some residual visual changes from his TIA.  Records and Results Reviewed    Past Medical History:  Diagnosis Date  . Arthritis   . Cancer ([place])   . History of prostate cancer   . Stroke [facility]) 07/2017    Past Surgical History:  Procedure Laterality Date  . LOOP RECORDER INSERTION N/A 07/11/2017   Procedure: LOOP RECORDER INSERTION;  Surgeon: Constance Haw, MD;  Location: [place] CV LAB;  Service: Cardiovascular;  Laterality: N/A;  . NO PAST SURGERIES    . TEE WITHOUT CARDIOVERSION N/A 07/11/2017   Procedure: TRANSESOPHAGEAL ECHOCARDIOGRAM (TEE);  Surgeon: Acie Fredrickson Wonda Cheng, MD;  Location: [facility] ENDOSCOPY;  Service: Cardiovascular;  Laterality: N/A;    Current Outpatient Medications  Medication Sig Dispense Refill  . amLODipine-benazepril (LOTREL) 10-20 MG capsule Take 1 capsule by mouth daily.     . clopidogrel (PLAVIX) 75 MG tablet Take 1 tablet (75 mg total) by mouth daily. 30 tablet 1  . nitroGLYCERIN (NITROSTAT) 0.4 MG SL tablet Place 1 tablet (0.4 mg total) under the tongue every 5 (five) minutes as needed for chest pain (notify PCP if experiencing chest pain and need to take Nitroglycerin). 30 tablet 1  . simvastatin (ZOCOR) 40 MG tablet Take 1 tablet (40 mg total) by mouth daily at 6 PM. 30 tablet 1   No current facility-administered medications for this visit.     No Known Allergies    Review of Systems negative except from HPI and PMH  Physical Exam BP 128/66 (BP Location: Left Arm, Patient Position: Sitting, Cuff Size: Normal)   Pulse (!) 56   Ht 5\' 9"  (1.753 m)    Wt 159 lb 12 oz (72.5 kg)   BMI 23.59 kg/m  Well developed and well nourished in no acute distress HENT normal E scleral and icterus clear Neck Supple JVP flat; carotids brisk and full Clear to ausculation regular rate and rhythm, 2/6 systolic murmur and a 2/6 diastolic murmur Soft with active bowel sounds No clubbing cyanosis  Edema Alert and oriented, grossly normal motor and sensory function Skin Warm and Dry  ECG demonstrates sinus rhythm at 56 Intervals 19/08/42 Inferior Q waves  LINQ  Personally reviewed sinus rhythm with frequent PACs and nonsustained atrial tachycardia  Assessment and  Plan  Cryptogenic stroke  AS/ AI   Moderate  Hyperlipidemia  Hypertension  No bleeding on Plavix  Blood pressures well controlled  I wonder, whether based on the SPARCL trial whether he should be on atorvastatin.  I have asked him to review this with his PCP  His device falsely detected atrial fibrillation because of frequent ventricular ectopy.  We will follow him remotely.  We reviewed the physiology of atrial fibrillation and its association with potential for blood clots  We spent more than 50% of our >25 min visit in face to face counseling regarding the above   Current medicines are reviewed at length with the patient today .  The patient does not  have  concerns regarding medicines.

## 2017-08-14 NOTE — Patient Instructions (Signed)
Medication Instructions: - Your physician recommends that you continue on your current medications as directed. Please refer to the Current Medication list given to you today.  Labwork: - none ordered  Procedures/Testing: - none ordered  Follow-Up: - Dr. Klein will see you back on an as needed basis.  Any Additional Special Instructions Will Be Listed Below (If Applicable).     If you need a refill on your cardiac medications before your next appointment, please call your pharmacy.   

## 2017-08-15 ENCOUNTER — Ambulatory Visit: Payer: Medicare Other | Admitting: Occupational Therapy

## 2017-08-15 ENCOUNTER — Ambulatory Visit: Payer: Medicare Other

## 2017-08-16 ENCOUNTER — Encounter: Payer: Self-pay | Admitting: Cardiology

## 2017-08-17 ENCOUNTER — Ambulatory Visit: Payer: Medicare Other

## 2017-08-17 ENCOUNTER — Encounter: Payer: Medicare Other | Admitting: Occupational Therapy

## 2017-08-17 LAB — CUP PACEART INCLINIC DEVICE CHECK
Implantable Pulse Generator Implant Date: 20190110
MDC IDC SESS DTM: 20190215170541

## 2017-08-21 ENCOUNTER — Ambulatory Visit: Payer: Medicare Other | Admitting: Occupational Therapy

## 2017-08-21 ENCOUNTER — Ambulatory Visit: Payer: Medicare Other

## 2017-08-23 ENCOUNTER — Encounter: Payer: Medicare Other | Admitting: Occupational Therapy

## 2017-08-24 ENCOUNTER — Encounter: Payer: Medicare Other | Admitting: Occupational Therapy

## 2017-08-24 ENCOUNTER — Ambulatory Visit: Payer: Medicare Other

## 2017-08-28 ENCOUNTER — Encounter: Payer: Medicare Other | Admitting: Occupational Therapy

## 2017-08-28 ENCOUNTER — Ambulatory Visit: Payer: Medicare Other

## 2017-08-30 ENCOUNTER — Ambulatory Visit: Payer: Medicare Other

## 2017-08-30 ENCOUNTER — Encounter: Payer: Self-pay | Admitting: Cardiology

## 2017-08-30 ENCOUNTER — Encounter: Payer: Medicare Other | Admitting: Occupational Therapy

## 2017-09-04 LAB — CUP PACEART REMOTE DEVICE CHECK
MDC IDC PG IMPLANT DT: 20190110
MDC IDC SESS DTM: 20190208223950

## 2017-09-06 ENCOUNTER — Telehealth: Payer: Self-pay | Admitting: Internal Medicine

## 2017-09-06 NOTE — Telephone Encounter (Signed)
New message   1. Has your device fired? no  2. Is you device beeping? no  3. Are you experiencing draining or swelling at device site? no  4. Are you calling to see if we received your device transmission? yes  5. Have you passed out? no  Pt states that the monitor keeps going off and restarting again  Please route to [place]

## 2017-09-06 NOTE — Telephone Encounter (Signed)
3 full reports sent so far this afternoon. I have advised Jeffrey Rojas and family member on the phone to leave the monitor alone once the check mark is on the screen. The monitor will fall back "asleep". They verbalize understanding and are appreciative.

## 2017-09-12 ENCOUNTER — Ambulatory Visit (INDEPENDENT_AMBULATORY_CARE_PROVIDER_SITE_OTHER): Payer: Medicare Other | Admitting: *Deleted

## 2017-09-12 ENCOUNTER — Encounter: Payer: Self-pay | Admitting: Adult Health

## 2017-09-12 ENCOUNTER — Ambulatory Visit (INDEPENDENT_AMBULATORY_CARE_PROVIDER_SITE_OTHER): Payer: Medicare Other | Admitting: Adult Health

## 2017-09-12 ENCOUNTER — Telehealth: Payer: Self-pay | Admitting: Adult Health

## 2017-09-12 ENCOUNTER — Other Ambulatory Visit: Payer: Self-pay

## 2017-09-12 VITALS — BP 150/77 | HR 62 | Ht 69.0 in | Wt 157.8 lb

## 2017-09-12 DIAGNOSIS — I639 Cerebral infarction, unspecified: Secondary | ICD-10-CM

## 2017-09-12 DIAGNOSIS — G253 Myoclonus: Secondary | ICD-10-CM | POA: Diagnosis not present

## 2017-09-12 DIAGNOSIS — E785 Hyperlipidemia, unspecified: Secondary | ICD-10-CM | POA: Diagnosis not present

## 2017-09-12 DIAGNOSIS — H5347 Heteronymous bilateral field defects: Secondary | ICD-10-CM

## 2017-09-12 DIAGNOSIS — I10 Essential (primary) hypertension: Secondary | ICD-10-CM | POA: Diagnosis not present

## 2017-09-12 MED ORDER — ATORVASTATIN CALCIUM 20 MG PO TABS: 20.0000 mg | ORAL_TABLET | Freq: Every day | ORAL | 0 refills | Status: AC

## 2017-09-12 MED ORDER — CLOPIDOGREL BISULFATE 75 MG PO TABS: 75.0000 mg | ORAL_TABLET | Freq: Every day | ORAL | 0 refills | Status: DC

## 2017-09-12 NOTE — Patient Instructions (Addendum)
Continue clopidogrel 75 mg daily  and Lipitor  for secondary stroke prevention Follow up with PCP regarding your blood pressure, cholesterol and depression If you are feeling suicidal at this time, I would highly recommend you go to [facility] ED or you can go to [facility] on [address] [place] [phone] for assistance. I also recommend you follow closely with your primary care regarding your depression. You can also reach out to suicide hotline at [phone] If you ever feel as though you are suicidal with plan or intent on harming or killing yourself, call 911 immediately  Please call to let us or your primary care to provide new mail order pharmacy information (office [phone])  Maintain strict control of hypertension with blood pressure goal below 130/90, diabetes with hemoglobin A1c goal below 6.5% and cholesterol with LDL cholesterol (bad cholesterol) goal below 70 mg/dL. I also advised the patient to eat a healthy diet with plenty of whole grains, cereals, fruits and vegetables, exercise regularly and maintain ideal body weight.  Followup in the future with me in 4 months or call earlier with further questions or concerns

## 2017-09-12 NOTE — Progress Notes (Signed)
[facility] [address] [place]. [place] [address] [phone]       OFFICE FOLLOW UP NOTE  [name] Date of Birth:  [birth year] Medical Record Number:  [id number]   Reason for Referral:  Hospital stroke follow up  CHIEF COMPLAINT:  Chief Complaint  Patient presents with  . Follow-up    Patient doing well since stroke.     HPI: Jeffrey Rojas is being seen today for initial visit in the office for left posterior cerebral artery embolic infarct on 08/05/74. History obtained from patient and chart review. Reviewed all radiology images and labs personally.  [name] is an 83 year old male with PMH of prostate cancer, HLD and HTN who was brought in to [facility] for not feeling right and being confused. He was noted at that time to have right sided weakness, aphasia and right sided neglect with an NIHSS of 9 by telemedicine neurology. CT head negative for acute hemorrhage. TPA was administered, for these symptoms were suspected to be of acute ischemic stroke. He was transferred to [facility] for higher level of care. CTA head and neck and CT perfusion study showed left P1 occlusion. The perfusion study showed an area of core around 25cc area at risk of 42cc. This case was discussed with neuro endovascular specialist Dr. Estanislado Pandy and due to his low stroke scale and rapidly improving symptoms, he was deemed not a candidate for IA therapy. MRI brain reviewed and showed moderate to large evolving acute ischemic left PCA territory infarct without significant mass effect. 2D echo showed EF of 60-65% without PFO. TEE showed no evidence of cardiac emboli. Aspirin 81mg  PTA - recommended patient stop asa and start plavix 75mg  daily. Zocor 20mg  PTA - LDL 78 - recommended patient increase to zocor 40mg . Loop recorder was placed on 07/11/17 due to cryptogenic etiology. Discharged home with home PT/OT/SLP recommendations.   Patient is being seen today for initial hospital  follow-up visit.  Patient is accompanied by friend.  He does state he has had since discharge, patient states he has gained approximately 90-95% of his vision back.  He does need cataract surgery.  He was released to drive from his ophthalmologist.  There has been an increase in short-term memory loss but this has improved slightly since his stroke.  Continues to take Plavix without increase in bleeding or bruising.  Continues to take a tour without myalgias.  Blood pressure mildly elevated at today's visit at 150/77.  Does have a loop recorder without evidence of atrial fibrillation at this time.  Denies symptoms of sleep apnea such as snoring, apneic episodes, daytime sleepiness or frequent napping.  Patient declined interest in PRIEMERS trial. Reports recent issues with depression and suicidal thoughts.  Denies history with any type of mental illness prior to the stroke.  States he feels depressed based on vision and memory loss since his stroke.  Questioned patient regarding suicidal ideation but states he is "just tired".  Asked patient regarding actual plan but he just shrugged his shoulders.  Patient does have access to guns in his home.  When questioned regarding use of guns or using medications to harm or kill himself, he states "if I am feeling it at that time, it will just happen. It is nothing I can plan for".  No actual plan was stated.  Denies actual self-harm.  Celexa 20 mg prescribed 2 weeks ago by PCP.  Patient and friend both believe this has had a small  effect on depression within the past 2 weeks.  It was emphasized that this medication could take 4-6 weeks to feel full effect.  Follow-up appointment with PCP currently scheduled for 10/11/2017.  It was strongly recommended that if patient currently feels suicidal, that he be brought to [facility] ED but he denies suicidal thoughts at this time.  It was also recommended that patient consider going to [facility] which was explained to  him as a walk-in clinic and address and phone number were provided.  Patient does not want to go at this time but appreciates information.  Denying psych referral at this time as well.  Friend states she currently sees a Social worker in [place] and it was agreed upon that they will stop there on their way home from this appointment.  It was advised to patient that if he does have any suicidal ideations or self injury, he called 911 immediately.  It was also encouraged patient continue to follow-up with PCP regarding his depression and suicidal ideation.  Denies new or worsening stroke/TIA symptoms.    ROS:   14 system review of systems performed and negative with exception of appetite change, unexpected weight change, loss of vision, memory loss, agitation, behavior problem, confusion, depression, suicidal thoughts, and self injury  PMH:  Past Medical History:  Diagnosis Date  . Arthritis   . Cancer ([employer])   . History of prostate cancer   . Stroke [facility]) 07/2017    PSH:  Past Surgical History:  Procedure Laterality Date  . LOOP RECORDER INSERTION N/A 07/11/2017   Procedure: LOOP RECORDER INSERTION;  Surgeon: Constance Haw, MD;  Location: [place] [facility];  Service: Cardiovascular;  Laterality: N/A;  . NO PAST SURGERIES    . TEE WITHOUT CARDIOVERSION N/A 07/11/2017   Procedure: TRANSESOPHAGEAL ECHOCARDIOGRAM (TEE);  Surgeon: Acie Fredrickson Wonda Cheng, MD;  Location: [facility] ENDOSCOPY;  Service: Cardiovascular;  Laterality: N/A;    Social History:  Social History   Socioeconomic History  . Marital status: Widowed    Spouse name: Not on file  . Number of children: Not on file  . Years of education: Not on file  . Highest education level: Not on file  Social Needs  . Financial resource strain: Not on file  . Food insecurity - worry: Not on file  . Food insecurity - inability: Not on file  . Transportation needs - medical: Not on file  . Transportation needs - non-medical: Not on file    Occupational History  . Not on file  Tobacco Use  . Smoking status: Never Smoker  . Smokeless tobacco: Never Used  Substance and Sexual Activity  . Alcohol use: Yes    Alcohol/week: 0.0 oz    Comment: ocassionally  . Drug use: No  . Sexual activity: Not on file  Other Topics Concern  . Not on file  Social History Narrative  . Not on file    Family History:  Family History  Problem Relation Age of Onset  . Prostate cancer Neg Hx   . Kidney cancer Neg Hx     Medications:   Current Outpatient Medications on File Prior to Visit  Medication Sig Dispense Refill  . amLODipine-benazepril (LOTREL) 10-20 MG capsule Take 1 capsule by mouth daily.     Marland Kitchen atorvastatin (LIPITOR) 20 MG tablet Take by mouth.    . citalopram (CELEXA) 20 MG tablet Take by mouth.    . clopidogrel (PLAVIX) 75 MG tablet Take 1 tablet (75 mg  total) by mouth daily. 30 tablet 1  . metFORMIN (GLUCOPHAGE) 500 MG tablet Take by mouth.    . nitroGLYCERIN (NITROSTAT) 0.4 MG SL tablet Place 1 tablet (0.4 mg total) under the tongue every 5 (five) minutes as needed for chest pain (notify PCP if experiencing chest pain and need to take Nitroglycerin). 30 tablet 1   No current facility-administered medications on file prior to visit.     Allergies:  No Known Allergies   Physical Exam  Vitals:   09/12/17 1020  BP: (!) 150/77  Pulse: 62  Weight: 157 lb 12.8 oz (71.6 kg)  Height: 5\' 9"  (1.753 m)   Body mass index is 23.3 kg/m. No exam data present  General: well developed, elderly African-American male, well nourished, seated, in no evident distress Head: head normocephalic and atraumatic.   Neck: supple with no carotid or supraclavicular bruits Cardiovascular: regular rate and rhythm, no murmurs Musculoskeletal: no deformity Skin:  no rash/petichiae Vascular:  Normal pulses all extremities  Neurologic Exam Mental Status: Awake and fully alert. Oriented to place and time.  Recall 0/3.  AFT 6.  Clock drawing  4/4.  Remote memory intact. Attention span, concentration and fund of knowledge appropriate. Mood and affect appropriate.  Cranial Nerves: Fundoscopic exam reveals sharp disc margins. Pupils equal, briskly reactive to light. Extraocular movements full without nystagmus.  Contralateral homonymous superior quadrantanopia present. hearing intact. Facial sensation intact. Face, tongue, palate moves normally and symmetrically.  Motor: Normal bulk and tone. Normal strength in all tested extremity muscles. Sensory.: intact to touch , pinprick , position and vibratory sensation.  Coordination: Rapid alternating movements normal in all extremities. Finger-to-nose and heel-to-shin performed accurately bilaterally. Gait and Station: Arises from chair without difficulty. Stance is normal. Gait demonstrates normal stride length and balance . Able to heel, toe and tandem walk without difficulty.  Reflexes: Brisk  and symmetric. Toes downgoing.    NIHSS  1 Modified Rankin  2    Diagnostic Data (Labs, Imaging, Testing)  Ct Angio HeadNeckW Or Wo Contrastwith Perfusion Result Date: 07/09/2017 IMPRESSION: 1. Occlusion of the left posterior cerebral artery at the junction of the P1 and P2 segments with associated left PCA distribution infarct. 2. Left PCA infarct volume measuring 25 mL with 17 mL of penumbra. 3. No other intracranial occlusion or high-grade stenosis. 4. Aortic Atherosclerosis (ICD10-I70.0). No hemodynamically significant stenosis of the carotid or vertebral arteries.   Dg Chest 2 View Result Date: 07/10/2017 IMPRESSION: No active cardiopulmonary disease.Tortuous atherosclerotic aorta without aneurysm.   Ct Head Code Stroke Wo Contrast Result Date: 07/09/2017 IMPRESSION: 1. No acute hemorrhage or mass lesion. 2. ASPECTS is 10.  Echocardiogram Result date: 07/10/17 Study Conclusions - Left ventricle: The cavity size was normal. Wall thickness  was normal. Systolic function was normal. The estimated ejection fraction was in the range of 60% to 65%. - Aortic valve: There was very mild stenosis. There was mild regurgitation. - Mitral valve: Valve area by pressure half-time: 2.12 cm^2. - Atrial septum: No defect or patent foramen ovalewas identified.  MRI BRAIN  Result date: 07/10/17 IMPRESSION: 1. Moderate to large evolving acute ischemic left PCA territory infarct without significant mass effect. Associated mild petechial hemorrhage without frank hemorrhagic transformation. 2. Mild chronic microvascular ischemic disease for age.  TEE     Result date: 07/11/2017 Left Ventrical:Normal LV function Mitral Valve:mild MR Aortic Valve:moderate AI ( central jet ) Tricuspid Valve:trace - mild TR Pulmonic Valve:trivial PI Left Atrium/ Left atrial appendage:no thrombi Atrial septum:aneurismal  but no evidence of ASD or PFO by color or bubble study Aorta:mild aortic atheroma     ASSESSMENT: Jeffrey Rojas is a 83 y.o. 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 Vascular risk factors include HTN and HLD. Loop recorder placed on 07/11/17. New c/o depression with suicidal ideation.   PLAN: -Continue clopidogrel 75 mg daily  and lipitor for secondary stroke prevention -it was recommended that patient go to [facility] ED if he was feeling suicidal. Also provided information for [facility] for assistance. Suicide hotline number provided. Patient declined psych referral at this time. He will call if he would like one placed. It was also recommended that patient follow up closely with PCP regarding his depression. Advised patient to call 911 immediately with suicidal thoughts or ideations  -3 month supply of lipitor 20mg  and plavix 75mg  through mail order until they see their primary care physician who can  continue ordering these medications (patient will call back with information on new pharmacy) -Maintain strict control of hypertension with blood pressure goal below 130/90, diabetes with hemoglobin A1c goal below 6.5% and cholesterol with LDL cholesterol (bad cholesterol) goal below 70 mg/dL. I also advised the patient to eat a healthy diet with plenty of whole grains, cereals, fruits and vegetables, exercise regularly and maintain ideal body weight. -Greater than 50% time during this 45 minute consultation visit was spent on counseling and coordination of care about HTN and HLD, discussion about risk benefit of anticoagulation and answering questions. Extensive discussion regarding depression and suicidal ideation.  Follow up 4 months or call earlier if needed   Venancio Poisson, [facility]  [facility] [address] [place], [place] [address]  Phone [phone] Fax [phone]

## 2017-09-12 NOTE — Telephone Encounter (Signed)
RN spoke with Janett Billow NP. Janett Billow Np stated daughter is aware 90 day rx with no refills on plavix,a nd [facility]. Pt will be seeing PCP in May 2019 to start doing refills ongoing. Rx sent for 3 months only no refills of lipitor,and plavix.

## 2017-09-12 NOTE — Telephone Encounter (Signed)
Levada Dy patient's daughter calling to advise all Rx's should be sent to [facility] mail order.

## 2017-09-13 ENCOUNTER — Other Ambulatory Visit: Payer: Self-pay | Admitting: Internal Medicine

## 2017-09-13 NOTE — Progress Notes (Signed)
Carelink Summary Report / Loop Recorder 

## 2017-09-17 NOTE — Progress Notes (Signed)
I agree with the above plan 

## 2017-09-19 ENCOUNTER — Other Ambulatory Visit: Payer: Self-pay | Admitting: Internal Medicine

## 2017-10-15 ENCOUNTER — Other Ambulatory Visit: Payer: Self-pay

## 2017-10-15 ENCOUNTER — Ambulatory Visit (INDEPENDENT_AMBULATORY_CARE_PROVIDER_SITE_OTHER): Payer: Medicare Other | Admitting: *Deleted

## 2017-10-15 DIAGNOSIS — I639 Cerebral infarction, unspecified: Secondary | ICD-10-CM | POA: Diagnosis not present

## 2017-10-15 NOTE — Telephone Encounter (Signed)
This encounter was created in error - please disregard.

## 2017-10-15 NOTE — Patient Outreach (Signed)
Telephone outreach to patient to obtain mRS was successfully completed. mRS = 0 

## 2017-10-16 NOTE — Progress Notes (Signed)
Carelink Summary Report / Loop Recorder 

## 2017-10-22 LAB — CUP PACEART REMOTE DEVICE CHECK
Implantable Pulse Generator Implant Date: 20190110
MDC IDC SESS DTM: 20190314000943

## 2017-11-14 LAB — CUP PACEART REMOTE DEVICE CHECK
Implantable Pulse Generator Implant Date: 20190110
MDC IDC SESS DTM: 20190416003548

## 2017-11-19 ENCOUNTER — Ambulatory Visit (INDEPENDENT_AMBULATORY_CARE_PROVIDER_SITE_OTHER): Payer: Medicare Other | Admitting: *Deleted

## 2017-11-19 DIAGNOSIS — I639 Cerebral infarction, unspecified: Secondary | ICD-10-CM | POA: Diagnosis not present

## 2017-11-19 NOTE — Progress Notes (Signed)
Carelink Summary Report / Loop Recorder 

## 2017-12-04 ENCOUNTER — Telehealth: Payer: Self-pay | Admitting: *Deleted

## 2017-12-04 DIAGNOSIS — I480 Paroxysmal atrial fibrillation: Secondary | ICD-10-CM

## 2017-12-04 NOTE — Telephone Encounter (Signed)
LVMOM regarding sending manual transmission to review 2 AF episodes. [facility] phone number to call back with questions or concerns.

## 2017-12-05 NOTE — Telephone Encounter (Signed)
Patient calling back. I informed him that Device Tech RN needed him to send a manual transmission. Instructed pt how to do this. Informed him that Device Tech RN will review and if she needs any further information she will call him back. Pt verbalized understanding.

## 2017-12-06 ENCOUNTER — Other Ambulatory Visit: Payer: Self-pay | Admitting: Adult Health

## 2017-12-07 DIAGNOSIS — I480 Paroxysmal atrial fibrillation: Secondary | ICD-10-CM | POA: Insufficient documentation

## 2017-12-07 NOTE — Telephone Encounter (Signed)
Dr. Caryl Comes reviewed "AF" episodes detected by device, agrees that ECGs indicate AF.  Per Dr. Caryl Comes, schedule appointment next week to discuss [facility].  Patient prefers [place] f/u, scheduled on 12/11/17 at 8:45am with Dr. Caryl Comes in [place].  Patient is aware of office address and phone number.  He denies any questions or concerns at this time.

## 2017-12-11 ENCOUNTER — Telehealth: Payer: Self-pay | Admitting: Internal Medicine

## 2017-12-11 ENCOUNTER — Ambulatory Visit (INDEPENDENT_AMBULATORY_CARE_PROVIDER_SITE_OTHER): Payer: Medicare Other | Admitting: Internal Medicine

## 2017-12-11 ENCOUNTER — Encounter: Payer: Self-pay | Admitting: Internal Medicine

## 2017-12-11 VITALS — BP 122/60 | Ht 69.0 in | Wt 155.5 lb

## 2017-12-11 DIAGNOSIS — Z79899 Other long term (current) drug therapy: Secondary | ICD-10-CM | POA: Diagnosis not present

## 2017-12-11 DIAGNOSIS — I480 Paroxysmal atrial fibrillation: Secondary | ICD-10-CM

## 2017-12-11 DIAGNOSIS — I350 Nonrheumatic aortic (valve) stenosis: Secondary | ICD-10-CM | POA: Diagnosis not present

## 2017-12-11 DIAGNOSIS — I639 Cerebral infarction, unspecified: Secondary | ICD-10-CM | POA: Diagnosis not present

## 2017-12-11 MED ORDER — APIXABAN 5 MG PO TABS: 5.0000 mg | ORAL_TABLET | Freq: Two times a day (BID) | ORAL | 11 refills | Status: DC

## 2017-12-11 NOTE — Addendum Note (Signed)
Addended by: Alvis Lemmings C on: 12/11/2017 10:28 AM   Modules accepted: Orders

## 2017-12-11 NOTE — Addendum Note (Signed)
Addended byAlvis Lemmings C on: 12/11/2017 10:06 AM   Modules accepted: Orders

## 2017-12-11 NOTE — Patient Instructions (Addendum)
Medication Instructions: - Your physician has recommended you make the following change in your medication:  1) START eliquis 5 mg- take 1 tablet by mouth TWICE daily 2) STOP plavix  Labwork: - Your physician recommends that you have lab work today: BMP/ CBC  Procedures/Testing: - none ordered  Follow-Up: - Your physician recommends that you schedule a follow-up appointment in: 3 months with Dr. Caryl Comes.   Any Additional Special Instructions Will Be Listed Below (If Applicable).     If you need a refill on your cardiac medications before your next appointment, please call your pharmacy.

## 2017-12-11 NOTE — Telephone Encounter (Signed)
Please call to discuss Hydrochlorothiazide.

## 2017-12-11 NOTE — Progress Notes (Signed)
Patient Care Team: Baxter Hire, MD as PCP - General (Internal Medicine) Skeet Latch, MD as PCP - Cardiology (Cardiology)   HPI  Jeffrey Rojas is a 83 y.o. male Seen in followup for LINQ implantation 1/19 for Cryptogenic Stroke>>   transmissions demonstrate atrial fibrillation.  He is here to review.  DATE TEST EF   1/19 Echo   60-65 %   1/19 TEE 65% AI mod        Denies chest pain shortness of breath edema or palpitations.  Continues with some residual visual changes from his TIA; he is also struggled with emotional lability.  This is been embarrassing.  Records and Results Reviewed   Date Cr Hgb  1/19 1.13           Past Medical History:  Diagnosis Date  . Arthritis   . Cancer ([place])   . History of prostate cancer   . Stroke [facility]) 07/2017    Past Surgical History:  Procedure Laterality Date  . LOOP RECORDER INSERTION N/A 07/11/2017   Procedure: LOOP RECORDER INSERTION;  Surgeon: Constance Haw, MD;  Location: [place] [facility];  Service: Cardiovascular;  Laterality: N/A;  . NO PAST SURGERIES    . TEE WITHOUT CARDIOVERSION N/A 07/11/2017   Procedure: TRANSESOPHAGEAL ECHOCARDIOGRAM (TEE);  Surgeon: Acie Fredrickson Wonda Cheng, MD;  Location: [facility] ENDOSCOPY;  Service: Cardiovascular;  Laterality: N/A;    Current Outpatient Medications  Medication Sig Dispense Refill  . amLODipine-benazepril (LOTREL) 10-20 MG capsule Take 1 capsule by mouth daily.     Marland Kitchen atorvastatin (LIPITOR) 20 MG tablet Take 1 tablet (20 mg total) by mouth daily at 6 PM. 90 tablet 0  . citalopram (CELEXA) 20 MG tablet Take by mouth.    . clopidogrel (PLAVIX) 75 MG tablet Take 1 tablet (75 mg total) by mouth daily. 90 tablet 0  . docusate sodium (STOOL SOFTENER) 100 MG capsule Take 200 mg by mouth 2 (two) times daily.    . Ferrous Sulfate (IRON) 325 (65 Fe) MG TABS Take 325 mg by mouth daily.     . hydrochlorothiazide (HYDRODIURIL) 25 MG tablet Take 25 mg by mouth daily.    . metFORMIN  (GLUCOPHAGE) 500 MG tablet Take 500 mg by mouth daily with breakfast.     . nitroGLYCERIN (NITROSTAT) 0.4 MG SL tablet Place 1 tablet (0.4 mg total) under the tongue every 5 (five) minutes as needed for chest pain (notify PCP if experiencing chest pain and need to take Nitroglycerin). 30 tablet 1  . vitamin B-12 (CYANOCOBALAMIN) 1000 MCG tablet Take 1,000 mcg by mouth daily.     No current facility-administered medications for this visit.     No Known Allergies    Review of Systems negative except from HPI and PMH  Physical Exam BP 122/60 (BP Location: Left Arm, Patient Position: Sitting, Cuff Size: Normal)   Ht 5\' 9"  (1.753 m)   Wt 155 lb 8 oz (70.5 kg)   BMI 22.96 kg/m  Well developed and nourished in no acute distress HENT normal Neck supple with JVP-flat Carotids brisk and full without bruits Clear Regular rate and YWVPXT,0/6 systolic and diastolic m Abd-soft with active BS without hepatomegaly No Clubbing cyanosis edema Skin-warm and dry A & Oriented  Grossly normal sensory and motor function   ECG demonstrates sinus rhythm  LINQ  Personally reviewed atrial fibrillation/flutter  Assessment and  Plan  Cryptogenic stroke  AS/ AI   Moderate  Hyperlipidemia  Hypertension  Atrial fibrillation/flutter  No bleeding on Plavix.  We have reviewed the presence of atrial fibrillation on his Linq and have discussed extensively the issues of anticoagulation related to atrial fibrillation and the distinction between atrial fibrillation and SCAF, associated stroke risks and the implications regarding the anticipated benefits of anticoagulation versus antiplatelet therapy being uncertain. That having been said, we have elected to discontinue Plavix and begin him on apixaban. We have reviewed bleeding risks.  Continue on statin therapy.  Stressed the importance of getting some help with medication in his pillboxes.  Discussed the stresses of trying to deal with the aftermath  of his stroke  More than 50% of 40 min was spent in counseling related to the above

## 2017-12-12 ENCOUNTER — Telehealth: Payer: Self-pay | Admitting: Internal Medicine

## 2017-12-12 LAB — BASIC METABOLIC PANEL
BUN / CREAT RATIO: 9 — AB (ref 10–24)
BUN: 9 mg/dL (ref 8–27)
CHLORIDE: 101 mmol/L (ref 96–106)
CO2: 25 mmol/L (ref 20–29)
Calcium: 9.9 mg/dL (ref 8.6–10.2)
Creatinine, Ser: 0.97 mg/dL (ref 0.76–1.27)
GFR calc Af Amer: 85 mL/min/{1.73_m2} (ref >59)
GFR calc non Af Amer: 73 mL/min/{1.73_m2} (ref >59)
GLUCOSE: 162 mg/dL — AB (ref 65–99)
Potassium: 4.1 mmol/L (ref 3.5–5.2)
SODIUM: 141 mmol/L (ref 134–144)

## 2017-12-12 LAB — CBC WITH DIFFERENTIAL/PLATELET
BASOS ABS: 0 10*3/uL (ref 0.0–0.2)
Basos: 0 %
EOS (ABSOLUTE): 0 10*3/uL (ref 0.0–0.4)
Eos: 0 %
Hematocrit: 48.1 % (ref 37.5–51.0)
Hemoglobin: 15.3 g/dL (ref 13.0–17.7)
Immature Grans (Abs): 0 10*3/uL (ref 0.0–0.1)
Immature Granulocytes: 0 %
LYMPHS ABS: 1.9 10*3/uL (ref 0.7–3.1)
Lymphs: 30 %
MCH: 28.7 pg (ref 26.6–33.0)
MCHC: 31.8 g/dL (ref 31.5–35.7)
MCV: 90 fL (ref 79–97)
MONOCYTES: 6 %
MONOS ABS: 0.4 10*3/uL (ref 0.1–0.9)
Neutrophils Absolute: 4.2 10*3/uL (ref 1.4–7.0)
Neutrophils: 64 %
PLATELETS: 240 10*3/uL (ref 150–450)
RBC: 5.34 x10E6/uL (ref 4.14–5.80)
RDW: 14.7 % (ref 12.3–15.4)
WBC: 6.5 10*3/uL (ref 3.4–10.8)

## 2017-12-12 NOTE — Telephone Encounter (Signed)
I left a message for Jeffrey Rojas to call.

## 2017-12-12 NOTE — Telephone Encounter (Signed)
Fax received from CoverMyMeds that the patient's eliquis had been rejected by his insurance.  A prior authorization was submitted through CoverMyMeds and was approved from 11/12/17-12/12/18. I left a message at the [facility] on [place], [phone], that this has been approved

## 2017-12-14 NOTE — Telephone Encounter (Signed)
Patient had received a phone call and was upset because he did not know why they called.  Patient's caregiver called and I was able to speak with her.  I was able to read the notes in the computer stating that his Eliquis was approved by cover my meds and that the [facility] on [employer] was given this information.  She was grateful for the information.  Rosaria Ferries, PA-C 12/14/2017 2:23 PM Beeper [phone]

## 2017-12-17 ENCOUNTER — Other Ambulatory Visit: Payer: Self-pay | Admitting: Internal Medicine

## 2017-12-17 LAB — CUP PACEART REMOTE DEVICE CHECK
Date Time Interrogation Session: 20190519003518
Implantable Pulse Generator Implant Date: 20190110

## 2017-12-20 ENCOUNTER — Ambulatory Visit (INDEPENDENT_AMBULATORY_CARE_PROVIDER_SITE_OTHER): Payer: Medicare Other | Admitting: *Deleted

## 2017-12-20 DIAGNOSIS — I639 Cerebral infarction, unspecified: Secondary | ICD-10-CM | POA: Diagnosis not present

## 2017-12-21 NOTE — Progress Notes (Signed)
Carelink Summary Report / Loop Recorder 

## 2017-12-24 LAB — CUP PACEART INCLINIC DEVICE CHECK
Implantable Pulse Generator Implant Date: 20190110
MDC IDC SESS DTM: 20190611114622

## 2018-01-16 ENCOUNTER — Ambulatory Visit: Payer: Medicare Other | Admitting: Adult Health

## 2018-01-17 ENCOUNTER — Other Ambulatory Visit: Payer: Self-pay | Admitting: Neurology

## 2018-01-22 ENCOUNTER — Ambulatory Visit (INDEPENDENT_AMBULATORY_CARE_PROVIDER_SITE_OTHER): Payer: Medicare Other | Admitting: *Deleted

## 2018-01-22 DIAGNOSIS — I639 Cerebral infarction, unspecified: Secondary | ICD-10-CM | POA: Diagnosis not present

## 2018-01-23 NOTE — Progress Notes (Signed)
Carelink Summary Report / Loop Recorder 

## 2018-01-24 LAB — CUP PACEART REMOTE DEVICE CHECK
Implantable Pulse Generator Implant Date: 20190110
MDC IDC SESS DTM: 20190621003526

## 2018-01-25 ENCOUNTER — Telehealth: Payer: Self-pay | Admitting: *Deleted

## 2018-01-25 ENCOUNTER — Telehealth: Payer: Self-pay | Admitting: Internal Medicine

## 2018-01-25 NOTE — Telephone Encounter (Signed)
No note needed 

## 2018-01-25 NOTE — Telephone Encounter (Signed)
Opened in Error.

## 2018-01-25 NOTE — Telephone Encounter (Signed)
Attempted to reach patient regarding sending a manual transmission to review full report-- No answer, Mailbox Full. 3 tachy episodes-- 1 available ECG appears SVT.

## 2018-01-25 NOTE — Telephone Encounter (Addendum)
Spoke with patient regarding sending a manual transmission and symptoms associated with tachy episodes. Attempted to send manual transmission. Patient states he felt "a little something" in his chest. Patient states no chest pain/pressure, no palpitations, no SOB, no syncope or presyncope. Will review episodes once received.

## 2018-01-30 ENCOUNTER — Telehealth: Payer: Self-pay | Admitting: Internal Medicine

## 2018-01-30 NOTE — Telephone Encounter (Signed)
Patient friend calling  States that patient cannot afford this medication, would like to discuss possible other options and see if it is possible to receive more samples Please call to discuss

## 2018-01-31 NOTE — Telephone Encounter (Signed)
I called and spoke with the patient. He is aware I will pull him some samples of Eliquis 5 mg and will place an application for patient assistance in the bag for him to complete the patient portion. He was to bring the form back to our office after he fills out his part so we can complete out section and submit this for him.  He is agreeable and voices understanding.   Eliquis 5 mg Lot: [id number] Exp: 6/21 # 3 boxes

## 2018-02-06 ENCOUNTER — Encounter: Payer: Self-pay | Admitting: *Deleted

## 2018-02-06 NOTE — Telephone Encounter (Signed)
Letter and forms mailed back to the patient with a request to please complete and at least let us try to see if he will qualify for any assistance.  I advised that we cannot keep supplying him with samples of the drug.

## 2018-02-06 NOTE — Telephone Encounter (Signed)
Patient wife dropped off PA forms .  She states they have reviewed the forms and do not think they will qualify.  Placed in nurse box.

## 2018-02-25 ENCOUNTER — Ambulatory Visit (INDEPENDENT_AMBULATORY_CARE_PROVIDER_SITE_OTHER): Payer: Medicare Other | Admitting: *Deleted

## 2018-02-25 DIAGNOSIS — I639 Cerebral infarction, unspecified: Secondary | ICD-10-CM

## 2018-02-25 NOTE — Progress Notes (Signed)
Carelink Summary Report / Loop Recorder 

## 2018-03-05 ENCOUNTER — Telehealth: Payer: Self-pay | Admitting: *Deleted

## 2018-03-05 NOTE — Telephone Encounter (Signed)
Manual transmission received and reviewed. 7 recent "tachy" episodes noted--most ECGs appear AF w/RVR, some with ? aberrancy. ECGs printed and given to Alvis Lemmings, RN, for review by Dr. Caryl Comes during patient's appointment on 03/12/18.

## 2018-03-05 NOTE — Telephone Encounter (Signed)
LMOM (DPR) requesting manual Care[employer] transmission for review. [place] phone number for questions/concerns.

## 2018-03-05 NOTE — Telephone Encounter (Signed)
Spoke w/ pt and instructed him to send a manual transmission w/ his home monitor.  

## 2018-03-08 LAB — CUP PACEART REMOTE DEVICE CHECK
Implantable Pulse Generator Implant Date: 20190110
MDC IDC SESS DTM: 20190724004031

## 2018-03-12 ENCOUNTER — Encounter: Payer: Self-pay | Admitting: Internal Medicine

## 2018-03-12 ENCOUNTER — Ambulatory Visit (INDEPENDENT_AMBULATORY_CARE_PROVIDER_SITE_OTHER): Payer: Medicare Other | Admitting: Internal Medicine

## 2018-03-12 VITALS — BP 118/64 | HR 56 | Ht 69.0 in | Wt 151.5 lb

## 2018-03-12 DIAGNOSIS — Z959 Presence of cardiac and vascular implant and graft, unspecified: Secondary | ICD-10-CM

## 2018-03-12 DIAGNOSIS — I480 Paroxysmal atrial fibrillation: Secondary | ICD-10-CM

## 2018-03-12 DIAGNOSIS — I639 Cerebral infarction, unspecified: Secondary | ICD-10-CM

## 2018-03-12 NOTE — Progress Notes (Signed)
Patient Care Team: Baxter Hire, MD as PCP - General (Internal Medicine) Skeet Latch, MD as PCP - Cardiology (Cardiology)   HPI  Osborne Serio is a 83 y.o. male Seen in followup for LINQ implantation 1/19 for Cryptogenic Stroke>>   transmissions demonstrate atrial fibrillation.  He is here to review.  DATE TEST EF   1/19 Echo   60-65 %   1/19 TEE 65% AI mod        Denies chest pain shortness of breath edema or palpitations.  Continues with some residual visual changes from his TIA; he is also struggled with emotional lability.  This is been embarrassing.  Records and Results Reviewed   Date Cr K Hgb  1/19 1.13    6/19  0.97 4.1    Increasingly active.  Wife is concerned about him trying to push his lawnmower into his truck.  No chest pain or shortness of breath.  No palpitations.  Past Medical History:  Diagnosis Date  . Arthritis   . Cancer ([place])   . History of prostate cancer   . Stroke [facility]) 07/2017    Past Surgical History:  Procedure Laterality Date  . LOOP RECORDER INSERTION N/A 07/11/2017   Procedure: LOOP RECORDER INSERTION;  Surgeon: Constance Haw, MD;  Location: [facility];  Service: Cardiovascular;  Laterality: N/A;  . NO PAST SURGERIES    . TEE WITHOUT CARDIOVERSION N/A 07/11/2017   Procedure: TRANSESOPHAGEAL ECHOCARDIOGRAM (TEE);  Surgeon: Acie Fredrickson Wonda Cheng, MD;  Location: [facility] ENDOSCOPY;  Service: Cardiovascular;  Laterality: N/A;    Current Outpatient Medications  Medication Sig Dispense Refill  . amLODipine-benazepril (LOTREL) 10-20 MG capsule Take 1 capsule by mouth daily.     Marland Kitchen apixaban (ELIQUIS) 5 MG TABS tablet Take 1 tablet (5 mg total) by mouth 2 (two) times daily. 60 tablet 11  . atorvastatin (LIPITOR) 20 MG tablet Take 1 tablet (20 mg total) by mouth daily at 6 PM. 90 tablet 0  . citalopram (CELEXA) 20 MG tablet Take 20 mg by mouth daily.     Marland Kitchen docusate sodium (STOOL SOFTENER) 100 MG capsule Take 200 mg by mouth 2  (two) times daily.    . Ferrous Sulfate (IRON) 325 (65 Fe) MG TABS Take 325 mg by mouth daily.     . hydrochlorothiazide (HYDRODIURIL) 25 MG tablet Take 25 mg by mouth daily.    . metFORMIN (GLUCOPHAGE) 500 MG tablet Take 1 tablet (500 mg) by mouth twice daily    . nitroGLYCERIN (NITROSTAT) 0.4 MG SL tablet Place 1 tablet (0.4 mg total) under the tongue every 5 (five) minutes as needed for chest pain (notify PCP if experiencing chest pain and need to take Nitroglycerin). 30 tablet 1  . vitamin B-12 (CYANOCOBALAMIN) 1000 MCG tablet Take 1,000 mcg by mouth daily.     No current facility-administered medications for this visit.     No Known Allergies    Review of Systems negative except from HPI and PMH  Physical Exam BP 118/64 (BP Location: Left Arm, Patient Position: Sitting, Cuff Size: Normal)   Pulse (!) 56   Ht 5\' 9"  (1.753 m)   Wt 151 lb 8 oz (68.7 kg)   BMI 22.37 kg/m  Well developed and nourished in no acute distress HENT normal Neck supple with JVP-flat Clear Regular rate and rhythm, 2/6 sys m Abd-soft with active BS No Clubbing cyanosis edema Skin-warm and dry A & Oriented  Grossly normal sensory and motor  function    ECG: Sinus Rhythm  @56             Intervals  18/08/40  Axis normal     Assessment and  Plan  Cryptogenic stroke  AS/ AI   Moderate  Hyperlipidemia  Hypertension  Atrial fibrillation/flutter  SVT  Sinus bradycardia   Patient tolerating his blood thinner without bleeding.  Interval atrial arrhythmias appear to be atrial fibrillation on some occasion SVT on others.  For now we will continue anticoagulation.  They were asymptomatic.  He has sinus bradycardia so medical therapy with AV nodal blocking agents will be a challenge.  We spent more than 50% of our >25 min visit in face to face counseling regarding the above

## 2018-03-12 NOTE — Patient Instructions (Addendum)
Medication Instructions: - Your physician recommends that you continue on your current medications as directed. Please refer to the Current Medication list given to you today.  Samples given today: Eliquis 5 mg NOT:[id number] Exp: 11/21 # 2 boxes  Labwork: - none ordered  Procedures/Testing: - none ordered  Follow-Up: - Your physician wants you to follow-up in: 1 year with Dr. Caryl Comes. You will receive a reminder letter/ call in the mail two months in advance. If you don't receive a letter/ call, please call our office to schedule the follow-up appointment.   Any Additional Special Instructions Will Be Listed Below (If Applicable).     If you need a refill on your cardiac medications before your next appointment, please call your pharmacy.

## 2018-03-15 ENCOUNTER — Other Ambulatory Visit: Payer: Self-pay | Admitting: Internal Medicine

## 2018-03-21 ENCOUNTER — Other Ambulatory Visit: Payer: Self-pay | Admitting: *Deleted

## 2018-03-21 ENCOUNTER — Telehealth: Payer: Self-pay | Admitting: Internal Medicine

## 2018-03-21 MED ORDER — APIXABAN 5 MG PO TABS: 5.0000 mg | ORAL_TABLET | Freq: Two times a day (BID) | ORAL | 3 refills | Status: DC

## 2018-03-21 NOTE — Telephone Encounter (Signed)
Patient assistance form received.  Placed in Dr. Olin Pia folder for review and signature.

## 2018-03-21 NOTE — Telephone Encounter (Signed)
Patient family member dropped off Patient Assistance Forms to be completed Placed in nurse box

## 2018-03-26 LAB — CUP PACEART REMOTE DEVICE CHECK
Implantable Pulse Generator Implant Date: 20190110
MDC IDC SESS DTM: 20190826013952

## 2018-03-27 LAB — CUP PACEART INCLINIC DEVICE CHECK
Date Time Interrogation Session: 20190910135300
MDC IDC PG IMPLANT DT: 20190110

## 2018-03-29 ENCOUNTER — Ambulatory Visit (INDEPENDENT_AMBULATORY_CARE_PROVIDER_SITE_OTHER): Payer: Medicare Other | Admitting: *Deleted

## 2018-03-29 DIAGNOSIS — I639 Cerebral infarction, unspecified: Secondary | ICD-10-CM

## 2018-03-29 NOTE — Telephone Encounter (Signed)
Patient assistance application completed and faxed to [place] at [phone]. Confirmation received.

## 2018-03-31 NOTE — Progress Notes (Signed)
Carelink Summary Report / Loop Recorder 

## 2018-04-01 LAB — CUP PACEART REMOTE DEVICE CHECK
Date Time Interrogation Session: 20190928021116
MDC IDC PG IMPLANT DT: 20190110

## 2018-04-04 ENCOUNTER — Telehealth: Payer: Self-pay | Admitting: Internal Medicine

## 2018-04-04 NOTE — Telephone Encounter (Signed)
Please call with status of pt assistance for Eliquis

## 2018-04-05 NOTE — Telephone Encounter (Signed)
Eliquis 5 mg samples provide for patient. Medication Samples have been provided to the patient.  Drug name: Eliquis    Strength: 5 mg       Qty: 56 LOT:  [id number] Exp.Date: 11/21

## 2018-04-05 NOTE — Telephone Encounter (Signed)
Patient came by office Would like to check on status of Patient Assistance forms  Patient provided with Eliquis 5MG  samples Please call to discuss

## 2018-04-08 NOTE — Telephone Encounter (Signed)
I called and spoke with [employer] patient assistance for Eliquis.   They state they were missing the Medicare # for the patient. Medicare ID # given. Per [employer] rep- they will continue processing the patient's application and we will receive a fax response in the next few days.  I have called the patient and notified him of this.  He voices understanding.

## 2018-04-19 ENCOUNTER — Other Ambulatory Visit: Payer: Self-pay | Admitting: Internal Medicine

## 2018-04-19 NOTE — Telephone Encounter (Signed)
I left a message at the patient's home number that I received a fax notification today that he has been approved for Eliquis patient assistance.  Approval dates: 04/18/18-07/02/18.  I have advised him we will notify him if the medication is received in the office, but if he receives this at home I have asked to he call and just let me know.  Application/ approval notification placed in patient assistance file in office.  I asked that he call back with any further questions at this time.

## 2018-04-25 ENCOUNTER — Encounter: Payer: Self-pay | Admitting: Occupational Therapy

## 2018-04-25 NOTE — Therapy (Unsigned)
[place] MAIN [facility] SERVICES [address] [place], [place], [address] Phone: [phone]   Fax:  [phone]  April 25, 2018      Occupational Therapy Discharge Summary   Patient: Jeffrey Rojas MRN: [id number] Date of Birth: [birth year]  Diagnosis: No diagnosis found.  No data recorded  The above patient had been seen in Occupational Therapy for 1 visit.   Pt. attended only the evaluation. The patient is: unchanged  Subjective: Pt. as evaluated by OT services on 07/17/2017. Pt. was seen for his initial visit, and has not returned for rehab services since the initial visit.   OT Long Term Goals            OT LONG TERM GOAL #1   Title  Pt. will be able to independently scan his environment to be able to navigate from one place to another within it.     Baseline  Pt. has difficulty    Time  12    Period  Weeks    Status  New    Target Date  10/02/17        OT LONG TERM GOAL #2   Title  Pt. will be able to independently scan, and perform visual search startegies for tabletop IADL paperwork tasks.    Baseline  Pt. has difficulty    Time  12    Period  Weeks    Target Date  10/02/17        OT LONG TERM GOAL #3   Title  Pt. will demonstrate visual compensatory strategies 100% of the time duirng ADLs, and IADLs, with cues.    Baseline  Pt. has difficulty    Time  12    Period  Weeks    Status  New    Target Date  10/02/17        OT LONG TERM GOAL #4   Title  Pt. will demonstrate cognitive compensatory strategies 100% of the time during ADLs, and IADLs with cues.     Baseline  Pt. has difficulty    Time  12    Period  Weeks    Status  New    Target Date  10/02/17        OT LONG TERM GOAL #5   Title  Pt. will demonstrate independence with light meal preparation.    Baseline  Pt. has difficulty    Time  12    Period  Weeks    Status  New    Target Date  10/02/17        Long Term  Additional Goals   Additional Long Term Goals  Yes        OT LONG TERM GOAL #6   Title Pt. will demonstrate writing with 100% legibility    Baseline  Pt. has difficulty    Time  12    Period  Weeks    Status  New    Target Date  10/02/17            Harrel Carina, [place] MAIN [facility] SERVICES [address] [place], [place], [address] Phone: [phone]   Fax:  [phone]  Patient: Jeffrey Rojas MRN: [id number] Date of Birth: September 09, 1934

## 2018-05-01 ENCOUNTER — Ambulatory Visit (INDEPENDENT_AMBULATORY_CARE_PROVIDER_SITE_OTHER): Payer: Medicare Other | Admitting: *Deleted

## 2018-05-01 DIAGNOSIS — I639 Cerebral infarction, unspecified: Secondary | ICD-10-CM | POA: Diagnosis not present

## 2018-05-03 NOTE — Telephone Encounter (Signed)
Samples given to patient for Eliquis 5 mg 3 boxes of 14 tablets in each; Lot # [id number] Exp. 11/21

## 2018-05-03 NOTE — Telephone Encounter (Signed)
Patient calling the office for samples of medication:   1.  What medication and dosage are you requesting samples for? Eliquis 5 mg po BID   2.  Are you currently out of this medication? Yes   Pending response from [facility] .  Patient in lobby   .

## 2018-05-03 NOTE — Progress Notes (Signed)
Carelink Summary Report / Loop Recorder 

## 2018-05-03 NOTE — Telephone Encounter (Signed)
Please call regarding Status of Eliquis prescription.

## 2018-05-03 NOTE — Telephone Encounter (Signed)
Pt was just calling to see if the medication was at the office. Informed pt that we are covering for them and that they will be back on Monday. He will be out of medication on Sunday and will need samples.   He will call the  off and inquire there if there are samples

## 2018-05-06 NOTE — Telephone Encounter (Addendum)
I spoke with the patient. He has not seen his Eliquis patient assistance arrive at his home address. As of today, I have not seen this in office.  Samples pulled for patient:  Eliquis 5 mg Lot: [id number] Exp: 11/21 # 2 boxes.  The patient is aware these will be placed at the front desk for pick up.

## 2018-05-17 ENCOUNTER — Telehealth: Payer: Self-pay | Admitting: Internal Medicine

## 2018-05-17 MED ORDER — APIXABAN 5 MG PO TABS: 5.0000 mg | ORAL_TABLET | Freq: Two times a day (BID) | ORAL | 1 refills | Status: DC

## 2018-05-17 NOTE — Telephone Encounter (Signed)
°*  STAT* If patient is at the pharmacy, call can be transferred to refill team.   1. Which medications need to be refilled? (please list name of each medication and dose if known)  apixaban (Eliquis) 5 MG  2. Which pharmacy/location (including street and city if local pharmacy) is medication to be sent to? [facility]  3. Do they need a 30 day or 90 day supply? 90 day

## 2018-05-17 NOTE — Telephone Encounter (Signed)
Please review for refill.  

## 2018-05-20 ENCOUNTER — Other Ambulatory Visit: Payer: Self-pay

## 2018-05-20 MED ORDER — APIXABAN 5 MG PO TABS: 5.0000 mg | ORAL_TABLET | Freq: Two times a day (BID) | ORAL | 1 refills | Status: DC

## 2018-05-24 LAB — CUP PACEART REMOTE DEVICE CHECK
Implantable Pulse Generator Implant Date: 20190110
MDC IDC SESS DTM: 20191031020800

## 2018-05-27 ENCOUNTER — Other Ambulatory Visit: Payer: Self-pay

## 2018-05-27 MED ORDER — APIXABAN 5 MG PO TABS: 5.0000 mg | ORAL_TABLET | Freq: Two times a day (BID) | ORAL | 1 refills | Status: DC

## 2018-05-27 NOTE — Telephone Encounter (Signed)
Patient calling to see if he can get an update on refill States pharmacy hasn't received anything   Please advise

## 2018-05-27 NOTE — Telephone Encounter (Addendum)
Refill was sent to [facility] on 05/20/18.  Will resend.  Pt's wt 68.7 kg, age 83, serum creatinine 0.97, CrCl 59.02.

## 2018-06-03 ENCOUNTER — Ambulatory Visit (INDEPENDENT_AMBULATORY_CARE_PROVIDER_SITE_OTHER): Payer: Medicare Other

## 2018-06-03 DIAGNOSIS — I639 Cerebral infarction, unspecified: Secondary | ICD-10-CM | POA: Diagnosis not present

## 2018-06-04 NOTE — Progress Notes (Signed)
Carelink Summary Report / Loop Recorder 

## 2018-06-11 ENCOUNTER — Telehealth: Payer: Self-pay

## 2018-06-11 NOTE — Telephone Encounter (Signed)
Spoke with pt and reminded pt of remote transmission that is due today. Pt verbalized understanding.   

## 2018-06-20 NOTE — Telephone Encounter (Signed)
Spoke with pt and reminded pt of remote transmission that is due today. Pt verbalized understanding.   

## 2018-06-21 NOTE — Telephone Encounter (Signed)
Manual transmission received and reviewed. "Tachy" episodes continue to suggest SVT and PAF. ECGs printed and placed in Dr. Janalyn Harder folder for review.

## 2018-06-24 ENCOUNTER — Telehealth: Payer: Self-pay

## 2018-06-24 NOTE — Telephone Encounter (Signed)
ECG printed and placed in Dr. Janalyn Harder folder for review.

## 2018-06-24 NOTE — Telephone Encounter (Signed)
Called and left patient a message to call the device clinic. Will discuss tachy episodes on 06/23/18.

## 2018-06-24 NOTE — Telephone Encounter (Signed)
Follow Up:   Patient returning call back concerning results.

## 2018-06-24 NOTE — Telephone Encounter (Signed)
Patient returned call and states that he did not have any palpitations, shortness of breath, dizziness, or passing out during tachy episode on 06/23/18. Patient is taking all cardiac medications as prescribed. Advised patient that we will have a doctor review the episode and call back if there are any changes that need to be made.

## 2018-06-27 ENCOUNTER — Telehealth: Payer: Self-pay | Admitting: *Deleted

## 2018-06-27 NOTE — Telephone Encounter (Signed)
Left message for patient to call back regarding a transmission we received from his LINQ monitor on 06/24/18 10:36am.  I asked that he call the device clinic back to discuss symptoms and the need for a manual transmission.

## 2018-06-28 NOTE — Telephone Encounter (Signed)
Spoke w/ pt and he stated that does not recall symptoms on 06-24-2018 at 10:36 AM. Instructed pt how to send the manual transmission. Transmission received. Pt is aware that if anything is abnormal a nurse will call back to discuss otherwise no news is good news.

## 2018-07-01 LAB — CUP PACEART REMOTE DEVICE CHECK
Date Time Interrogation Session: 20191203023927
Implantable Pulse Generator Implant Date: 20190110

## 2018-07-01 NOTE — Telephone Encounter (Signed)
Episode likely AF w/ RVR which is known.

## 2018-07-04 ENCOUNTER — Other Ambulatory Visit: Payer: Self-pay | Admitting: Internal Medicine

## 2018-07-08 ENCOUNTER — Ambulatory Visit (INDEPENDENT_AMBULATORY_CARE_PROVIDER_SITE_OTHER): Payer: Medicare Other

## 2018-07-08 DIAGNOSIS — I639 Cerebral infarction, unspecified: Secondary | ICD-10-CM | POA: Diagnosis not present

## 2018-07-08 LAB — CUP PACEART REMOTE DEVICE CHECK
Date Time Interrogation Session: 20200105030948
Implantable Pulse Generator Implant Date: 20190110

## 2018-07-09 NOTE — Progress Notes (Signed)
Carelink Summary Report / Loop Recorder 

## 2018-08-08 ENCOUNTER — Ambulatory Visit (INDEPENDENT_AMBULATORY_CARE_PROVIDER_SITE_OTHER): Payer: Medicare Other

## 2018-08-08 DIAGNOSIS — I639 Cerebral infarction, unspecified: Secondary | ICD-10-CM | POA: Diagnosis not present

## 2018-08-09 LAB — CUP PACEART REMOTE DEVICE CHECK
Date Time Interrogation Session: 20200207031042
MDC IDC PG IMPLANT DT: 20190110

## 2018-08-12 ENCOUNTER — Telehealth: Payer: Self-pay

## 2018-08-12 NOTE — Telephone Encounter (Signed)
Spoke with pt and helped him send a manual remote transmission with his home monitor.

## 2018-08-13 NOTE — Telephone Encounter (Signed)
Manual transmission received and reviewed. ECGs suggest episodes of SVT, similar to previous episodes. Will contact patient tomorrow regarding any symptoms.

## 2018-08-14 NOTE — Telephone Encounter (Signed)
Spoke with patient. He denies any symptoms of dizziness, chest discomfort, palpitations, or ShOB. He reports he is unaware of tachy episodes. Advised to call our office if he experiences these symptom. Patient verbalizes understanding and denies additional questions or concerns at this time.

## 2018-08-15 ENCOUNTER — Telehealth: Payer: Self-pay

## 2018-08-15 NOTE — Telephone Encounter (Signed)
Pt wanted to know if he had anymore episodes this morning. Jeffrey Rojas looked at the patient transmission and everything looks ok. No other episodes was present since 08/13/2018. I relayed the information to the pt.

## 2018-08-19 NOTE — Progress Notes (Signed)
Carelink Summary Report / Loop Recorder 

## 2018-09-11 ENCOUNTER — Ambulatory Visit (INDEPENDENT_AMBULATORY_CARE_PROVIDER_SITE_OTHER): Payer: Medicare Other | Admitting: *Deleted

## 2018-09-11 DIAGNOSIS — I639 Cerebral infarction, unspecified: Secondary | ICD-10-CM | POA: Diagnosis not present

## 2018-09-12 LAB — CUP PACEART REMOTE DEVICE CHECK
Date Time Interrogation Session: 20200311034334
MDC IDC PG IMPLANT DT: 20190110

## 2018-09-16 ENCOUNTER — Other Ambulatory Visit: Payer: Self-pay | Admitting: Internal Medicine

## 2018-09-17 ENCOUNTER — Other Ambulatory Visit: Payer: Self-pay | Admitting: Internal Medicine

## 2018-09-18 NOTE — Progress Notes (Signed)
Carelink Summary Report / Loop Recorder 

## 2018-10-14 ENCOUNTER — Ambulatory Visit (INDEPENDENT_AMBULATORY_CARE_PROVIDER_SITE_OTHER): Payer: Medicare Other | Admitting: *Deleted

## 2018-10-14 ENCOUNTER — Other Ambulatory Visit: Payer: Self-pay

## 2018-10-14 DIAGNOSIS — I639 Cerebral infarction, unspecified: Secondary | ICD-10-CM

## 2018-10-14 LAB — CUP PACEART REMOTE DEVICE CHECK
Date Time Interrogation Session: 20200413104024
Implantable Pulse Generator Implant Date: 20190110

## 2018-10-24 NOTE — Progress Notes (Signed)
Carelink Summary Report / Loop Recorder 

## 2018-11-18 ENCOUNTER — Ambulatory Visit (INDEPENDENT_AMBULATORY_CARE_PROVIDER_SITE_OTHER): Payer: Medicare Other | Admitting: *Deleted

## 2018-11-18 ENCOUNTER — Other Ambulatory Visit: Payer: Self-pay

## 2018-11-18 DIAGNOSIS — I639 Cerebral infarction, unspecified: Secondary | ICD-10-CM

## 2018-11-18 LAB — CUP PACEART REMOTE DEVICE CHECK
Date Time Interrogation Session: 20200516114128
Implantable Pulse Generator Implant Date: 20190110

## 2018-11-27 NOTE — Progress Notes (Signed)
Carelink Summary Report / Loop Recorder 

## 2018-12-10 ENCOUNTER — Other Ambulatory Visit: Payer: Self-pay

## 2018-12-10 MED ORDER — APIXABAN 5 MG PO TABS: 5.0000 mg | ORAL_TABLET | Freq: Two times a day (BID) | ORAL | 1 refills | Status: DC

## 2018-12-10 NOTE — Telephone Encounter (Signed)
Refill Request.  

## 2018-12-10 NOTE — Telephone Encounter (Signed)
*  STAT* If patient is at the pharmacy, call can be transferred to refill team.   1. Which medications need to be refilled? (please list name of each medication and dose if known) Eliquis  2. Which pharmacy/location (including street and city if local pharmacy) is medication to be sent to?[facility]  3. Do they need a 30 day or 90 day supply? 90  

## 2018-12-11 ENCOUNTER — Telehealth: Payer: Self-pay

## 2018-12-11 NOTE — Telephone Encounter (Signed)
Pt would like Eliquis samples.  

## 2018-12-11 NOTE — Telephone Encounter (Signed)
Patient requesting a few samples of Eliquis 5mg  tablets until his mail order arrives.  Patient is given Eliquis 5 mg.14 tablets of Lot [id number] exp sept 2022

## 2018-12-19 ENCOUNTER — Telehealth: Payer: Self-pay

## 2018-12-19 ENCOUNTER — Ambulatory Visit (INDEPENDENT_AMBULATORY_CARE_PROVIDER_SITE_OTHER): Payer: Medicare Other | Admitting: *Deleted

## 2018-12-19 DIAGNOSIS — I6300 Cerebral infarction due to thrombosis of unspecified precerebral artery: Secondary | ICD-10-CM | POA: Diagnosis not present

## 2018-12-19 LAB — CUP PACEART REMOTE DEVICE CHECK
Date Time Interrogation Session: 20200618120708
Implantable Pulse Generator Implant Date: 20190110

## 2018-12-19 NOTE — Telephone Encounter (Signed)
Pt needed help sending a manual transmission. Transmission received.

## 2018-12-23 NOTE — Progress Notes (Signed)
Carelink Summary Report / Loop Recorder 

## 2019-01-21 ENCOUNTER — Ambulatory Visit (INDEPENDENT_AMBULATORY_CARE_PROVIDER_SITE_OTHER): Payer: Medicare Other | Admitting: *Deleted

## 2019-01-21 DIAGNOSIS — I6300 Cerebral infarction due to thrombosis of unspecified precerebral artery: Secondary | ICD-10-CM | POA: Diagnosis not present

## 2019-01-21 LAB — CUP PACEART REMOTE DEVICE CHECK
Date Time Interrogation Session: 20200721141010
Implantable Pulse Generator Implant Date: 20190110

## 2019-02-05 NOTE — Progress Notes (Signed)
Carelink Summary Report / Loop Recorder 

## 2019-02-21 ENCOUNTER — Telehealth: Payer: Self-pay

## 2019-02-21 NOTE — Telephone Encounter (Signed)
Left message for patient to regarding disconnected monitor 

## 2019-02-24 ENCOUNTER — Ambulatory Visit (INDEPENDENT_AMBULATORY_CARE_PROVIDER_SITE_OTHER): Payer: Medicare Other | Admitting: *Deleted

## 2019-02-24 DIAGNOSIS — I6300 Cerebral infarction due to thrombosis of unspecified precerebral artery: Secondary | ICD-10-CM | POA: Diagnosis not present

## 2019-02-25 LAB — CUP PACEART REMOTE DEVICE CHECK
Date Time Interrogation Session: 20200823154005
Implantable Pulse Generator Implant Date: 20190110

## 2019-03-03 NOTE — Progress Notes (Signed)
Carelink Summary Report / Loop Recorder 

## 2019-03-06 ENCOUNTER — Encounter: Payer: Self-pay | Admitting: Cardiology

## 2019-03-17 ENCOUNTER — Other Ambulatory Visit: Payer: Self-pay | Admitting: Internal Medicine

## 2019-03-17 NOTE — Telephone Encounter (Signed)
Patient calling to check on status.

## 2019-03-17 NOTE — Telephone Encounter (Signed)
°*  STAT* If patient is at the pharmacy, call can be transferred to refill team.   1. Which medications need to be refilled? (please list name of each medication and dose if known) Eliquis 5 mg bid  2. Which pharmacy/location (including street and city if local pharmacy) is [facility] to [facility] to[facility]  3. Do they need a 30 day or 90 day supply? [place]

## 2019-03-17 NOTE — Telephone Encounter (Signed)
Patient only has 2 days more of Eliquis left, might need a sample to cover the gap if able, please advise

## 2019-03-17 NOTE — Telephone Encounter (Signed)
Patient notified samples of Eliquis 5 mg available to pick up.  Medication Samples have been provided to the patient.  Drug name: Eliquis      Strength: 5 mg  Qty: 14 tablets          (1 Box )  LOT: [id number] Exp.Date: 12/2020   Dolores Lory 12:02 PM 03/17/2019

## 2019-03-18 ENCOUNTER — Telehealth: Payer: Self-pay | Admitting: Internal Medicine

## 2019-03-18 NOTE — Telephone Encounter (Signed)
Patient calling in again regarding eliquis and would like to speak with a nurse

## 2019-03-18 NOTE — Telephone Encounter (Signed)
Please call to discuss PA for Eliquis

## 2019-03-19 ENCOUNTER — Other Ambulatory Visit: Payer: Self-pay | Admitting: Internal Medicine

## 2019-03-19 DIAGNOSIS — Z7901 Long term (current) use of anticoagulants: Secondary | ICD-10-CM

## 2019-03-19 MED ORDER — APIXABAN 5 MG PO TABS: 5.0000 mg | ORAL_TABLET | Freq: Two times a day (BID) | ORAL | 0 refills | Status: DC

## 2019-03-19 NOTE — Telephone Encounter (Signed)
Fax was laying on my desk this morning when I returned to work from [employer] about the patient needing a PA for eliquis. The fax states that "we recently contacted your office to obtain information regarding your patient's request for coverage of Eliquis 5 mg tablet..We were unable to approve this request for the following reasons: we were unable to reach your doctor in order to complete the coverage review process."  There is no documentation in the patient's chart that we received a PA request and nothing current/ active in CoverMyMeds.   PA submitted electronically thru CoverMyMeds.   Awaiting response.  I have called the patient and advised him of the above information.  He voices understanding and is agreeable. He states he has about a weeks worth of medication left.

## 2019-03-19 NOTE — Telephone Encounter (Signed)
Pt's age 84, wt 68.7 kg, unable to calculate CrCl, as pt is overdue for labs to assess kidney function.  Pt has appt w/ Dr. Caryl Comes 04/01/19 - will need labs at that ov. Will send in 30 day supply of Eliquis.

## 2019-03-19 NOTE — Telephone Encounter (Signed)
°*  STAT* If patient is at the pharmacy, call can be transferred to refill team.   1. Which medications need to be refilled? (please list name of each medication and dose if known) apixaban (ELIQUIS) 5 MG  2. Which pharmacy/location (including street and city if local pharmacy) is medication to be sent to? [facility] on [address]   3. Do they need a 30 day or 90 day supply? 30 day

## 2019-03-19 NOTE — Telephone Encounter (Signed)
Refill request

## 2019-03-26 ENCOUNTER — Telehealth: Payer: Self-pay | Admitting: Internal Medicine

## 2019-03-26 ENCOUNTER — Other Ambulatory Visit: Payer: Self-pay | Admitting: Internal Medicine

## 2019-03-26 MED ORDER — APIXABAN 5 MG PO TABS: 5.0000 mg | ORAL_TABLET | Freq: Two times a day (BID) | ORAL | 0 refills | Status: DC

## 2019-03-26 NOTE — Telephone Encounter (Signed)
°*  STAT* If patient is at the pharmacy, call can be transferred to refill team.   1. Which medications need to be refilled? (please list name of each medication and dose if known) eliquis 5mg  bid  2. Which pharmacy/location (including street and city if local pharmacy) is medication to be sent to? [facility]  3. Do they need a 30 day or 90 day supply? Jeffrey Rojas

## 2019-03-26 NOTE — Telephone Encounter (Signed)
Please review for refill, Thanks !  

## 2019-03-26 NOTE — Telephone Encounter (Signed)
Patient calling the office for samples of medication:   1.  What medication and dosage are you requesting samples for? Eliquis 5 mg bid  2.  Are you currently out of this medication? Yes

## 2019-03-26 NOTE — Telephone Encounter (Signed)
71m Lovw/klein 03/12/18 68.7kg Scr 0.97 12/11/17 outdated Will authorixe 1 month refill w/note to pharmacy stating that labs needed for further refills

## 2019-03-26 NOTE — Telephone Encounter (Signed)
Medication Samples have been provided to the patient until mail order arrives.  Drug name: Eliquis      Strength: 5mg         Qty: 14  LOT: [id number]  Exp.Date: Dec 2022  Horton Finer 10:47 AM 03/26/2019

## 2019-03-26 NOTE — Telephone Encounter (Signed)
Received fax from [employer] requesting to be called at [phone] to complete the coverage review process for Eliquis. Spoke with Delilah Shan to answer questions verbally. Eliquis has been approved Case # [id number] from 02/24/2019 through 03/25/2020. Patient informed.

## 2019-03-27 ENCOUNTER — Encounter: Payer: Self-pay | Admitting: Cardiology

## 2019-03-28 ENCOUNTER — Encounter: Payer: Medicare Other | Admitting: *Deleted

## 2019-04-01 ENCOUNTER — Ambulatory Visit (INDEPENDENT_AMBULATORY_CARE_PROVIDER_SITE_OTHER): Payer: Medicare Other | Admitting: Internal Medicine

## 2019-04-01 ENCOUNTER — Other Ambulatory Visit: Payer: Self-pay

## 2019-04-01 ENCOUNTER — Encounter: Payer: Self-pay | Admitting: Internal Medicine

## 2019-04-01 VITALS — BP 138/68 | HR 90 | Ht 69.0 in | Wt 152.0 lb

## 2019-04-01 DIAGNOSIS — Z959 Presence of cardiac and vascular implant and graft, unspecified: Secondary | ICD-10-CM | POA: Diagnosis not present

## 2019-04-01 DIAGNOSIS — I639 Cerebral infarction, unspecified: Secondary | ICD-10-CM | POA: Diagnosis not present

## 2019-04-01 DIAGNOSIS — I480 Paroxysmal atrial fibrillation: Secondary | ICD-10-CM | POA: Diagnosis not present

## 2019-04-03 NOTE — Patient Instructions (Signed)
Medication Instructions:  - Your physician recommends that you continue on your current medications as directed. Please refer to the Current Medication list given to you today.  If you need a refill on your cardiac medications before your next appointment, please call your pharmacy.   Lab work: - none ordered  If you have labs (blood work) drawn today and your tests are completely normal, you will receive your results only by: Marland Kitchen MyChart Message (if you have MyChart) OR . A paper copy in the mail If you have any lab test that is abnormal or we need to change your treatment, we will call you to review the results.  Testing/Procedures: - none ordered  Follow-Up: At [facility], you and your health needs are our priority.  As part of our continuing mission to provide you with exceptional heart care, we have created designated Provider Care Teams.  These Care Teams include your primary Cardiologist (physician) and Advanced Practice Providers (APPs -  Physician Assistants and Nurse Practitioners) who all work together to provide you with the care you need, when you need it.  You will need a follow up appointment in 1 year with Dr. Caryl Comes (late September/ early October 2021).  . Please call our office 2 months in advance to schedule this appointment.  (Call in early July 2021 to schedule)  Any Other Special Instructions Will Be Listed Below (If Applicable). - N/A

## 2019-04-04 ENCOUNTER — Telehealth: Payer: Self-pay

## 2019-04-04 NOTE — Telephone Encounter (Signed)
Pt tried to send a manual transmission but was unsuccessful. The monitor would not turn on and he checked the wall outlet to make sure it was plugged in. I gave him the number to [place] support to get additional help.

## 2019-04-05 NOTE — Progress Notes (Signed)
Patient Care Team: Baxter Hire, MD as PCP - General (Internal Medicine) Skeet Latch, MD as PCP - Cardiology (Cardiology)   HPI  Jeffrey Rojas is a 84 y.o. male Seen in followup for LINQ implantation 1/19 for Cryptogenic Stroke>>   transmissions demonstrate atrial fibrillation.  >> anticoagulation with apixoban   DATE TEST EF   1/19 Echo   60-65 %   1/19 TEE 65% AI mod           Records and Results Reviewed   Date Cr K Hgb  1/19 1.13    6/19  0.97 4.1   7/20 1.1 4.0 14.2 (4/20)     The patient denies chest pain, shortness of breath, nocturnal dyspnea, orthopnea or peripheral edema.  There have been no palpitations, lightheadedness or syncope.    No bleeding  Past Medical History:  Diagnosis Date  . Arthritis   . Cancer ([place])   . History of prostate cancer   . Stroke [facility]) 07/2017    Past Surgical History:  Procedure Laterality Date  . LOOP RECORDER INSERTION N/A 07/11/2017   Procedure: LOOP RECORDER INSERTION;  Surgeon: Constance Haw, MD;  Location: [place] [facility];  Service: Cardiovascular;  Laterality: N/A;  . NO PAST SURGERIES    . TEE WITHOUT CARDIOVERSION N/A 07/11/2017   Procedure: TRANSESOPHAGEAL ECHOCARDIOGRAM (TEE);  Surgeon: Acie Fredrickson Wonda Cheng, MD;  Location: [facility] ENDOSCOPY;  Service: Cardiovascular;  Laterality: N/A;    Current Outpatient Medications  Medication Sig Dispense Refill  . amLODipine-benazepril (LOTREL) 10-20 MG capsule Take 1 capsule by mouth daily.     Marland Kitchen apixaban (ELIQUIS) 5 MG TABS tablet Take 1 tablet (5 mg total) by mouth 2 (two) times daily. LABS NEEDED FOR FURTHER REFILLS AND A CARDIOLOGY APPOINTMENT 60 tablet 0  . docusate sodium (STOOL SOFTENER) 100 MG capsule Take 200 mg by mouth 2 (two) times daily.    . Ferrous Sulfate (IRON) 325 (65 Fe) MG TABS Take 325 mg by mouth daily.     . hydrochlorothiazide (HYDRODIURIL) 25 MG tablet Take 25 mg by mouth daily.    . metFORMIN (GLUCOPHAGE) 500 MG tablet Take 1 tablet  (500 mg) by mouth twice daily    . nitroGLYCERIN (NITROSTAT) 0.4 MG SL tablet Place 1 tablet (0.4 mg total) under the tongue every 5 (five) minutes as needed for chest pain (notify PCP if experiencing chest pain and need to take Nitroglycerin). 30 tablet 1  . vitamin B-12 (CYANOCOBALAMIN) 1000 MCG tablet Take 1,000 mcg by mouth daily.    Marland Kitchen atorvastatin (LIPITOR) 20 MG tablet Take 1 tablet (20 mg total) by mouth daily at 6 PM. 90 tablet 0  . citalopram (CELEXA) 20 MG tablet Take 20 mg by mouth daily.      No current facility-administered medications for this visit.     No Known Allergies    Review of Systems negative except from HPI and PMH  Physical Exam BP 138/68 (BP Location: Left Arm, Patient Position: Sitting, Cuff Size: Normal)   Pulse 90   Ht 5\' 9"  (1.753 m)   Wt 152 lb (68.9 kg)   SpO2 98%   BMI 22.45 kg/m  Well developed and nourished in no acute distress HENT normal Neck supple with JVP  Clear Regular rate and rhythm, 2/6  Murmur  Abd-soft with active BS No Clubbing cyanosis edema Skin-warm and dry A & Oriented  Grossly normal sensory and motor function  Assessment and  Plan  Cryptogenic stroke  Loop recorder in place   AS/ AI   Moderate  Hyperlipidemia  Hypertension  Atrial fibrillation/flutter  SVT   No intercurrent atrial fibrillation or flutter  On Anticoagulation;  No bleeding issues

## 2019-04-15 LAB — CUP PACEART INCLINIC DEVICE CHECK
Date Time Interrogation Session: 20200929164531
Implantable Pulse Generator Implant Date: 20190110

## 2019-04-30 ENCOUNTER — Ambulatory Visit (INDEPENDENT_AMBULATORY_CARE_PROVIDER_SITE_OTHER): Payer: Medicare Other | Admitting: *Deleted

## 2019-04-30 DIAGNOSIS — I480 Paroxysmal atrial fibrillation: Secondary | ICD-10-CM

## 2019-04-30 DIAGNOSIS — I6300 Cerebral infarction due to thrombosis of unspecified precerebral artery: Secondary | ICD-10-CM

## 2019-05-01 LAB — CUP PACEART REMOTE DEVICE CHECK
Date Time Interrogation Session: 20201028215737
Implantable Pulse Generator Implant Date: 20190110

## 2019-05-14 NOTE — Progress Notes (Signed)
Carelink Summary Report / Loop Recorder 

## 2019-05-19 ENCOUNTER — Telehealth: Payer: Self-pay

## 2019-05-19 NOTE — Telephone Encounter (Signed)
Left message for patient regarding disconnected monitor.  

## 2019-06-02 ENCOUNTER — Ambulatory Visit (INDEPENDENT_AMBULATORY_CARE_PROVIDER_SITE_OTHER): Payer: Medicare Other | Admitting: *Deleted

## 2019-06-02 DIAGNOSIS — I480 Paroxysmal atrial fibrillation: Secondary | ICD-10-CM

## 2019-06-03 LAB — CUP PACEART REMOTE DEVICE CHECK
Date Time Interrogation Session: 20201130165914
Implantable Pulse Generator Implant Date: 20190110

## 2019-07-07 ENCOUNTER — Ambulatory Visit (INDEPENDENT_AMBULATORY_CARE_PROVIDER_SITE_OTHER): Payer: Medicare Other | Admitting: *Deleted

## 2019-07-07 DIAGNOSIS — I480 Paroxysmal atrial fibrillation: Secondary | ICD-10-CM

## 2019-07-08 LAB — CUP PACEART REMOTE DEVICE CHECK
Date Time Interrogation Session: 20210102171744
Implantable Pulse Generator Implant Date: 20190110

## 2019-07-28 ENCOUNTER — Telehealth: Payer: Self-pay

## 2019-07-28 NOTE — Telephone Encounter (Signed)
Pt called to reschedule his appointment for 08/13/2018. I changed it for him.

## 2019-07-30 ENCOUNTER — Telehealth: Payer: Self-pay | Admitting: Internal Medicine

## 2019-07-30 ENCOUNTER — Other Ambulatory Visit: Payer: Self-pay | Admitting: Internal Medicine

## 2019-07-30 ENCOUNTER — Other Ambulatory Visit: Payer: Self-pay

## 2019-07-30 MED ORDER — APIXABAN 5 MG PO TABS: 5.0000 mg | ORAL_TABLET | Freq: Two times a day (BID) | ORAL | 1 refills | Status: DC

## 2019-07-30 NOTE — Telephone Encounter (Signed)
Pt's age 85, wt 68.9 kg, SCr 1.0, CrCl 55.5, last ov w/ SK 04/01/19. Refill sent in as requested.

## 2019-07-30 NOTE — Telephone Encounter (Signed)
New rx sent to [facility] as requested.

## 2019-07-30 NOTE — Telephone Encounter (Signed)
*  STAT* If patient is at the pharmacy, call can be transferred to refill team.   1. Which medications need to be refilled? (please list name of each medication and dose if known) Eliquis 22mh bid  2. Which pharmacy/location (including street and city if local pharmacy) is medication to be sent to? [facility]  3. Do they need a 30 day or 90 day supply? [place]

## 2019-07-30 NOTE — Telephone Encounter (Signed)
Provided patient with samples.   Medication Samples have been provided to the patient.  Drug name: ELIQUIS       Strength: 5MG      Qty: 2 boxes LOT: [id number]  Exp.Date: 01/23   [name] 4:39 PM 07/30/2019

## 2019-07-30 NOTE — Telephone Encounter (Signed)
Patient calling the office for samples of medication:   1.  What medication and dosage are you requesting samples for? Eliquis 5 mg po q d   2.  Are you currently out of this medication?  Yes   Patient in lobby.  [place] collected samples. Given to patient .

## 2019-07-30 NOTE — Telephone Encounter (Signed)
°*  STAT* If patient is at the pharmacy, call can be transferred to refill team.   1. Which medications need to be refilled? (please list name of each medication and dose if known) Eliquis   2. Which pharmacy/location (including street and city if local pharmacy) is medication to be sent to? [facility]   3. Do they need a 30 day or 90 day supply? [place]

## 2019-07-31 NOTE — Telephone Encounter (Signed)
Made in error

## 2019-08-07 ENCOUNTER — Ambulatory Visit (INDEPENDENT_AMBULATORY_CARE_PROVIDER_SITE_OTHER): Payer: Medicare Other | Admitting: *Deleted

## 2019-08-07 ENCOUNTER — Telehealth: Payer: Self-pay | Admitting: Internal Medicine

## 2019-08-07 DIAGNOSIS — I480 Paroxysmal atrial fibrillation: Secondary | ICD-10-CM | POA: Diagnosis not present

## 2019-08-07 NOTE — Telephone Encounter (Signed)
error 

## 2019-08-07 NOTE — Telephone Encounter (Signed)
I spoke to [facility] they mentioned that pt isn't needing PA at this time. Please advise for pt's Eliquis 5 mg bid. Pt only has part B coverage and his insurance doesn't cover Part D medications.

## 2019-08-07 NOTE — Telephone Encounter (Signed)
Patient states Prior auth needed please call asap as patient is running out of meds.

## 2019-08-07 NOTE — Telephone Encounter (Signed)
°*  STAT* If patient is at the pharmacy, call can be transferred to refill team.   1. Which medications need to be refilled? (please list name of each medication and dose if known) Eliquis 5mg  bid  2. Which pharmacy/location (including street and city if local pharmacy) [facility] [place]  3. Do they need a 30 day or 90 day supply? 90  States [facility] would not fill the medication

## 2019-08-07 NOTE — Telephone Encounter (Signed)
Patient came by office to check on status of medication  Please call [facility]

## 2019-08-07 NOTE — Telephone Encounter (Signed)
No answer/Lmovm.

## 2019-08-07 NOTE — Telephone Encounter (Signed)
If he does not have Part D coverage- we may need to get him started with patient assistance.  Do you mind calling him to follow up. His Eliquis RX was sent to [facility] on 1/27- did he not get it from there yet?

## 2019-08-08 LAB — CUP PACEART REMOTE DEVICE CHECK
Date Time Interrogation Session: 20210204171728
Implantable Pulse Generator Implant Date: 20190109190000

## 2019-08-12 NOTE — Telephone Encounter (Signed)
Lmovm to verify if pt has received medication.

## 2019-08-13 MED ORDER — APIXABAN 5 MG PO TABS: 5.0000 mg | ORAL_TABLET | Freq: Two times a day (BID) | ORAL | 6 refills | Status: DC

## 2019-08-13 NOTE — Telephone Encounter (Signed)
Please see note below. 

## 2019-08-13 NOTE — Telephone Encounter (Signed)
RX for Eliquis 5 mg BID [facility].   I called the patient and made him aware. I have advised the patient if he has any issues getting this to please let me know.   The patient voices understanding.

## 2019-08-13 NOTE — Telephone Encounter (Signed)
Patient calling back stating he has still not received his medication.   Please advise

## 2019-08-13 NOTE — Telephone Encounter (Signed)
Please review. Patient still has not received medication.  Patient does not have prescription drug coverage insurance.

## 2019-08-14 ENCOUNTER — Other Ambulatory Visit: Payer: Self-pay

## 2019-08-14 ENCOUNTER — Ambulatory Visit (INDEPENDENT_AMBULATORY_CARE_PROVIDER_SITE_OTHER): Payer: Medicare Other | Admitting: *Deleted

## 2019-08-14 DIAGNOSIS — I639 Cerebral infarction, unspecified: Secondary | ICD-10-CM

## 2019-08-14 DIAGNOSIS — I480 Paroxysmal atrial fibrillation: Secondary | ICD-10-CM

## 2019-08-14 LAB — CUP PACEART INCLINIC DEVICE CHECK
Date Time Interrogation Session: 20210211092149
Implantable Pulse Generator Implant Date: 20190110

## 2019-08-14 NOTE — Progress Notes (Signed)
Loop check in clinic. Battery status: good. R-waves 0.55mV. 0 symptom episodes, 15 tachy episodes (known, reprogrammed tachy detection from 171 bpm to 150 bpm per Dr. Caryl Comes), 0 pause episodes, 0 brady episodes. 0 AF episodes (0% burden). Monthly summary reports and ROV with Dr. Caryl Comes.

## 2019-09-08 ENCOUNTER — Ambulatory Visit (INDEPENDENT_AMBULATORY_CARE_PROVIDER_SITE_OTHER): Payer: Medicare Other | Admitting: *Deleted

## 2019-09-08 DIAGNOSIS — I480 Paroxysmal atrial fibrillation: Secondary | ICD-10-CM | POA: Diagnosis not present

## 2019-09-08 LAB — CUP PACEART REMOTE DEVICE CHECK
Date Time Interrogation Session: 20210308023921
Implantable Pulse Generator Implant Date: 20190110

## 2019-09-09 NOTE — Progress Notes (Signed)
ILR Remote 

## 2019-10-09 ENCOUNTER — Ambulatory Visit (INDEPENDENT_AMBULATORY_CARE_PROVIDER_SITE_OTHER): Payer: Medicare Other | Admitting: *Deleted

## 2019-10-09 DIAGNOSIS — I480 Paroxysmal atrial fibrillation: Secondary | ICD-10-CM | POA: Diagnosis not present

## 2019-10-09 LAB — CUP PACEART REMOTE DEVICE CHECK
Date Time Interrogation Session: 20210408034122
Implantable Pulse Generator Implant Date: 20190110

## 2019-10-09 NOTE — Progress Notes (Signed)
ILR Remote 

## 2019-10-21 ENCOUNTER — Telehealth: Payer: Self-pay

## 2019-10-21 NOTE — Telephone Encounter (Signed)
LMOVM. DC hours and direct phone number provided.

## 2019-10-22 NOTE — Telephone Encounter (Signed)
-----   Message from Deboraha Sprang, MD sent at 10/11/2019 11:49 AM EDT ----- Normal device transmission    Significant Arrhythmia >> SVT   Can we cal pt please and see if he has had any symptoms with these events Thanks SK

## 2019-10-22 NOTE — Telephone Encounter (Signed)
Called patient to assess. Patient denies recalling any symptoms during this time. States he has felt good. Patient reports of compliance with medications and no missed doses. Advised patient to call DC back if he has any further questions or concerns. Verbalizes understanding.

## 2019-10-28 ENCOUNTER — Other Ambulatory Visit: Payer: Self-pay | Admitting: Internal Medicine

## 2019-10-28 NOTE — Telephone Encounter (Signed)
Eliquis 5mg  refill request received. Patient is 85 years old, weight-68.9kg, Crea- 1.0 on 08/05/2019 via [facility] from [place], [place], CVA, and last seen by Dr. Caryl Comes on 04/01/2019. Dose is appropriate based on dosing criteria. Will send in refill to requested pharmacy.

## 2019-11-09 LAB — CUP PACEART REMOTE DEVICE CHECK
Date Time Interrogation Session: 20210509034531
Implantable Pulse Generator Implant Date: 20190110

## 2019-11-11 ENCOUNTER — Ambulatory Visit (INDEPENDENT_AMBULATORY_CARE_PROVIDER_SITE_OTHER): Payer: Medicare Other | Admitting: *Deleted

## 2019-11-11 DIAGNOSIS — I6300 Cerebral infarction due to thrombosis of unspecified precerebral artery: Secondary | ICD-10-CM | POA: Diagnosis not present

## 2019-11-13 NOTE — Progress Notes (Signed)
Carelink Summary Report / Loop Recorder 

## 2019-12-15 ENCOUNTER — Ambulatory Visit (INDEPENDENT_AMBULATORY_CARE_PROVIDER_SITE_OTHER): Payer: Medicare Other | Admitting: *Deleted

## 2019-12-15 DIAGNOSIS — I6300 Cerebral infarction due to thrombosis of unspecified precerebral artery: Secondary | ICD-10-CM

## 2019-12-15 LAB — CUP PACEART REMOTE DEVICE CHECK
Date Time Interrogation Session: 20210613234705
Implantable Pulse Generator Implant Date: 20190110

## 2019-12-17 NOTE — Progress Notes (Signed)
Carelink Summary Report / Loop Recorder 

## 2019-12-24 ENCOUNTER — Other Ambulatory Visit: Payer: Self-pay | Admitting: Internal Medicine

## 2020-01-19 ENCOUNTER — Ambulatory Visit (INDEPENDENT_AMBULATORY_CARE_PROVIDER_SITE_OTHER): Payer: Medicare Other | Admitting: *Deleted

## 2020-01-19 DIAGNOSIS — I6300 Cerebral infarction due to thrombosis of unspecified precerebral artery: Secondary | ICD-10-CM

## 2020-01-19 LAB — CUP PACEART REMOTE DEVICE CHECK
Date Time Interrogation Session: 20210718231118
Implantable Pulse Generator Implant Date: 20190110

## 2020-01-21 NOTE — Progress Notes (Signed)
Carelink Summary Report / Loop Recorder 

## 2020-02-22 LAB — CUP PACEART REMOTE DEVICE CHECK
Date Time Interrogation Session: 20210819001011
Implantable Pulse Generator Implant Date: 20190110

## 2020-02-23 ENCOUNTER — Ambulatory Visit (INDEPENDENT_AMBULATORY_CARE_PROVIDER_SITE_OTHER): Payer: Medicare Other | Admitting: *Deleted

## 2020-02-23 DIAGNOSIS — I6300 Cerebral infarction due to thrombosis of unspecified precerebral artery: Secondary | ICD-10-CM

## 2020-02-27 NOTE — Progress Notes (Signed)
Carelink Summary Report / Loop Recorder 

## 2020-03-29 ENCOUNTER — Ambulatory Visit (INDEPENDENT_AMBULATORY_CARE_PROVIDER_SITE_OTHER): Payer: Medicare Other | Admitting: Emergency Medicine

## 2020-03-29 DIAGNOSIS — I6300 Cerebral infarction due to thrombosis of unspecified precerebral artery: Secondary | ICD-10-CM | POA: Diagnosis not present

## 2020-03-29 LAB — CUP PACEART REMOTE DEVICE CHECK
Date Time Interrogation Session: 20210919002923
Implantable Pulse Generator Implant Date: 20190110

## 2020-03-31 NOTE — Progress Notes (Signed)
Carelink Summary Report / Loop Recorder 

## 2020-04-21 ENCOUNTER — Ambulatory Visit (INDEPENDENT_AMBULATORY_CARE_PROVIDER_SITE_OTHER): Payer: Medicare Other

## 2020-04-21 DIAGNOSIS — I6300 Cerebral infarction due to thrombosis of unspecified precerebral artery: Secondary | ICD-10-CM | POA: Diagnosis not present

## 2020-04-21 LAB — CUP PACEART REMOTE DEVICE CHECK
Date Time Interrogation Session: 20211020021327
Implantable Pulse Generator Implant Date: 20190110

## 2020-04-27 NOTE — Progress Notes (Signed)
Carelink Summary Report / Loop Recorder 

## 2020-05-12 ENCOUNTER — Telehealth: Payer: Self-pay

## 2020-05-12 NOTE — Telephone Encounter (Signed)
Attempted phone call to pt and left voicemail message to contact RN at [phone]. 

## 2020-05-12 NOTE — Telephone Encounter (Signed)
Spoke with pt who states he does not recall any recent symptoms of feeling rapid heart beat, chest pain, sweating dizziness or fainting.  Reviewed ED precautions.  Pt verbalizes understanding and thanked Therapist, sports for call.

## 2020-05-12 NOTE — Telephone Encounter (Signed)
-----   Message from Deboraha Sprang, MD sent at 05/11/2020  8:45 PM EST ----- M  good morning   if you have a moment please could you call Mr Jeffrey Rojas and ask him if his SVT episodes detected on his loop have been symptomatic Thanks SK

## 2020-05-12 NOTE — Telephone Encounter (Signed)
Patient is returning call.  °

## 2020-05-17 NOTE — Telephone Encounter (Signed)
Lets jsut follow

## 2020-05-24 ENCOUNTER — Ambulatory Visit (INDEPENDENT_AMBULATORY_CARE_PROVIDER_SITE_OTHER): Payer: Medicare Other

## 2020-05-24 DIAGNOSIS — I6300 Cerebral infarction due to thrombosis of unspecified precerebral artery: Secondary | ICD-10-CM

## 2020-05-24 LAB — CUP PACEART REMOTE DEVICE CHECK
Date Time Interrogation Session: 20211120013247
Implantable Pulse Generator Implant Date: 20190110

## 2020-05-26 NOTE — Progress Notes (Signed)
Carelink Summary Report / Loop Recorder 

## 2020-06-01 ENCOUNTER — Telehealth: Payer: Self-pay | Admitting: Pharmacist

## 2020-06-01 DIAGNOSIS — I6300 Cerebral infarction due to thrombosis of unspecified precerebral artery: Secondary | ICD-10-CM

## 2020-06-01 DIAGNOSIS — I480 Paroxysmal atrial fibrillation: Secondary | ICD-10-CM

## 2020-06-01 MED ORDER — APIXABAN 5 MG PO TABS: 5.0000 mg | ORAL_TABLET | Freq: Two times a day (BID) | ORAL | 0 refills | Status: DC

## 2020-06-01 NOTE — Telephone Encounter (Signed)
Received letter from [employer] provider they will no longer cover Eliquis in 2022.  Sent 90 day supply of Eliquis to pharmacy and left message on patient machine that we will complete PA in 2022

## 2020-06-19 ENCOUNTER — Other Ambulatory Visit: Payer: Self-pay | Admitting: Internal Medicine

## 2020-06-19 DIAGNOSIS — I480 Paroxysmal atrial fibrillation: Secondary | ICD-10-CM

## 2020-06-21 ENCOUNTER — Telehealth: Payer: Self-pay | Admitting: Internal Medicine

## 2020-06-21 NOTE — Telephone Encounter (Signed)
Age 85, weight 69kg, SCr 1 on 05/06/20 at [facility] in [place], afib indication, pt overdue to be seen. Last visit Sept 2020, no f/u scheduled. Will send in refill with msg for pt to schedule f/u and will also send message to scheduling to reach out to pt

## 2020-06-21 NOTE — Telephone Encounter (Signed)
I called and spoke with the patient's caregiver, Barbaraann Share (ok per [facility]).   She advised the patient was called by our office this morning to schedule an appointment. The patient had not had his morning medications and this upset his nerves. He called her crying and she thought something was wrong, the patient is s/p CVA from some time ago. However, Barbaraann Share found out he was just upset due to our call coming before his meds. She reports he is fine and that he is currently not having any issues.  Barbaraann Share was aware of the 07/13/20 appointment with Dr. Caryl Comes and grateful for the call back.

## 2020-06-21 NOTE — Telephone Encounter (Signed)
Patients care taker calling in stating patient woke up yesterday feeling "so  Bad" and staggered a little. Patient did tell care taker he has fallen recently and she is concerned. Patient has eben scheduled for 1//11 but would love to come in sooner

## 2020-06-21 NOTE — Telephone Encounter (Signed)
Cell number is [phone]

## 2020-06-22 ENCOUNTER — Ambulatory Visit (INDEPENDENT_AMBULATORY_CARE_PROVIDER_SITE_OTHER): Payer: Medicare Other

## 2020-06-22 DIAGNOSIS — I6300 Cerebral infarction due to thrombosis of unspecified precerebral artery: Secondary | ICD-10-CM

## 2020-06-22 LAB — CUP PACEART REMOTE DEVICE CHECK
Date Time Interrogation Session: 20211221013317
Implantable Pulse Generator Implant Date: 20190110

## 2020-07-06 NOTE — Progress Notes (Signed)
Carelink Summary Report / Loop Recorder 

## 2020-07-08 ENCOUNTER — Telehealth: Payer: Self-pay | Admitting: Pharmacist

## 2020-07-08 NOTE — Telephone Encounter (Signed)
Called pt to obtain 2022 prescription coverage for Eliquis continuation of therapy.  [facility] ID [id number] BIN [id number] PCN [id number] [id number]  Prior authorization has been submitted.

## 2020-07-12 NOTE — Progress Notes (Signed)
Patient Care Team: Baxter Hire, MD as PCP - General (Internal Medicine) Skeet Latch, MD as PCP - Cardiology (Cardiology)   HPI  Jeffrey Rojas is a 86 y.o. male Seen in followup for LINQ implantation 1/19 for Cryptogenic Stroke>>   transmissions demonstrate atrial fibrillation.  >> anticoagulation with apixoban  Intercurrent SVT The patient denies chest pain, shortness of breath, nocturnal dyspnea, orthopnea or peripheral edema.  There have been no palpitations, lightheadedness or syncope.    Issues with ongoing crying- not sure why.  Not specifically related to regrets  Takes Celexa not sure why  Losing weight (160>>140 1/19>>1/22) with decreased appetite    DATE TEST EF   1/19 Echo   60-65 %   1/19 TEE 65% AI mod           Records and Results Reviewed   Date Cr K Hgb  1/19 1.13    6/19  0.97 4.1   7/20 1.1 4.0 14.2 (4/20)  11/21 1.0 4.3 12.8 (8/21)   Last TSH 2/42 Thromboembolic risk factors ( age  -2, HTN-1, DM-1) for a CHADSVASc Score of >=4   Past Medical History:  Diagnosis Date  . Arthritis   . Cancer ([place])   . History of prostate cancer   . Stroke [facility]) 07/2017    Past Surgical History:  Procedure Laterality Date  . LOOP RECORDER INSERTION N/A 07/11/2017   Procedure: LOOP RECORDER INSERTION;  Surgeon: Constance Haw, MD;  Location: [place] [facility];  Service: Cardiovascular;  Laterality: N/A;  . NO PAST SURGERIES    . TEE WITHOUT CARDIOVERSION N/A 07/11/2017   Procedure: TRANSESOPHAGEAL ECHOCARDIOGRAM (TEE);  Surgeon: Acie Fredrickson Wonda Cheng, MD;  Location: [facility] ENDOSCOPY;  Service: Cardiovascular;  Laterality: N/A;    Current Outpatient Medications  Medication Sig Dispense Refill  . amLODipine-benazepril (LOTREL) 10-20 MG capsule Take 1 capsule by mouth daily.     Marland Kitchen apixaban (ELIQUIS) 5 MG TABS tablet Take 1 tablet (5 mg total) by mouth 2 (two) times daily. Schedule MD follow up for refills 60 tablet 0  . atorvastatin (LIPITOR) 20 MG  tablet Take 1 tablet (20 mg total) by mouth daily at 6 PM. 90 tablet 0  . citalopram (CELEXA) 20 MG tablet Take 20 mg by mouth daily.     Marland Kitchen docusate sodium (COLACE) 100 MG capsule Take 200 mg by mouth 2 (two) times daily.    . Ferrous Sulfate (IRON) 325 (65 Fe) MG TABS Take 325 mg by mouth daily.     . hydrochlorothiazide (HYDRODIURIL) 25 MG tablet Take 25 mg by mouth daily.    . metFORMIN (GLUCOPHAGE) 500 MG tablet Take 1 tablet (500 mg) by mouth twice daily    . nitroGLYCERIN (NITROSTAT) 0.4 MG SL tablet Place 1 tablet (0.4 mg total) under the tongue every 5 (five) minutes as needed for chest pain (notify PCP if experiencing chest pain and need to take Nitroglycerin). 30 tablet 1   No current facility-administered medications for this visit.    No Known Allergies    Review of Systems negative except from HPI and PMH  Physical Exam BP 120/64 (BP Location: Left Arm, Patient Position: Sitting, Cuff Size: Normal)   Pulse 74   Ht 5\' 9"  (1.753 m)   Wt 142 lb (64.4 kg)   SpO2 97%   BMI 20.97 kg/m  Well developed and nourished in no acute distress HENT normal Neck supple with JVP-  Flat  Clear Regular rate and  rhythm,2/6 RUSB  murmurs or gallops Abd-soft with active BS No Clubbing cyanosis edema Skin-warm and dry A & Oriented  Grossly normal sensory and motor function  ECG sinus 74 19/08/39        Assessment and  Plan  Cryptogenic stroke  Loop recorder in place   AS/ AI   Moderate  Hyperlipidemia  Hypertension  Atrial fibrillation/flutter  SVT-intercurrent   Weight Loss -- unintentional  Crying spontaneous   No intercurrent atrial fibrillation or flutter  On Anticoagulation;  No bleeding issues   Intercurrent SVT longest about 1 min-- not clearly symptomatic so will continue him on current BP regime without AV nodal blockade  wieght loss>> check TSH  Crying will have him discuss with PCP   Have messaged his PCP

## 2020-07-12 NOTE — Telephone Encounter (Signed)
Called pt and made him aware that his Eliquis was approved by [employer].

## 2020-07-13 ENCOUNTER — Encounter: Payer: Self-pay | Admitting: Internal Medicine

## 2020-07-13 ENCOUNTER — Other Ambulatory Visit: Payer: Self-pay | Admitting: Internal Medicine

## 2020-07-13 ENCOUNTER — Other Ambulatory Visit: Payer: Self-pay

## 2020-07-13 ENCOUNTER — Ambulatory Visit (INDEPENDENT_AMBULATORY_CARE_PROVIDER_SITE_OTHER): Payer: Medicare Other | Admitting: Internal Medicine

## 2020-07-13 VITALS — BP 120/64 | HR 74 | Ht 69.0 in | Wt 142.0 lb

## 2020-07-13 DIAGNOSIS — R634 Abnormal weight loss: Secondary | ICD-10-CM

## 2020-07-13 DIAGNOSIS — Z959 Presence of cardiac and vascular implant and graft, unspecified: Secondary | ICD-10-CM | POA: Diagnosis not present

## 2020-07-13 DIAGNOSIS — I639 Cerebral infarction, unspecified: Secondary | ICD-10-CM | POA: Diagnosis not present

## 2020-07-13 DIAGNOSIS — I480 Paroxysmal atrial fibrillation: Secondary | ICD-10-CM

## 2020-07-13 DIAGNOSIS — R000 Tachycardia, unspecified: Secondary | ICD-10-CM | POA: Diagnosis not present

## 2020-07-13 LAB — CUP PACEART INCLINIC DEVICE CHECK
Date Time Interrogation Session: 20220111083824
Implantable Pulse Generator Implant Date: 20190110

## 2020-07-13 NOTE — Telephone Encounter (Addendum)
Prescription refill request for Eliquis received.  Indication: afib Last office visit: Jeffrey Rojas 07/13/2020 Scr: 1.0, 05/06/2020 Age: 86 yo  Weight: 68.9 kg   Prescription refill sent.

## 2020-07-13 NOTE — Patient Instructions (Addendum)

## 2020-07-14 LAB — TSH: TSH: 2.38 u[IU]/mL (ref 0.450–4.500)

## 2020-07-21 ENCOUNTER — Telehealth: Payer: Self-pay | Admitting: Internal Medicine

## 2020-07-21 NOTE — Telephone Encounter (Signed)
The patient's caregiver, Barbaraann Share has been notified of the result and verbalized understanding (ok per DPR).  All questions (if any) were answered.

## 2020-07-21 NOTE — Telephone Encounter (Signed)
Jeffrey Sprang, MD  07/21/2020 11:57 AM EST Back to Top     Please Inform Patient that labs are normal  Thanks

## 2020-07-21 NOTE — Telephone Encounter (Signed)
Attempted to call the patient's caregiver, Janece Canterbury (ok per [facility]) with the patient's lab results. No answer- I left a message to please call back.

## 2020-07-23 ENCOUNTER — Ambulatory Visit (INDEPENDENT_AMBULATORY_CARE_PROVIDER_SITE_OTHER): Payer: Medicare Other

## 2020-07-23 DIAGNOSIS — I6300 Cerebral infarction due to thrombosis of unspecified precerebral artery: Secondary | ICD-10-CM | POA: Diagnosis not present

## 2020-07-23 LAB — CUP PACEART REMOTE DEVICE CHECK
Date Time Interrogation Session: 20220121013734
Implantable Pulse Generator Implant Date: 20190110

## 2020-07-29 ENCOUNTER — Encounter: Payer: Self-pay | Admitting: Internal Medicine

## 2020-08-04 NOTE — Progress Notes (Signed)
Carelink Summary Report / Loop Recorder 

## 2020-08-23 ENCOUNTER — Ambulatory Visit (INDEPENDENT_AMBULATORY_CARE_PROVIDER_SITE_OTHER): Payer: Medicare Other

## 2020-08-23 DIAGNOSIS — I6300 Cerebral infarction due to thrombosis of unspecified precerebral artery: Secondary | ICD-10-CM | POA: Diagnosis not present

## 2020-08-23 LAB — CUP PACEART REMOTE DEVICE CHECK
Date Time Interrogation Session: 20220221025705
Implantable Pulse Generator Implant Date: 20190110

## 2020-08-30 NOTE — Progress Notes (Signed)
Carelink Summary Report / Loop Recorder 

## 2020-09-23 ENCOUNTER — Ambulatory Visit (INDEPENDENT_AMBULATORY_CARE_PROVIDER_SITE_OTHER): Payer: Medicare Other

## 2020-09-23 DIAGNOSIS — I6300 Cerebral infarction due to thrombosis of unspecified precerebral artery: Secondary | ICD-10-CM

## 2020-09-23 LAB — CUP PACEART REMOTE DEVICE CHECK
Date Time Interrogation Session: 20220324035711
Implantable Pulse Generator Implant Date: 20190110

## 2020-10-05 NOTE — Progress Notes (Signed)
Carelink Summary Report / Loop Recorder 

## 2020-10-25 ENCOUNTER — Ambulatory Visit (INDEPENDENT_AMBULATORY_CARE_PROVIDER_SITE_OTHER): Payer: Medicare Other

## 2020-10-25 DIAGNOSIS — I6300 Cerebral infarction due to thrombosis of unspecified precerebral artery: Secondary | ICD-10-CM

## 2020-10-26 LAB — CUP PACEART REMOTE DEVICE CHECK
Date Time Interrogation Session: 20220424041057
Implantable Pulse Generator Implant Date: 20190110

## 2020-11-11 NOTE — Progress Notes (Signed)
Carelink Summary Report / Loop Recorder 

## 2020-11-25 ENCOUNTER — Ambulatory Visit (INDEPENDENT_AMBULATORY_CARE_PROVIDER_SITE_OTHER): Payer: Medicare Other

## 2020-11-25 DIAGNOSIS — I639 Cerebral infarction, unspecified: Secondary | ICD-10-CM

## 2020-11-25 LAB — CUP PACEART REMOTE DEVICE CHECK
Date Time Interrogation Session: 20220525041326
Implantable Pulse Generator Implant Date: 20190110

## 2020-12-20 NOTE — Progress Notes (Signed)
Carelink Summary Report / Loop Recorder 

## 2020-12-25 LAB — CUP PACEART REMOTE DEVICE CHECK
Date Time Interrogation Session: 20220625041545
Implantable Pulse Generator Implant Date: 20190110

## 2020-12-27 ENCOUNTER — Ambulatory Visit (INDEPENDENT_AMBULATORY_CARE_PROVIDER_SITE_OTHER): Payer: Medicare Other

## 2020-12-27 DIAGNOSIS — I6300 Cerebral infarction due to thrombosis of unspecified precerebral artery: Secondary | ICD-10-CM

## 2021-01-13 ENCOUNTER — Other Ambulatory Visit: Payer: Self-pay | Admitting: Internal Medicine

## 2021-01-13 DIAGNOSIS — I480 Paroxysmal atrial fibrillation: Secondary | ICD-10-CM

## 2021-01-13 NOTE — Progress Notes (Signed)
Carelink Summary Report / Loop Recorder 

## 2021-01-13 NOTE — Telephone Encounter (Signed)
Prescription refill request for Eliquis received. Indication: CVA Last office visit: 07/13/20  Olin Pia MD Scr: 0.8 on 11/01/20 Age: 86 Weight: 64.4kg  Based on above findings Eliquis 5mg  twice daily is the appropriate dose.  Refill approved.

## 2021-01-27 ENCOUNTER — Ambulatory Visit (INDEPENDENT_AMBULATORY_CARE_PROVIDER_SITE_OTHER): Payer: Medicare Other

## 2021-01-27 DIAGNOSIS — I639 Cerebral infarction, unspecified: Secondary | ICD-10-CM

## 2021-01-28 LAB — CUP PACEART REMOTE DEVICE CHECK
Date Time Interrogation Session: 20220726042529
Implantable Pulse Generator Implant Date: 20190110

## 2021-02-21 ENCOUNTER — Telehealth: Payer: Self-pay

## 2021-02-21 NOTE — Telephone Encounter (Signed)
ILR @ RRT 02/20/21. Attempted to contact patient to inform device @ RRT and discuss explant/leave in options.   No answer, LMOVM.   Marked "I" in [employer] Remotes canceled.

## 2021-02-23 ENCOUNTER — Telehealth: Payer: Self-pay

## 2021-02-23 NOTE — Addendum Note (Signed)
Addended by: Cheri Kearns A on: 02/23/2021 12:28 PM   Modules accepted: Level of Service

## 2021-02-23 NOTE — Progress Notes (Signed)
Carelink Summary Report / Loop Recorder 

## 2021-02-23 NOTE — Telephone Encounter (Signed)
Called patient to advise ILR has reached RRT and no longer records. Discussions options to explant or leave device in. Patient reports he would like to leave device in. Address on file verified. Will send return kit and patient advised to place back in mailbox to return to [employer].

## 2021-02-23 NOTE — Telephone Encounter (Signed)
I tried to explain to the patient girlfriend Barbaraann Share that the patient loop only last for 3 years. He no longer needs the monitor. I told her I will order a return kit for them to return the monitor. She wants to know if he is getting better or worse? Is it okay if they go out of town? Should he get another loop in? I told her those are questions for Dr. Caryl Comes. She keep repeating herself so I told her I will ask the nurse. I told her the nurse will give her a call back. She states if she do not answer to leave a message on her voicemail . Louise phone number is [phone].

## 2021-02-23 NOTE — Telephone Encounter (Signed)
Attempted to contact Louise for follow up questions. No answer, LMTCB.

## 2021-02-24 NOTE — Telephone Encounter (Signed)
Louise left a voicemail returning nurse call again.

## 2021-02-24 NOTE — Telephone Encounter (Signed)
Attempted to return phone call to pt girlfiend Louise (DPR on file)  no answer, LVM with device clinic # and hours to return call.

## 2021-02-24 NOTE — Telephone Encounter (Signed)
Pt girlfriend left a message returning nurse call. Her phone number is in previous note.

## 2021-02-25 NOTE — Telephone Encounter (Signed)
Spoke to Jeffrey Rojas and was able to clarify questions with him. Advised that he will not have a new device placed and we will send a return kit. Thanked me for call.

## 2021-05-06 ENCOUNTER — Emergency Department
Admission: EM | Admit: 2021-05-06 | Discharge: 2021-05-06 | Disposition: A | Discharge status: Home / Self Care | Payer: Medicare Other | Attending: Emergency Medicine | Admitting: Emergency Medicine

## 2021-05-06 ENCOUNTER — Other Ambulatory Visit: Payer: Self-pay

## 2021-05-06 DIAGNOSIS — W010XXA Fall on same level from slipping, tripping and stumbling without subsequent striking against object, initial encounter: Secondary | ICD-10-CM | POA: Diagnosis not present

## 2021-05-06 DIAGNOSIS — Z79899 Other long term (current) drug therapy: Secondary | ICD-10-CM | POA: Diagnosis not present

## 2021-05-06 DIAGNOSIS — Z8546 Personal history of malignant neoplasm of prostate: Secondary | ICD-10-CM | POA: Insufficient documentation

## 2021-05-06 DIAGNOSIS — S00501A Unspecified superficial injury of lip, initial encounter: Secondary | ICD-10-CM | POA: Diagnosis present

## 2021-05-06 DIAGNOSIS — S01511A Laceration without foreign body of lip, initial encounter: Secondary | ICD-10-CM | POA: Diagnosis not present

## 2021-05-06 DIAGNOSIS — Y9289 Other specified places as the place of occurrence of the external cause: Secondary | ICD-10-CM | POA: Insufficient documentation

## 2021-05-06 DIAGNOSIS — Z7901 Long term (current) use of anticoagulants: Secondary | ICD-10-CM | POA: Diagnosis not present

## 2021-05-06 DIAGNOSIS — I480 Paroxysmal atrial fibrillation: Secondary | ICD-10-CM | POA: Insufficient documentation

## 2021-05-06 DIAGNOSIS — I10 Essential (primary) hypertension: Secondary | ICD-10-CM | POA: Diagnosis not present

## 2021-05-06 MED ORDER — LIDOCAINE HCL (PF) 1 % IJ SOLN
5.0000 mL | Freq: Once | INTRAMUSCULAR | Status: DC
  Filled 2021-05-06: qty 5

## 2021-05-06 NOTE — ED Provider Notes (Addendum)
[facility] Emergency Department Provider Note ____________________________________________  Time seen: 1654  I have reviewed the triage vital signs and the nursing notes.  HISTORY  Chief Complaint  Laceration   HPI Jeffrey Rojas is a 86 y.o. male presents to the ED for evaluation of a mechanical fall.  He presents with a small laceration to the upper outer corner of the lip on the right side.  He denies any head injury or LOC.  He also denies any dental injury or nosebleed.  Past Medical History:  Diagnosis Date  . Arthritis   . Cancer ([place])   . History of prostate cancer   . Stroke [facility]) 07/2017    Patient Active Problem List   Diagnosis Date Noted  . PAF (paroxysmal atrial fibrillation) ([place]) 12/07/2017  . Atypical chest pain   . 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 HLD (hyperlipidemia) 11/17/2015  . H/O malignant neoplasm of prostate 11/15/2015  . BP (high blood pressure) 05/01/2014    Past Surgical History:  Procedure Laterality Date  . LOOP RECORDER INSERTION N/A 07/11/2017   Procedure: LOOP RECORDER INSERTION;  Surgeon: Constance Haw, MD;  Location: [place] [facility];  Service: Cardiovascular;  Laterality: N/A;  . NO PAST SURGERIES    . TEE WITHOUT CARDIOVERSION N/A 07/11/2017   Procedure: TRANSESOPHAGEAL ECHOCARDIOGRAM (TEE);  Surgeon: Acie Fredrickson Wonda Cheng, MD;  Location: [facility] ENDOSCOPY;  Service: Cardiovascular;  Laterality: N/A;    Prior to Admission medications   Medication Sig Start Date End Date Taking? Authorizing Provider  amLODipine-benazepril (LOTREL) 10-20 MG capsule Take 1 capsule by mouth daily.     [provider]  atorvastatin (LIPITOR) 20 MG tablet Take 1 tablet (20 mg total) by mouth daily at 6 PM. 09/12/17 09/12/18  Frann Rider, NP  citalopram (CELEXA) 20 MG tablet Take 20 mg by mouth daily.  08/30/17 07/13/20  [provider]  docusate sodium (COLACE) 100 MG capsule Take 200 mg by mouth 2 (two) times daily.     [provider]  ELIQUIS 5 MG TABS tablet TAKE 1 TABLET BY MOUTH TWICE A DAY 01/13/21   Skeet Latch, MD  Ferrous Sulfate (IRON) 325 (65 Fe) MG TABS Take 325 mg by mouth daily.     [provider]  hydrochlorothiazide (HYDRODIURIL) 25 MG tablet Take 25 mg by mouth daily.    [provider]  metFORMIN (GLUCOPHAGE) 500 MG tablet Take 1 tablet (500 mg) by mouth twice daily    [provider]  nitroGLYCERIN (NITROSTAT) 0.4 MG SL tablet Place 1 tablet (0.4 mg total) under the tongue every 5 (five) minutes as needed for chest pain (notify PCP if experiencing chest pain and need to take Nitroglycerin). 07/11/17   Mary Sella, NP    Allergies Patient has no known allergies.  Family History  Problem Relation Age of Onset  . Prostate cancer Neg Hx   . Kidney cancer Neg Hx     Social History Social History   Tobacco Use  . Smoking status: Never  . Smokeless tobacco: Never  Vaping Use  . Vaping Use: Never used  Substance Use Topics  . Alcohol use: Yes    Alcohol/week: 0.0 standard drinks    Comment: ocassionally  . Drug use: No    Review of Systems  Constitutional: Negative for fever. Eyes: Negative for visual changes. ENT: Negative for sore throat. Respiratory: Negative for shortness of breath. Gastrointestinal: Negative for abdominal pain, vomiting and diarrhea. Genitourinary: Negative for dysuria. Musculoskeletal:  Negative for back pain. Skin: Negative for rash.  Lip lac as above. Neurological: Negative for headaches, focal weakness or numbness. ____________________________________________  PHYSICAL EXAM:  VITAL SIGNS: ED Triage Vitals [05/06/21 1356]  Enc Vitals Group     BP 128/68     Pulse Rate 81     Resp 18     Temp 97.8 F (36.6 C)     Temp Source Oral     SpO2 98 %     Weight 140 lb (63.5 kg)     Height 5\' 9"  (1.753 m)     Head Circumference      Peak Flow      Pain Score 0     Pain Loc      Pain Edu?       Excl. in [place]?     Constitutional: Alert and oriented. Well appearing and in no distress. Head: Normocephalic and atraumatic. Eyes: Conjunctivae are normal. Normal extraocular movements Nose: No congestion/rhinorrhea/epistaxis. Mouth/Throat: Mucous membranes are moist.  Upper lip with a linear laceration to the right lateral and that does not extend across the vermilion border.  No dental injuries appreciated.  No active bleeding is noted. Cardiovascular: Normal rate, regular rhythm. Normal distal pulses. Respiratory: Normal respiratory effort. No wheezes/rales/rhonchi. Gastrointestinal: Soft and nontender. No distention. Musculoskeletal: Nontender with normal range of motion in all extremities.  Neurologic:  Normal gait without ataxia. Normal speech and language. No gross focal neurologic deficits are appreciated. Skin:  Skin is warm, dry and intact. No rash noted. Psychiatric: Mood and affect are normal. Patient exhibits appropriate insight and judgment. ____________________________________________    {LABS (pertinent positives/negatives)  ____________________________________________  {EKG  ____________________________________________   RADIOLOGY Official radiology report(s): No results found. ____________________________________________  PROCEDURES   .Marland KitchenLaceration Repair  Date/Time: 05/06/2021 5:05 PM Performed by: Melvenia Needles, PA-C Authorized by: Melvenia Needles, PA-C   Consent:    Consent obtained:  Verbal   Consent given by:  Patient   Risks, benefits, and alternatives were discussed: yes     Risks discussed:  Pain and poor cosmetic result   Alternatives discussed:  No treatment Universal protocol:    Patient identity confirmed:  Verbally with patient Anesthesia:    Anesthesia method:  Local infiltration   Local anesthetic:  Lidocaine 1% w/o epi Laceration details:    Location:  Lip   Lip location:  Upper exterior lip   Length (cm):  1    Depth (mm):  3 Pre-procedure details:    Preparation:  Patient was prepped and draped in usual sterile fashion Exploration:    Limited defect created (wound extended): no     Contaminated: no   Treatment:    Area cleansed with:  Saline   Amount of cleaning:  Standard   Irrigation solution:  Sterile saline   Irrigation volume:  10   Irrigation method:  Syringe   Visualized foreign bodies/material removed: no     Debridement:  None   Undermining:  None   Scar revision: no   Skin repair:    Repair method:  Sutures   Suture size:  5-0   Suture material:  Nylon   Suture technique:  Simple interrupted   Number of sutures:  3 Approximation:    Approximation:  Close   V[place] border well-aligned: yes   Repair type:    Repair type:  Simple Post-procedure details:    Dressing:  Open (no dressing)   Procedure completion:  Tolerated well, no immediate  complications ____________________________________________   INITIAL IMPRESSION / ASSESSMENT AND PLAN / ED COURSE  As part of my medical decision making, I reviewed the following data within the electronic MEDICAL RECORD NUMBER Notes from prior ED visits  Geriatric patient ED evaluation of mechanical fall resulting in a superficial laceration to the upper lip.  Patient presents with no other complaints no dental injuries, no head injury.  His sutures repaired with nylon sutures with good wound edge approximation.  The vermilion border which is not affected is well aligned.  He will see his PCP in 3 to 5 days for suture removal.  Carole Deere was evaluated in Emergency Department on 05/06/2021 for the symptoms described in the history of present illness. He was evaluated in the context of the global COVID-19 pandemic, which necessitated consideration that the patient might be at risk for infection with the SARS-CoV-2 virus that causes COVID-19. Institutional protocols and algorithms that pertain to the evaluation of patients at risk for COVID-19  are in a state of rapid change based on information released by regulatory bodies including the CDC and federal and state organizations. These policies and algorithms were followed during the patient's care in the ED. ____________________________________________  FINAL CLINICAL IMPRESSION(S) / ED DIAGNOSES  Final diagnoses:  Lip laceration, initial encounter      Melvenia Needles, PA-C 05/06/21 [address], Dannielle Karvonen, PA-C 05/06/21 1739    Nance Pear, MD 05/06/21 1755

## 2021-05-06 NOTE — ED Provider Notes (Signed)
Emergency Medicine Provider Triage Evaluation Note  Jeffrey Rojas , a 86 y.o. male  was evaluated in triage.  Pt complains of laceration to corner of mouth on the right side. He lost balance while going into his house and hit something. Denies dental injury or jaw pain. Denies loss of consciousness..  Review of Systems  Positive: Laceration Negative: Headache, loss of consciousness  Physical Exam  There were no vitals taken for this visit. Gen:   Awake, no distress   Resp:  Normal effort  MSK:   Moves extremities without difficulty  Other:    Medical Decision Making  Medically screening exam initiated at 1:54 PM.  Appropriate orders placed.  Jeffrey Rojas was informed that the remainder of the evaluation will be completed by another provider, this initial triage assessment does not replace that evaluation, and the importance of remaining in the ED until their evaluation is complete.   Victorino Dike, FNP 05/06/21 1357    Arta Silence, MD 05/06/21 1948

## 2021-05-06 NOTE — ED Triage Notes (Signed)
Pt here with c/o getting off balance and falling about an hour ago, lac to corner of upper lip noted, bleeding controlled at this time, states "I guess I tripped and hit my lip on something, denies LOC. Denies pain, NAD.

## 2021-05-06 NOTE — ED Notes (Signed)
Pt states he fell and doesn't know how he fell. He reports that "he fell out of nowhere" and didn't trip over anything. Laceration on right side of upper lip that is not bleeding at this time

## 2021-05-06 NOTE — Discharge Instructions (Signed)
See your provider for suture removal in 3 to 5 days.

## 2021-06-15 ENCOUNTER — Emergency Department: Payer: Medicare Other

## 2021-06-15 ENCOUNTER — Emergency Department
Admission: EM | Admit: 2021-06-15 | Discharge: 2021-06-15 | Disposition: A | Discharge status: Home / Self Care | Payer: Medicare Other | Attending: Emergency Medicine | Admitting: Emergency Medicine

## 2021-06-15 ENCOUNTER — Other Ambulatory Visit: Payer: Self-pay

## 2021-06-15 DIAGNOSIS — R296 Repeated falls: Secondary | ICD-10-CM | POA: Diagnosis not present

## 2021-06-15 DIAGNOSIS — R4182 Altered mental status, unspecified: Secondary | ICD-10-CM | POA: Insufficient documentation

## 2021-06-15 DIAGNOSIS — I10 Essential (primary) hypertension: Secondary | ICD-10-CM | POA: Diagnosis not present

## 2021-06-15 DIAGNOSIS — Z79899 Other long term (current) drug therapy: Secondary | ICD-10-CM | POA: Diagnosis not present

## 2021-06-15 DIAGNOSIS — Z8546 Personal history of malignant neoplasm of prostate: Secondary | ICD-10-CM | POA: Insufficient documentation

## 2021-06-15 DIAGNOSIS — M542 Cervicalgia: Secondary | ICD-10-CM | POA: Insufficient documentation

## 2021-06-15 DIAGNOSIS — R519 Headache, unspecified: Secondary | ICD-10-CM | POA: Insufficient documentation

## 2021-06-15 DIAGNOSIS — W19XXXA Unspecified fall, initial encounter: Secondary | ICD-10-CM | POA: Insufficient documentation

## 2021-06-15 DIAGNOSIS — Z20822 Contact with and (suspected) exposure to covid-19: Secondary | ICD-10-CM | POA: Insufficient documentation

## 2021-06-15 DIAGNOSIS — Z7984 Long term (current) use of oral hypoglycemic drugs: Secondary | ICD-10-CM | POA: Diagnosis not present

## 2021-06-15 DIAGNOSIS — G8911 Acute pain due to trauma: Secondary | ICD-10-CM | POA: Diagnosis not present

## 2021-06-15 DIAGNOSIS — R531 Weakness: Secondary | ICD-10-CM | POA: Insufficient documentation

## 2021-06-15 LAB — URINALYSIS, ROUTINE W REFLEX MICROSCOPIC
Bilirubin Urine: NEGATIVE
Glucose, UA: NEGATIVE mg/dL
Ketones, ur: NEGATIVE mg/dL
Nitrite: NEGATIVE
Protein, ur: NEGATIVE mg/dL
Specific Gravity, Urine: 1.015 (ref 1.005–1.030)
pH: 7.5 (ref 5.0–8.0)

## 2021-06-15 LAB — URINALYSIS, MICROSCOPIC (REFLEX)

## 2021-06-15 LAB — RESP PANEL BY RT-PCR (FLU A&B, COVID) ARPGX2
Influenza A by PCR: NEGATIVE
Influenza B by PCR: NEGATIVE
SARS Coronavirus 2 by RT PCR: NEGATIVE

## 2021-06-15 LAB — COMPREHENSIVE METABOLIC PANEL
ALT: 27 U/L (ref 0–44)
AST: 22 U/L (ref 15–41)
Albumin: 3.9 g/dL (ref 3.5–5.0)
Alkaline Phosphatase: 117 U/L (ref 38–126)
Anion gap: 5 (ref 5–15)
BUN: 15 mg/dL (ref 8–23)
CO2: 31 mmol/L (ref 22–32)
Calcium: 9.3 mg/dL (ref 8.9–10.3)
Chloride: 102 mmol/L (ref 98–111)
Creatinine, Ser: 1 mg/dL (ref 0.61–1.24)
GFR, Estimated: >60 mL/min (ref >60)
Glucose, Bld: 205 mg/dL — ABNORMAL HIGH (ref 70–99)
Potassium: 3.5 mmol/L (ref 3.5–5.1)
Sodium: 138 mmol/L (ref 135–145)
Total Bilirubin: 0.6 mg/dL (ref 0.3–1.2)
Total Protein: 7.9 g/dL (ref 6.5–8.1)

## 2021-06-15 LAB — CBC
HCT: 36.6 % — ABNORMAL LOW (ref 39.0–52.0)
Hemoglobin: 11.6 g/dL — ABNORMAL LOW (ref 13.0–17.0)
MCH: 27.4 pg (ref 26.0–34.0)
MCHC: 31.7 g/dL (ref 30.0–36.0)
MCV: 86.3 fL (ref 80.0–100.0)
Platelets: 280 10*3/uL (ref 150–400)
RBC: 4.24 MIL/uL (ref 4.22–5.81)
RDW: 15.1 % (ref 11.5–15.5)
WBC: 7.7 10*3/uL (ref 4.0–10.5)
nRBC: 0 % (ref 0.0–0.2)

## 2021-06-15 MED ORDER — SODIUM CHLORIDE 0.9 % IV BOLUS
1000.0000 mL | Freq: Once | INTRAVENOUS | Status: AC
  Administered 2021-06-15: 19:00:00 1000 mL via INTRAVENOUS

## 2021-06-15 NOTE — ED Triage Notes (Signed)
Pt comes into the ED via EMS from home, live alone , has home health, having multiple falls, neck pain, on eliquis, denies LOC, having issues with several months. Family states he just seems off.   135/76 [id number] 97.8 80HR 99%RA

## 2021-06-15 NOTE — Discharge Instructions (Signed)
Your test today are all unremarkable.  This includes a CT scan of your head and neck, chest x-ray, COVID and flu test, blood test, and urine test.  Please follow-up with your primary care doctor for continued evaluation of your symptoms.

## 2021-06-15 NOTE — ED Provider Notes (Signed)
[facility] Emergency Department Provider Note  ____________________________________________  Time seen: Approximately 7:35 PM  I have reviewed the triage vital signs and the nursing notes.   HISTORY  Chief Complaint Altered Mental Status and Weakness    HPI Jeffrey Rojas is a 86 y.o. male with a past history of stroke, atrial fibrillation, hyperlipidemia who comes the ED due to multiple falls.  He did hit his head and complains of mild neck pain.  He is on Eliquis.  Reports that this is been going on for several months.  Family is reported to EMS that patient's mental acuity seems diminished.  Patient denies any chest pain or shortness of breath, reports eating and drinking normally.  Denies fever vomiting diarrhea.    Past Medical History:  Diagnosis Date   Arthritis    Cancer [facility])    History of prostate cancer    Stroke ([place]) 07/2017     Patient Active Problem List   Diagnosis Date Noted   PAF (paroxysmal atrial fibrillation) ([place]) 12/07/2017   Atypical chest pain    Stroke (cerebrum) ([place]) 07/09/2017   HLD (hyperlipidemia) 11/17/2015   H/O malignant neoplasm of prostate 11/15/2015   BP (high blood pressure) 05/01/2014     Past Surgical History:  Procedure Laterality Date   LOOP RECORDER INSERTION N/A 07/11/2017   Procedure: LOOP RECORDER INSERTION;  Surgeon: Constance Haw, MD;  Location: [place] [facility] LAB;  Service: Cardiovascular;  Laterality: N/A;   NO PAST SURGERIES     TEE WITHOUT CARDIOVERSION N/A 07/11/2017   Procedure: TRANSESOPHAGEAL ECHOCARDIOGRAM (TEE);  Surgeon: Acie Fredrickson Wonda Cheng, MD;  Location: [facility] ENDOSCOPY;  Service: Cardiovascular;  Laterality: N/A;     Prior to Admission medications   Medication Sig Start Date End Date Taking? Authorizing Provider  busPIRone (BUSPAR) 5 MG tablet Take 1 tablet by mouth 2 (two) times daily. 08/10/20 08/10/21 Yes [provider]  metFORMIN (GLUCOPHAGE) 1000 MG tablet Take 1 tablet  by mouth 2 (two) times daily with a meal. 10/07/20  Yes [provider]  amLODipine-benazepril (LOTREL) 10-20 MG capsule Take 1 capsule by mouth daily.     [provider]  atorvastatin (LIPITOR) 20 MG tablet Take 1 tablet (20 mg total) by mouth daily at 6 PM. 09/12/17 09/12/18  Frann Rider, NP  citalopram (CELEXA) 20 MG tablet Take 20 mg by mouth daily.  08/30/17 07/13/20  [provider]  docusate sodium (COLACE) 100 MG capsule Take 200 mg by mouth 2 (two) times daily.    [provider]  ELIQUIS 5 MG TABS tablet TAKE 1 TABLET BY MOUTH TWICE A DAY 01/13/21   Skeet Latch, MD  Ferrous Sulfate (IRON) 325 (65 Fe) MG TABS Take 325 mg by mouth daily.     [provider]  fluticasone (FLONASE) 50 MCG/ACT nasal spray Place 1 spray into both nostrils daily. 06/02/21   [provider]  hydrochlorothiazide (HYDRODIURIL) 25 MG tablet Take 25 mg by mouth daily.    [provider]  levocetirizine (XYZAL) 5 MG tablet Take 1 tablet by mouth at bedtime. 06/02/21   [provider]  metFORMIN (GLUCOPHAGE) 500 MG tablet Take 1 tablet (500 mg) by mouth twice daily    [provider]  nitroGLYCERIN (NITROSTAT) 0.4 MG SL tablet Place 1 tablet (0.4 mg total) under the tongue every 5 (five) minutes as needed for chest pain (notify PCP if experiencing chest pain and need to take Nitroglycerin). 07/11/17   Mary Sella, NP  venlafaxine XR (EFFEXOR-XR) 150 MG 24 hr capsule Take 150 mg by mouth daily. 04/20/21   [provider]     Allergies Patient has no known allergies.   Family History  Problem Relation Age of Onset   Prostate cancer Neg Hx    Kidney cancer Neg Hx     Social History Social History   Tobacco Use   Smoking status: Never   Smokeless tobacco: Never  Vaping Use   Vaping Use: Never used  Substance Use Topics   Alcohol use: Yes    Alcohol/week: 0.0 standard drinks    Comment: ocassionally   Drug use:  No    Review of Systems  Constitutional:   No fever or chills.  ENT:   No sore throat. No rhinorrhea. Cardiovascular:   No chest pain or syncope. Respiratory:   No dyspnea positive cough. Gastrointestinal:   Negative for abdominal pain, vomiting and diarrhea.  Musculoskeletal: Positive neck pain All other systems reviewed and are negative except as documented above in ROS and HPI.  ____________________________________________   PHYSICAL EXAM:  VITAL SIGNS: ED Triage Vitals  Enc Vitals Group     BP 06/15/21 1325 128/71     Pulse Rate 06/15/21 1325 75     Resp 06/15/21 1325 17     Temp 06/15/21 1325 98 F (36.7 C)     Temp Source 06/15/21 1325 Oral     SpO2 06/15/21 1325 97 %     Weight 06/15/21 1755 141 lb (64 kg)     Height 06/15/21 1755 5\' 9"  (1.753 m)     Head Circumference --      Peak Flow --      Pain Score 06/15/21 1755 5     Pain Loc --      Pain Edu? --      Excl. in [place]? --     Vital signs reviewed, nursing assessments reviewed.   Constitutional:   Alert and oriented. Non-toxic appearance. Eyes:   Conjunctivae are normal. EOMI. PERRL. ENT      Head:   Normocephalic and atraumatic.      Nose: Normal, no epistaxis or septal hematoma, no rhinorrhea      Mouth/Throat:   Somewhat dry mucous membranes.  Otherwise normal      Neck:   No meningismus. Full ROM. Hematological/Lymphatic/Immunilogical:   No cervical lymphadenopathy. Cardiovascular:   RRR. Symmetric bilateral radial and DP pulses.  No murmurs. Cap refill less than 2 seconds. Respiratory:   Normal respiratory effort without tachypnea/retractions. Breath sounds are clear and equal bilaterally. No wheezes/rales/rhonchi. Gastrointestinal:   Soft and nontender. Non distended. There is no CVA tenderness.  No rebound, rigidity, or guarding. Genitourinary:   deferred Musculoskeletal:   Normal range of motion in all extremities. No joint effusions.  No lower extremity tenderness.  No edema. Neurologic:    Normal speech and language.  Motor grossly intact. No acute focal neurologic deficits are appreciated.  Skin:    Skin is warm, dry and intact. No rash noted.  No petechiae, purpura, or bullae.  ____________________________________________    LABS (pertinent positives/negatives) (all labs ordered are listed, but only abnormal results are displayed) Labs Reviewed  COMPREHENSIVE METABOLIC PANEL - Abnormal; Notable for the following components:      Result Value   Glucose, Bld 205 (*)    All other components within normal limits  CBC - Abnormal; Notable for the following components:   Hemoglobin 11.6 (*)    HCT 36.6 (*)  All other components within normal limits  URINALYSIS, ROUTINE W REFLEX MICROSCOPIC - Abnormal; Notable for the following components:   Hgb urine dipstick TRACE (*)    Leukocytes,Ua SMALL (*)    All other components within normal limits  URINALYSIS, MICROSCOPIC (REFLEX) - Abnormal; Notable for the following components:   Bacteria, UA RARE (*)    All other components within normal limits  RESP PANEL BY RT-PCR (FLU A&B, COVID) ARPGX2  CBG MONITORING, ED   ____________________________________________   EKG  Interpreted by me Normal sinus rhythm rate of 73, normal axis and intervals.  Poor R wave progression.  Normal ST segments and T waves.  ____________________________________________    RADIOLOGY  CT Head Wo Contrast  Result Date: 06/15/2021 CLINICAL DATA:  Multiple falls, head trauma, neck pain, denies loss of consciousness, family states patient "seems off" EXAM: CT HEAD WITHOUT CONTRAST CT CERVICAL SPINE WITHOUT CONTRAST TECHNIQUE: Multidetector CT imaging of the head and cervical spine was performed following the standard protocol without intravenous contrast. Multiplanar CT image reconstructions of the cervical spine were also generated. COMPARISON:  CT head 07/09/2017 FINDINGS: CT HEAD FINDINGS Brain: Generalized atrophy. Normal ventricular  morphology. No midline shift or mass effect. Encephalomalacia at LEFT occipital lobe from remote infarct, though new since prior exam. Minor small vessel chronic ischemic changes of deep cerebral white matter. No intracranial hemorrhage, mass lesion, or evidence of acute infarction. Question small RIGHT frontoparietal chronic subdural hygroma, new. Vascular: No hyperdense vessels. Atherosclerotic calcification of is of internal carotid arteries at skull base Skull: Intact Sinuses/Orbits: Clear Other: N/A CT CERVICAL SPINE FINDINGS Alignment: Normal Skull base and vertebrae: Multilevel disc space narrowing and endplate spur formation. Scattered mild facet degenerative changes. Scattered sclerosis and heterogeneity of osseous mineralization in the cervical spine. Vertebral body heights maintained without fracture or subluxation. Soft tissues and spinal canal: Prevertebral soft tissues normal thickness. Atherosclerotic calcifications at the carotid bifurcations greater on LEFT. Disc levels:  Scattered bulging discs and endplate spurs. Upper chest: Lung apices clear Other: N/A IMPRESSION: Atrophy with small vessel chronic ischemic changes of deep cerebral white matter. Old LEFT occipital lobe infarct, new since 2019; this corresponds to occlusion of the P1 and P2 segments of the LEFT posterior cerebral artery as identified on prior CT angiography in 2019. Question small RIGHT frontoparietal subdural hygroma new since 2019. No acute intracranial abnormalities. Multilevel degenerative disc and facet disease changes of the cervical spine. No acute cervical spine abnormalities. Electronically Signed   By: Lavonia Dana M.D.   On: 06/15/2021 14:29   CT Cervical Spine Wo Contrast  Result Date: 06/15/2021 CLINICAL DATA:  Multiple falls, head trauma, neck pain, denies loss of consciousness, family states patient "seems off" EXAM: CT HEAD WITHOUT CONTRAST CT CERVICAL SPINE WITHOUT CONTRAST TECHNIQUE: Multidetector CT imaging  of the head and cervical spine was performed following the standard protocol without intravenous contrast. Multiplanar CT image reconstructions of the cervical spine were also generated. COMPARISON:  CT head 07/09/2017 FINDINGS: CT HEAD FINDINGS Brain: Generalized atrophy. Normal ventricular morphology. No midline shift or mass effect. Encephalomalacia at LEFT occipital lobe from remote infarct, though new since prior exam. Minor small vessel chronic ischemic changes of deep cerebral white matter. No intracranial hemorrhage, mass lesion, or evidence of acute infarction. Question small RIGHT frontoparietal chronic subdural hygroma, new. Vascular: No hyperdense vessels. Atherosclerotic calcification of is of internal carotid arteries at skull base Skull: Intact Sinuses/Orbits: Clear Other: N/A CT CERVICAL SPINE FINDINGS Alignment: Normal Skull base and vertebrae: Multilevel  disc space narrowing and endplate spur formation. Scattered mild facet degenerative changes. Scattered sclerosis and heterogeneity of osseous mineralization in the cervical spine. Vertebral body heights maintained without fracture or subluxation. Soft tissues and spinal canal: Prevertebral soft tissues normal thickness. Atherosclerotic calcifications at the carotid bifurcations greater on LEFT. Disc levels:  Scattered bulging discs and endplate spurs. Upper chest: Lung apices clear Other: N/A IMPRESSION: Atrophy with small vessel chronic ischemic changes of deep cerebral white matter. Old LEFT occipital lobe infarct, new since 2019; this corresponds to occlusion of the P1 and P2 segments of the LEFT posterior cerebral artery as identified on prior CT angiography in 2019. Question small RIGHT frontoparietal subdural hygroma new since 2019. No acute intracranial abnormalities. Multilevel degenerative disc and facet disease changes of the cervical spine. No acute cervical spine abnormalities. Electronically Signed   By: Lavonia Dana M.D.   On:  06/15/2021 14:29   DG Chest Portable 1 View  Result Date: 06/15/2021 CLINICAL DATA:  Weakness short of breath EXAM: PORTABLE CHEST 1 VIEW COMPARISON:  07/09/2017 FINDINGS: Recording device over left chest. No focal opacity, pleural effusion or pneumothorax. Scarring or atelectasis left base. Aortic atherosclerosis. IMPRESSION: No active disease. Electronically Signed   By: Donavan Foil M.D.   On: 06/15/2021 18:40    ____________________________________________   PROCEDURES Procedures  ____________________________________________  DIFFERENTIAL DIAGNOSIS   Intracranial hemorrhage, C-spine fracture, pneumonia, viral illness, dehydration, electrolyte abnormality, UTI  CLINICAL IMPRESSION / ASSESSMENT AND PLAN / ED COURSE  Medications ordered in the ED: Medications  sodium chloride 0.9 % bolus 1,000 mL (0 mLs Intravenous Stopped 06/15/21 2000)    Pertinent labs & imaging results that were available during my care of the patient were reviewed by me and considered in my medical decision making (see chart for details).  Jeffrey Rojas was evaluated in Emergency Department on 06/15/2021 for the symptoms described in the history of present illness. He was evaluated in the context of the global COVID-19 pandemic, which necessitated consideration that the patient might be at risk for infection with the SARS-CoV-2 virus that causes COVID-19. Institutional protocols and algorithms that pertain to the evaluation of patients at risk for COVID-19 are in a state of rapid change based on information released by regulatory bodies including the CDC and federal and state organizations. These policies and algorithms were followed during the patient's care in the ED.   Patient presents with multiple falls at home.  CT scan of the head and neck are unremarkable.  Chest x-ray unremarkable.  Vital signs are normal.  Exam is nonfocal and overall reassuring.  Will check urinalysis, COVID/flu.  Will check ambulation  and orthostatics.  ----------------------------------------- 9:23 PM on 06/15/2021 ----------------------------------------- Orthostatics and ambulation okay.  COVID and flu negative, urinalysis unremarkable.  Stable for discharge home.  Recommend close follow-up with PCP      ____________________________________________   FINAL CLINICAL IMPRESSION(S) / ED DIAGNOSES    Final diagnoses:  Fall in home, initial encounter     ED Discharge Orders     None       Portions of this note were generated with dragon dictation software. Dictation errors may occur despite best attempts at proofreading.    Carrie Mew, MD 06/15/21 2123

## 2021-06-15 NOTE — ED Notes (Signed)
Sent rainbow to lab. 

## 2021-07-01 ENCOUNTER — Emergency Department
Admission: EM | Admit: 2021-07-01 | Discharge: 2021-07-01 | Disposition: A | Discharge status: Home / Self Care | Payer: Medicare Other | Attending: Emergency Medicine | Admitting: Emergency Medicine

## 2021-07-01 ENCOUNTER — Other Ambulatory Visit: Payer: Self-pay

## 2021-07-01 ENCOUNTER — Encounter: Payer: Self-pay | Admitting: Intensive Care

## 2021-07-01 ENCOUNTER — Emergency Department: Payer: Medicare Other

## 2021-07-01 DIAGNOSIS — R531 Weakness: Secondary | ICD-10-CM | POA: Diagnosis not present

## 2021-07-01 DIAGNOSIS — I480 Paroxysmal atrial fibrillation: Secondary | ICD-10-CM | POA: Insufficient documentation

## 2021-07-01 DIAGNOSIS — I10 Essential (primary) hypertension: Secondary | ICD-10-CM | POA: Diagnosis not present

## 2021-07-01 DIAGNOSIS — S199XXA Unspecified injury of neck, initial encounter: Secondary | ICD-10-CM | POA: Insufficient documentation

## 2021-07-01 DIAGNOSIS — Z8546 Personal history of malignant neoplasm of prostate: Secondary | ICD-10-CM | POA: Insufficient documentation

## 2021-07-01 DIAGNOSIS — R350 Frequency of micturition: Secondary | ICD-10-CM | POA: Diagnosis not present

## 2021-07-01 DIAGNOSIS — S0990XA Unspecified injury of head, initial encounter: Secondary | ICD-10-CM | POA: Insufficient documentation

## 2021-07-01 DIAGNOSIS — W19XXXA Unspecified fall, initial encounter: Secondary | ICD-10-CM | POA: Diagnosis not present

## 2021-07-01 DIAGNOSIS — Z79899 Other long term (current) drug therapy: Secondary | ICD-10-CM | POA: Insufficient documentation

## 2021-07-01 LAB — URINALYSIS, ROUTINE W REFLEX MICROSCOPIC
Bilirubin Urine: NEGATIVE
Glucose, UA: 50 mg/dL — AB
Hgb urine dipstick: NEGATIVE
Ketones, ur: NEGATIVE mg/dL
Leukocytes,Ua: NEGATIVE
Nitrite: NEGATIVE
Protein, ur: NEGATIVE mg/dL
Specific Gravity, Urine: 1.02 (ref 1.005–1.030)
pH: 5 (ref 5.0–8.0)

## 2021-07-01 LAB — CBC WITH DIFFERENTIAL/PLATELET
Abs Immature Granulocytes: 0.02 10*3/uL (ref 0.00–0.07)
Basophils Absolute: 0 10*3/uL (ref 0.0–0.1)
Basophils Relative: 0 %
Eosinophils Absolute: 0 10*3/uL (ref 0.0–0.5)
Eosinophils Relative: 0 %
HCT: 39.8 % (ref 39.0–52.0)
Hemoglobin: 12.5 g/dL — ABNORMAL LOW (ref 13.0–17.0)
Immature Granulocytes: 0 %
Lymphocytes Relative: 33 %
Lymphs Abs: 1.9 10*3/uL (ref 0.7–4.0)
MCH: 27 pg (ref 26.0–34.0)
MCHC: 31.4 g/dL (ref 30.0–36.0)
MCV: 86 fL (ref 80.0–100.0)
Monocytes Absolute: 0.6 10*3/uL (ref 0.1–1.0)
Monocytes Relative: 10 %
Neutro Abs: 3.2 10*3/uL (ref 1.7–7.7)
Neutrophils Relative %: 57 %
Platelets: 300 10*3/uL (ref 150–400)
RBC: 4.63 MIL/uL (ref 4.22–5.81)
RDW: 15 % (ref 11.5–15.5)
WBC: 5.7 10*3/uL (ref 4.0–10.5)
nRBC: 0 % (ref 0.0–0.2)

## 2021-07-01 LAB — COMPREHENSIVE METABOLIC PANEL
ALT: 27 U/L (ref 0–44)
AST: 23 U/L (ref 15–41)
Albumin: 3.7 g/dL (ref 3.5–5.0)
Alkaline Phosphatase: 134 U/L — ABNORMAL HIGH (ref 38–126)
Anion gap: 5 (ref 5–15)
BUN: 16 mg/dL (ref 8–23)
CO2: 32 mmol/L (ref 22–32)
Calcium: 9.3 mg/dL (ref 8.9–10.3)
Chloride: 99 mmol/L (ref 98–111)
Creatinine, Ser: 1.15 mg/dL (ref 0.61–1.24)
GFR, Estimated: >60 mL/min (ref >60)
Glucose, Bld: 226 mg/dL — ABNORMAL HIGH (ref 70–99)
Potassium: 4.4 mmol/L (ref 3.5–5.1)
Sodium: 136 mmol/L (ref 135–145)
Total Bilirubin: 0.9 mg/dL (ref 0.3–1.2)
Total Protein: 7.8 g/dL (ref 6.5–8.1)

## 2021-07-01 LAB — PROTIME-INR
INR: 1.2 (ref 0.8–1.2)
Prothrombin Time: 15.5 seconds — ABNORMAL HIGH (ref 11.4–15.2)

## 2021-07-01 LAB — TROPONIN I (HIGH SENSITIVITY): Troponin I (High Sensitivity): 6 ng/L (ref <18)

## 2021-07-01 NOTE — ED Triage Notes (Signed)
Patient brought to ER by brother. Patient reports he fell a few days ago but unsure what cause him to fall. Reports hitting head and being on blood thinners. Denies vision changes. Patients main complaint is weakness.

## 2021-07-01 NOTE — ED Provider Notes (Signed)
Emergency Medicine Provider Triage Evaluation Note  Jeffrey Rojas , a 86 y.o. male  was evaluated in triage.  Pt complains of fall a few days ago maybe due to weakness. He did hit his head, but doesn't believe he lost consciousness. Brother is here with him and has not noticed any change in behavior.  Review of Systems   Level 5 caveat: patient does not answer direct questions   Physical Exam  BP (!) 147/87 (BP Location: Left Arm)    Pulse 84    Temp 97.8 F (36.6 C) (Oral)    Resp 18    Ht 5\' 9"  (1.753 m)    Wt 64 kg    SpO2 97%    BMI 20.84 kg/m  Gen:   Awake, no distress   Resp:  Normal effort  MSK:   Moves extremities without difficulty  Other:    Medical Decision Making  Medically screening exam initiated at 2:22 PM.  Appropriate orders placed.  Jeffrey Rojas was informed that the remainder of the evaluation will be completed by another provider, this initial triage assessment does not replace that evaluation, and the importance of remaining in the ED until their evaluation is complete.  Patient and brother are very poor historians. Difficult to ascertain exactly why he is here.    Victorino Dike, FNP 07/01/21 1425    Jeffrey Dark, MD 07/01/21 1506

## 2021-07-01 NOTE — ED Provider Notes (Signed)
[facility] Emergency Department Provider Note ____________________________________________   Event Date/Time   First MD Initiated Contact with Patient 07/01/21 1609     (approximate)  I have reviewed the triage vital signs and the nursing notes.  HISTORY  Chief Complaint Weakness  HPI Jeffrey Rojas is a 86 y.o. male who presents after a fall and complaining of generalized weakness  LOCATION: Generalized DURATION: 1 week prior to arrival TIMING: Stable since onset SEVERITY: Mild QUALITY: Generalized weakness CONTEXT: Patient states that he was told to come to the emergency department today due to a alleged fall that he had had 3 days prior to arrival and the fact that he is taking anticoagulation concerning for having a delayed head bleed.  Patient states that he has only been feeling generalized weakness over the past week and has had some increased urinary frequency MODIFYING FACTORS: Denies any exacerbating or relieving factors ASSOCIATED SYMPTOMS: Urinary frequency   Per medical record review, patient has history of CVA, paroxysmal atrial fibrillation, HLD, and hypertension      Past Medical History:  Diagnosis Date   Arthritis    Cancer ([place])    History of prostate cancer    Stroke ([place]) 07/2017    Patient Active Problem List   Diagnosis Date Noted   PAF (paroxysmal atrial fibrillation) ([place]) 12/07/2017   Atypical chest pain    Stroke (cerebrum) ([place]) 07/09/2017   HLD (hyperlipidemia) 11/17/2015   H/O malignant neoplasm of prostate 11/15/2015   BP (high blood pressure) 05/01/2014    Past Surgical History:  Procedure Laterality Date   LOOP RECORDER INSERTION N/A 07/11/2017   Procedure: LOOP RECORDER INSERTION;  Surgeon: Constance Haw, MD;  Location: [place] [facility];  Service: Cardiovascular;  Laterality: N/A;   NO PAST SURGERIES     TEE WITHOUT CARDIOVERSION N/A 07/11/2017   Procedure: TRANSESOPHAGEAL ECHOCARDIOGRAM (TEE);   Surgeon: Acie Fredrickson Wonda Cheng, MD;  Location: [facility] ENDOSCOPY;  Service: Cardiovascular;  Laterality: N/A;    Prior to Admission medications   Medication Sig Start Date End Date Taking? Authorizing Provider  amLODipine-benazepril (LOTREL) 10-20 MG capsule Take 1 capsule by mouth daily.     [provider]  atorvastatin (LIPITOR) 20 MG tablet Take 1 tablet (20 mg total) by mouth daily at 6 PM. 09/12/17 09/12/18  Frann Rider, NP  busPIRone (BUSPAR) 5 MG tablet Take 1 tablet by mouth 2 (two) times daily. 08/10/20 08/10/21  [provider]  citalopram (CELEXA) 20 MG tablet Take 20 mg by mouth daily.  08/30/17 07/13/20  [provider]  docusate sodium (COLACE) 100 MG capsule Take 200 mg by mouth 2 (two) times daily.    [provider]  ELIQUIS 5 MG TABS tablet TAKE 1 TABLET BY MOUTH TWICE A DAY 01/13/21   Skeet Latch, MD  Ferrous Sulfate (IRON) 325 (65 Fe) MG TABS Take 325 mg by mouth daily.     [provider]  fluticasone (FLONASE) 50 MCG/ACT nasal spray Place 1 spray into both nostrils daily. 06/02/21   [provider]  hydrochlorothiazide (HYDRODIURIL) 25 MG tablet Take 25 mg by mouth daily.    [provider]  levocetirizine (XYZAL) 5 MG tablet Take 1 tablet by mouth at bedtime. 06/02/21   [provider]  metFORMIN (GLUCOPHAGE) 1000 MG tablet Take 1 tablet by mouth 2 (two) times daily with a meal. 10/07/20   [provider]  metFORMIN (GLUCOPHAGE) 500 MG tablet Take 1 tablet (500 mg) by mouth twice  daily    [provider]  nitroGLYCERIN (NITROSTAT) 0.4 MG SL tablet Place 1 tablet (0.4 mg total) under the tongue every 5 (five) minutes as needed for chest pain (notify PCP if experiencing chest pain and need to take Nitroglycerin). 07/11/17   Mary Sella, NP  venlafaxine XR (EFFEXOR-XR) 150 MG 24 hr capsule Take 150 mg by mouth daily. 04/20/21   [provider]    Allergies Patient has no known  allergies.  Family History  Problem Relation Age of Onset   Prostate cancer Neg Hx    Kidney cancer Neg Hx     Social History Social History   Tobacco Use   Smoking status: Never   Smokeless tobacco: Never  Vaping Use   Vaping Use: Never used  Substance Use Topics   Alcohol use: Never    Comment: ocassionally   Drug use: No    Review of Systems Constitutional: No fever/chills Eyes: No visual changes. ENT: No sore throat. Cardiovascular: Denies chest pain. Respiratory: Denies shortness of breath. Gastrointestinal: No abdominal pain.  No nausea, no vomiting.  No diarrhea. Genitourinary: Negative for dysuria. Musculoskeletal: Negative for acute arthralgias Skin: Negative for rash. Neurological: Positive for generalized weakness.  Negative for headaches, numbness/paresthesias in any extremity Psychiatric: Negative for suicidal ideation/homicidal ideation   ____________________________________________   PHYSICAL EXAM:  VITAL SIGNS: ED Triage Vitals [07/01/21 1416]  Enc Vitals Group     BP (!) 147/87     Pulse Rate 84     Resp 18     Temp 97.8 F (36.6 C)     Temp Source Oral     SpO2 97 %     Weight 141 lb 1.5 oz (64 kg)     Height 5\' 9"  (1.753 m)     Head Circumference      Peak Flow      Pain Score 0     Pain Loc      Pain Edu?      Excl. in [place]?    Constitutional: Alert and oriented. Well appearing and in no acute distress. Eyes: Conjunctivae are normal. PERRL. Head: Atraumatic. Nose: No congestion/rhinnorhea. Mouth/Throat: Mucous membranes are moist. Neck: No stridor Cardiovascular: Grossly normal heart sounds.  Good peripheral circulation. Respiratory: Normal respiratory effort.  No retractions. Gastrointestinal: Soft and nontender. No distention. Musculoskeletal: No obvious deformities Neurologic:  Normal speech and language. No gross focal neurologic deficits are appreciated. Skin:  Skin is warm and dry. No rash noted. Psychiatric: Mood and  affect are normal. Speech and behavior are normal.  ____________________________________________   LABS (all labs ordered are listed, but only abnormal results are displayed)  Labs Reviewed  COMPREHENSIVE METABOLIC PANEL - Abnormal; Notable for the following components:      Result Value   Glucose, Bld 226 (*)    Alkaline Phosphatase 134 (*)    All other components within normal limits  CBC WITH DIFFERENTIAL/PLATELET - Abnormal; Notable for the following components:   Hemoglobin 12.5 (*)    All other components within normal limits  URINALYSIS, ROUTINE W REFLEX MICROSCOPIC - Abnormal; Notable for the following components:   Color, Urine YELLOW (*)    APPearance CLEAR (*)    Glucose, UA 50 (*)    All other components within normal limits  PROTIME-INR - Abnormal; Notable for the following components:   Prothrombin Time 15.5 (*)    All other components within normal limits  TROPONIN I (HIGH SENSITIVITY)   ____________________________________________  EKG  ED ECG REPORT I, Naaman Plummer, the attending physician, personally viewed and interpreted this ECG.  Date: 07/01/2021 EKG Time: 1425 Rate: 83 Rhythm: normal sinus rhythm QRS Axis: normal Intervals: normal ST/T Wave abnormalities: normal Narrative Interpretation: no evidence of acute ischemia.  PVC  ____________________________________________  RADIOLOGY  ED MD interpretation: CT of the head without contrast shows no evidence of acute abnormalities including no intracerebral hemorrhage, obvious masses, or significant edema  CT of the cervical spine does not show any evidence of acute abnormalities including no acute fracture, malalignment, height loss, or dislocation  Official radiology report(s): CT Head Wo Contrast  Result Date: 07/01/2021 CLINICAL DATA:  Fall.  Neck trauma.  Head trauma. EXAM: CT HEAD WITHOUT CONTRAST CT CERVICAL SPINE WITHOUT CONTRAST TECHNIQUE: Multidetector CT imaging of the head and  cervical spine was performed following the standard protocol without intravenous contrast. Multiplanar CT image reconstructions of the cervical spine were also generated. COMPARISON:  06/15/2021 FINDINGS: CT HEAD FINDINGS Brain: Diffuse cerebral atrophy. Mild ventricular dilatation consistent with central atrophy. Low-attenuation changes in the deep white matter consistent small vessel ischemia. Encephalomalacia in the left occipital lobe consistent with old infarct, unchanged. Bilateral subdural fluid collections with low-attenuation consistent with chronic hygromas. These are demonstrating progression since the previous study. No acute intracranial hemorrhage. No mass effect or midline shift. Basal cisterns are not effaced. Gray-white matter junctions are distinct. Vascular: Moderate intracranial arterial vascular calcifications. Skull: Calvarium appears intact. Sinuses/Orbits: Paranasal sinuses and mastoid air cells are clear. Other: None. CT CERVICAL SPINE FINDINGS Alignment: Straightening of usual cervical lordosis without anterior subluxation. No change in alignment since prior study. Changes are likely positional or degenerative. Skull base and vertebrae: Skull base appears intact. No vertebral compression deformities. No focal bone lesion or bone destruction. Diffuse bone demineralization. Soft tissues and spinal canal: No prevertebral soft tissue swelling. No abnormal paraspinal soft tissue mass or infiltration. No significant cervical lymphadenopathy. Disc levels: Degenerative changes throughout with narrowed disc spaces and endplate osteophyte formation. Degenerative changes in the facet joints. Upper chest: Lung apices are clear. Other: None. IMPRESSION: 1. No acute intracranial abnormalities. 2. Chronic cerebral atrophy and small vessel ischemic changes. Old infarct in the left occipital lobe. 3. Bilateral chronic appearing subdural hygromas demonstrating progression since prior study. No acute  intracranial hemorrhage. 4. Nonspecific straightening of usual cervical lordosis, unchanged. No acute displaced cervical spine fractures identified. 5. Diffuse degenerative changes in the cervical spine. Electronically Signed   By: Lucienne Capers M.D.   On: 07/01/2021 15:37   CT Cervical Spine Wo Contrast  Result Date: 07/01/2021 CLINICAL DATA:  Fall.  Neck trauma.  Head trauma. EXAM: CT HEAD WITHOUT CONTRAST CT CERVICAL SPINE WITHOUT CONTRAST TECHNIQUE: Multidetector CT imaging of the head and cervical spine was performed following the standard protocol without intravenous contrast. Multiplanar CT image reconstructions of the cervical spine were also generated. COMPARISON:  06/15/2021 FINDINGS: CT HEAD FINDINGS Brain: Diffuse cerebral atrophy. Mild ventricular dilatation consistent with central atrophy. Low-attenuation changes in the deep white matter consistent small vessel ischemia. Encephalomalacia in the left occipital lobe consistent with old infarct, unchanged. Bilateral subdural fluid collections with low-attenuation consistent with chronic hygromas. These are demonstrating progression since the previous study. No acute intracranial hemorrhage. No mass effect or midline shift. Basal cisterns are not effaced. Gray-white matter junctions are distinct. Vascular: Moderate intracranial arterial vascular calcifications. Skull: Calvarium appears intact. Sinuses/Orbits: Paranasal sinuses and mastoid air cells are clear. Other: None. CT CERVICAL SPINE FINDINGS Alignment: Straightening of usual  cervical lordosis without anterior subluxation. No change in alignment since prior study. Changes are likely positional or degenerative. Skull base and vertebrae: Skull base appears intact. No vertebral compression deformities. No focal bone lesion or bone destruction. Diffuse bone demineralization. Soft tissues and spinal canal: No prevertebral soft tissue swelling. No abnormal paraspinal soft tissue mass or  infiltration. No significant cervical lymphadenopathy. Disc levels: Degenerative changes throughout with narrowed disc spaces and endplate osteophyte formation. Degenerative changes in the facet joints. Upper chest: Lung apices are clear. Other: None. IMPRESSION: 1. No acute intracranial abnormalities. 2. Chronic cerebral atrophy and small vessel ischemic changes. Old infarct in the left occipital lobe. 3. Bilateral chronic appearing subdural hygromas demonstrating progression since prior study. No acute intracranial hemorrhage. 4. Nonspecific straightening of usual cervical lordosis, unchanged. No acute displaced cervical spine fractures identified. 5. Diffuse degenerative changes in the cervical spine. Electronically Signed   By: Lucienne Capers M.D.   On: 07/01/2021 15:37    ____________________________________________   PROCEDURES  Procedure(s) performed (including Critical Care):  Procedures   ____________________________________________   INITIAL IMPRESSION / ASSESSMENT AND PLAN / ED COURSE  As part of my medical decision making, I reviewed the following data within the electronic medical record, if available:  Nursing notes reviewed and incorporated, Labs reviewed, EKG interpreted, Old chart reviewed, Radiograph reviewed and Notes from prior ED visits reviewed and incorporated      Presenting after a fall that occurred 3 days prior to arrival to arrival, resulting in injury to the head. The mechanism of injury was a mechanical ground level fall without syncope or near-syncope. The current level of pain is moderate. There was no loss of consciousness, confusion, seizure, or memory impairment. There is not a laceration associated with the injury. Denies neck pain. The patient does take blood thinner medications. Denies vomiting, numbness/weakness, fever CT of the head and C-spine did not show any evidence of acute abnormalities.  Patient's EKG not concerning for any ischemic changes  and only showing a PVC that is new from previous.  Patient has elevated glucose with history of type 2 diabetes and no anion gap or tachypnea suggesting DKA at this time.  Patient's UA shows no evidence of acute UTI and troponin negative x1 Dispo: Discharge with PCP follow-up    ____________________________________________ FINAL CLINICAL IMPRESSION(S) / ED DIAGNOSES  Final diagnoses:  Generalized weakness  Fall, initial encounter     ED Discharge Orders     None        Note:  This document was prepared using Dragon voice recognition software and may include unintentional dictation errors.    Naaman Plummer, MD 07/01/21 [phone]

## 2021-07-01 NOTE — ED Notes (Signed)
Pt here after fall few days ago.  States hit head and back.  No complaints of pain at this time.  States he is a little sore in his back.  No other complaints.  A&O x 4.

## 2021-08-09 ENCOUNTER — Other Ambulatory Visit: Payer: Self-pay

## 2021-08-09 ENCOUNTER — Emergency Department: Payer: Medicare Other

## 2021-08-09 ENCOUNTER — Emergency Department
Admission: EM | Admit: 2021-08-09 | Discharge: 2021-08-09 | Disposition: A | Discharge status: Home / Self Care | Payer: Medicare Other | Attending: Emergency Medicine | Admitting: Emergency Medicine

## 2021-08-09 DIAGNOSIS — S0003XA Contusion of scalp, initial encounter: Secondary | ICD-10-CM | POA: Diagnosis not present

## 2021-08-09 DIAGNOSIS — R42 Dizziness and giddiness: Secondary | ICD-10-CM | POA: Diagnosis not present

## 2021-08-09 DIAGNOSIS — Y9222 Religious institution as the place of occurrence of the external cause: Secondary | ICD-10-CM | POA: Insufficient documentation

## 2021-08-09 DIAGNOSIS — S0083XA Contusion of other part of head, initial encounter: Secondary | ICD-10-CM | POA: Insufficient documentation

## 2021-08-09 DIAGNOSIS — R55 Syncope and collapse: Secondary | ICD-10-CM | POA: Insufficient documentation

## 2021-08-09 DIAGNOSIS — W19XXXA Unspecified fall, initial encounter: Secondary | ICD-10-CM | POA: Diagnosis not present

## 2021-08-09 DIAGNOSIS — S0990XA Unspecified injury of head, initial encounter: Secondary | ICD-10-CM | POA: Diagnosis present

## 2021-08-09 LAB — CBC
HCT: 39.7 % (ref 39.0–52.0)
Hemoglobin: 12.4 g/dL — ABNORMAL LOW (ref 13.0–17.0)
MCH: 27.4 pg (ref 26.0–34.0)
MCHC: 31.2 g/dL (ref 30.0–36.0)
MCV: 87.6 fL (ref 80.0–100.0)
Platelets: 314 10*3/uL (ref 150–400)
RBC: 4.53 MIL/uL (ref 4.22–5.81)
RDW: 15.9 % — ABNORMAL HIGH (ref 11.5–15.5)
WBC: 9.6 10*3/uL (ref 4.0–10.5)
nRBC: 0 % (ref 0.0–0.2)

## 2021-08-09 LAB — BASIC METABOLIC PANEL
Anion gap: 8 (ref 5–15)
BUN: 16 mg/dL (ref 8–23)
CO2: 25 mmol/L (ref 22–32)
Calcium: 8.9 mg/dL (ref 8.9–10.3)
Chloride: 100 mmol/L (ref 98–111)
Creatinine, Ser: 1.3 mg/dL — ABNORMAL HIGH (ref 0.61–1.24)
GFR, Estimated: 54 mL/min — ABNORMAL LOW (ref >60)
Glucose, Bld: 290 mg/dL — ABNORMAL HIGH (ref 70–99)
Potassium: 3.7 mmol/L (ref 3.5–5.1)
Sodium: 133 mmol/L — ABNORMAL LOW (ref 135–145)

## 2021-08-09 NOTE — Discharge Instructions (Signed)
Your CT scan of the head today was unremarkable.  Your other lab tests were all reassuring.  Your symptoms today appear to be due to dehydration after blood donation.  You should feel more back to normal tomorrow.  Continue eating regularly and drinking plenty of fluids to stay hydrated.  You can continue taking all of your medicine as usual.

## 2021-08-09 NOTE — ED Provider Notes (Signed)
[facility] Provider Note    Event Date/Time   First MD Initiated Contact with Patient 08/09/21 2026     (approximate)   History   Near Syncope   HPI  Jeffrey Rojas is a 87 y.o. male with a history of prostate cancer, prior stroke, atrial fibrillation on Eliquis who is brought to the ED due to getting lightheaded as he was leaving a blood donation site.  Reports that he donates blood frequently and usually does not have a problem.  Today he was feeling in his usual state of health, went into donate blood as usual.  Sat at the snack table for a while, and then when he walked outside to leave, he felt like he would pass out and had to sit down.  EMS report blood pressure was initially low.  Currently patient denies any symptoms.  He denies any prodromal symptoms from the lightheadedness such as chest pain headache vision changes paresthesias weakness shortness of breath or abdominal pain.  Denies any symptoms currently.  No recent vomiting or diarrhea or fever.  Patient does note that 2 days ago he inadvertently fell and hit his head.  He is on Eliquis and has been compliant.     Physical Exam   Triage Vital Signs: ED Triage Vitals  Enc Vitals Group     BP 08/09/21 1845 122/70     Pulse Rate 08/09/21 1845 82     Resp 08/09/21 1845 16     Temp 08/09/21 1845 98.6 F (37 C)     Temp src --      SpO2 08/09/21 1845 100 %     Weight 08/09/21 1846 135 lb (61.2 kg)     Height 08/09/21 1846 5\' 9"  (1.753 m)     Head Circumference --      Peak Flow --      Pain Score 08/09/21 1845 0     Pain Loc --      Pain Edu? --      Excl. in [place]? --     Most recent vital signs: Vitals:   08/09/21 1845  BP: 122/70  Pulse: 82  Resp: 16  Temp: 98.6 F (37 C)  SpO2: 100%     General: Awake, no distress.  CV:  Good peripheral perfusion.  Regular rate Resp:  Normal effort.  Clear to auscultation bilaterally Abd:  No distention.  Soft and nontender Other:  No  lower extremity edema.  No pallor.  There is a 2 cm scalp hematoma on the left forehead.   ED Results / Procedures / Treatments   Labs (all labs ordered are listed, but only abnormal results are displayed) Labs Reviewed  BASIC METABOLIC PANEL - Abnormal; Notable for the following components:      Result Value   Sodium 133 (*)    Glucose, Bld 290 (*)    Creatinine, Ser 1.30 (*)    GFR, Estimated 54 (*)    All other components within normal limits  CBC - Abnormal; Notable for the following components:   Hemoglobin 12.4 (*)    RDW 15.9 (*)    All other components within normal limits  URINALYSIS, ROUTINE W REFLEX MICROSCOPIC     EKG  Interpreted by me Normal sinus rhythm rate of 85.  Normal axis and intervals.  Poor R wave progression.  Normal ST segments and T waves.  No ischemic changes.   RADIOLOGY CT head viewed and interpreted by me, negative for intracranial  hemorrhage.  Radiology report reviewed    PROCEDURES:  Critical Care performed: No  Procedures   MEDICATIONS ORDERED IN ED: Medications - No data to display   IMPRESSION / MDM / [place] / ED COURSE  I reviewed the triage vital signs and the nursing notes.                              Differential diagnosis includes, but is not limited to, intracranial hemorrhage, orthostatic hypotension secondary to acute blood loss from donation, electrolyte abnormality, anemia    Patient presents with episode of lightheadedness after blood donation.  No other acute symptoms or exam findings to suggest an acute event other than being symptomatic from the blood donation itself.  Due to his fall 2 days ago with signs of head trauma and being on Eliquis, CT scan was performed today to look for intracranial venous bleeding.  This is negative.  Labs are reassuring, patient is asymptomatic able to stand and walk in the ED without getting lightheaded.  Does not require admission and is stable for discharge.      FINAL CLINICAL IMPRESSION(S) / ED DIAGNOSES   Final diagnoses:  Orthostatic dizziness  Contusion of scalp, initial encounter     Rx / DC Orders   ED Discharge Orders     None        Note:  This document was prepared using Dragon voice recognition software and may include unintentional dictation errors.   Carrie Mew, MD 08/09/21 2236

## 2021-08-09 NOTE — ED Triage Notes (Signed)
[facility] [facility]. Donates blood every 6 weeks and has never had this happen before.  Pt was initially hypotensive with EMS.  Pt HOH

## 2021-08-09 NOTE — ED Notes (Signed)
Pt given OJ and pb crackers

## 2021-09-27 ENCOUNTER — Other Ambulatory Visit: Payer: Self-pay | Admitting: Internal Medicine

## 2021-09-27 DIAGNOSIS — I480 Paroxysmal atrial fibrillation: Secondary | ICD-10-CM

## 2021-09-27 NOTE — Telephone Encounter (Signed)
Prescription refill request for Eliquis received. ?Indication: CVA ?Last office visit: 07/13/20  Olin Pia MD ?Scr: 1.2 on 08/30/21 ?Age: 87 ?Weight: 64.4kg ? ?Based on above findings Eliquis '5mg'$  twice daily is the appropriate doe.  Refill approved. ? ?Pt has an upcoming appt with Dr Caryl Comes on 11/01/21 ? ? ?

## 2021-09-27 NOTE — Telephone Encounter (Signed)
Please review

## 2021-10-05 ENCOUNTER — Other Ambulatory Visit: Payer: Self-pay | Admitting: Internal Medicine

## 2021-10-05 DIAGNOSIS — I480 Paroxysmal atrial fibrillation: Secondary | ICD-10-CM

## 2021-10-05 NOTE — Telephone Encounter (Signed)
Eliquis '5mg'$  refill request received. Patient is 87 years old, weight-61.2kg, Crea-1.30 on 08/09/2021, Diagnosis-Afib & CVA, and last seen by Dr. Caryl Comes on 07/13/2020 and has an appt pending with Dr. Caryl Comes on 11/01/2021. Dose is appropriate based on dosing criteria.  ? ?Eliquis '5mg'$  refill was sent on 09/27/2021 with 3 refills.  ? ?Spoke with Nadara Mustard with [facility] and he states they have not received the refill request, therefore, gave verbal order for the same Eliquis '5mg'$  prescription that was sent on 09/27/2021; Eliquis '5mg'$  BID PO 60 tabs with 3 refills. ?

## 2021-11-01 ENCOUNTER — Ambulatory Visit: Payer: Medicare Other | Admitting: Internal Medicine

## 2021-11-02 ENCOUNTER — Encounter: Payer: Self-pay | Admitting: Internal Medicine

## 2021-11-18 ENCOUNTER — Other Ambulatory Visit: Payer: Self-pay | Admitting: Internal Medicine

## 2021-11-18 DIAGNOSIS — I480 Paroxysmal atrial fibrillation: Secondary | ICD-10-CM

## 2021-11-18 NOTE — Telephone Encounter (Addendum)
Prescription refill request for Eliquis received. Indication: Afib/CVA Last office visit: 07/13/20 Jeffrey Rojas)  Scr: 1.2 (08/30/21)  Age: 87 Weight: 61.2kg  Pt overdue for office visit. Message sent to schedulers. Called pt, no answer. Left message to call back.   Pt now has schedule appt with Dr Jeffrey Rojas on 12/06/21. Appropriate dose and refill sent to requested pharmacy.

## 2021-12-06 ENCOUNTER — Ambulatory Visit: Payer: Medicare Other | Admitting: Internal Medicine

## 2021-12-07 ENCOUNTER — Encounter: Payer: Self-pay | Admitting: Internal Medicine

## 2021-12-13 ENCOUNTER — Encounter: Payer: Self-pay | Admitting: Internal Medicine

## 2021-12-13 ENCOUNTER — Ambulatory Visit (INDEPENDENT_AMBULATORY_CARE_PROVIDER_SITE_OTHER): Payer: Medicare Other | Admitting: Internal Medicine

## 2021-12-13 DIAGNOSIS — I480 Paroxysmal atrial fibrillation: Secondary | ICD-10-CM | POA: Diagnosis not present

## 2021-12-13 MED ORDER — APIXABAN 2.5 MG PO TABS: 2.5000 mg | ORAL_TABLET | Freq: Two times a day (BID) | ORAL | 0 refills | Status: DC

## 2021-12-13 MED ORDER — AMLODIPINE BESYLATE 10 MG PO TABS: 10.0000 mg | ORAL_TABLET | Freq: Every day | ORAL | 5 refills | Status: DC

## 2021-12-13 NOTE — Patient Instructions (Signed)
Medication Instructions:  Your physician has recommended you make the following change in your medication:   - STOP Lotrel.  - START Amlodipine to 40 mg daily.  - REDUCE Eliquis to 2.5 mg twice a day.  *If you need a refill on your cardiac medications before your next appointment, please call your pharmacy*   Lab Work: None ordered  If you have labs (blood work) drawn today and your tests are completely normal, you will receive your results only by: [place] (if you have MyChart) OR A paper copy in the mail If you have any lab test that is abnormal or we need to change your treatment, we will call you to review the results.   Testing/Procedures: None ordered   Follow-Up: At [facility], you and your health needs are our priority.  As part of our continuing mission to provide you with exceptional heart care, we have created designated Provider Care Teams.  These Care Teams include your primary Cardiologist (physician) and Advanced Practice Providers (APPs -  Physician Assistants and Nurse Practitioners) who all work together to provide you with the care you need, when you need it.  We recommend signing up for the patient portal called "MyChart".  Sign up information is provided on this After Visit Summary.  MyChart is used to connect with patients for Virtual Visits (Telemedicine).  Patients are able to view lab/test results, encounter notes, upcoming appointments, etc.  Non-urgent messages can be sent to your provider as well.   To learn more about what you can do with MyChart, go to [email or web address].    Your next appointment:   Your physician wants you to follow-up in: 1 year You will receive a reminder letter in the mail two months in advance. If you don't receive a letter, please call our office to schedule the follow-up appointment.   The format for your next appointment:   In Person  Provider:   Virl Axe, MD   Other Instructions N/A  Important  Information About Sugar

## 2021-12-13 NOTE — Progress Notes (Signed)
Patient Care Team: Baxter Hire, MD as PCP - General (Internal Medicine) Skeet Latch, MD as PCP - Cardiology (Cardiology)   HPI  Jeffrey Rojas is a 87 y.o. male Seen in followup for LINQ implantation 1/19 for Cryptogenic Stroke>>   transmissions demonstrate atrial fibrillation.  >> anticoagulation with apixoban     The patient denies chest pain, shortness of breath, nocturnal dyspnea, orthopnea or peripheral edema.  There have been no palpitation or syncope.  Complains of lightheadedness and balance issues with presyncope.   No bleeding   Issues at last vist with ongoing crying- much less evident now with effexor added to celexa   Losing weight (160>>140 1/19>>1/22) with decreased appetite  weight relatively stable  Date weight  1/19 160  1/22 140  6/23 135      DATE TEST EF   1/19 Echo   60-65 %   1/19 TEE 65% AI mod           Records and Results Reviewed   Date Cr K Hgb  1/19 1.13    6/19  0.97 4.1   7/20 1.1 4.0 14.2 (4/20)  11/21 1.0 4.3 12.8 (8/21)  2/23 1.3 3.7 12.4    Last TSH 1/63 Thromboembolic risk factors ( age  -2, HTN-1, DM-1) for a CHADSVASc Score of >=4   Past Medical History:  Diagnosis Date   Arthritis    Cancer ([place])    History of prostate cancer    Stroke ([place]) 07/2017    Past Surgical History:  Procedure Laterality Date   LOOP RECORDER INSERTION N/A 07/11/2017   Procedure: LOOP RECORDER INSERTION;  Surgeon: Constance Haw, MD;  Location: [facility];  Service: Cardiovascular;  Laterality: N/A;   NO PAST SURGERIES     TEE WITHOUT CARDIOVERSION N/A 07/11/2017   Procedure: TRANSESOPHAGEAL ECHOCARDIOGRAM (TEE);  Surgeon: Acie Fredrickson Wonda Cheng, MD;  Location: [facility] ENDOS[facility];  Service: Cardiovascular;  Laterality: N/A;    Current Outpatient Medications  Medication Sig Dispense Refill   amLODipine-benazepril (LOTREL) 10-20 MG capsule Take 1 capsule by mouth daily.      apixaban (ELIQUIS) 5 MG TABS tablet Take 1 tablet  by mouth twice daily 60 tablet 2   atorvastatin (LIPITOR) 20 MG tablet Take 1 tablet (20 mg total) by mouth daily at 6 PM. 90 tablet 0   citalopram (CELEXA) 20 MG tablet Take 20 mg by mouth daily.      docusate sodium (COLACE) 100 MG capsule Take 200 mg by mouth 2 (two) times daily.     Ferrous Sulfate (IRON) 325 (65 Fe) MG TABS Take 325 mg by mouth daily.      fluticasone (FLONASE) 50 MCG/ACT nasal spray Place 1 spray into both nostrils daily.     hydrochlorothiazide (HYDRODIURIL) 25 MG tablet Take 25 mg by mouth daily.     levocetirizine (XYZAL) 5 MG tablet Take 1 tablet by mouth at bedtime.     metFORMIN (GLUCOPHAGE) 1000 MG tablet Take 1 tablet by mouth 2 (two) times daily with a meal.     metFORMIN (GLUCOPHAGE) 500 MG tablet Take 1 tablet (500 mg) by mouth twice daily     nitroGLYCERIN (NITROSTAT) 0.4 MG SL tablet Place 1 tablet (0.4 mg total) under the tongue every 5 (five) minutes as needed for chest pain (notify PCP if experiencing chest pain and need to take Nitroglycerin). 30 tablet 1   venlafaxine XR (EFFEXOR-XR) 150 MG 24 hr capsule Take 150 mg by  mouth daily.     No current facility-administered medications for this visit.    No Known Allergies    Review of Systems negative except from HPI and PMH  Physical Exam BP 124/80 (BP Location: Left Arm, Patient Position: Sitting, Cuff Size: Normal)   Pulse 74   Ht '5\' 9"'$  (1.753 m)   Wt 135 lb (61.2 kg)   SpO2 98%   BMI 19.94 kg/m  Well developed and nourished in no acute distress HENT normal Neck supple with JVP-  flat  Clear Regular rate and rhythm, 2/6 systolic m w preserved S2 Abd-soft with active BS No Clubbing cyanosis edema Skin-warm and dry A & Oriented  Grossly normal sensory and motor function  ECG sinus @ 74 19/08/38      Assessment and  Plan  Cryptogenic stroke  Loop recorder in place EOS  AS/ AI   Moderate  Hyperlipidemia  Hypertension  Atrial fibrillation/flutter  SVT-intercurrent   Weight  loss stable  Emotional lability better  HTN   controlled on amlodipine/benazepril 10/20 and HCTZ but with significant orthostatic hypotension and symptoms  will stop benazepril and use amlodipine by itself at 10 mg  With ongoing weight loss and age, now Apixaban  dose appropriately at 2.5 bid

## 2021-12-26 ENCOUNTER — Telehealth: Payer: Self-pay | Admitting: Internal Medicine

## 2021-12-26 NOTE — Telephone Encounter (Signed)
Spoke with Sallye Ober, relayed that per chart, patient should:   Stop Lotrel  Start amlodipine 10 mg daily  Reduce Eliquis to 2.5 mg twice a day.   Sallye Ober wrote this down and read back the instructions for accuracy.   Sandie Ano, RN

## 2021-12-28 ENCOUNTER — Telehealth: Payer: Self-pay | Admitting: Emergency Medicine

## 2021-12-28 NOTE — Telephone Encounter (Signed)
Patient came in to front desk with boxes of his medications.  Was unable to tell front desk exactly why he was here nor what he wanted.   I went out to speak with patient. Spent 30-40 minutes with patient.    Pt stated that he was thinking he needed to send his medications back, as that was a lot of medication to have. Pt had with him 4 boxes of divided dose medications, with what appeared to be the majority of June's medications as well as July's medications.   I contacted pt's significant other and left a message to see if she knew why he was here, then contacted pt's daughter's Levada Dy. Levada Dy wasn't sure why patient was here. Attempted to call patient on his cell phone, but patient was on phone with Barbaraann Share, his significant other. Call was disconnected and Barbaraann Share and Levada Dy both tried multiple times to call patient back on his cell phone. Pt was starting to seemingly get frustrated and cry.   Took patient to desk to use land line and called Louise back. Barbaraann Share spoke with patient. Patient expressed that he thought maybe he should send some medications back, and he thought that one of the medications was cut in half. Barbaraann Share told him that she had gotten those instructions earlier in the week. I spoke with Barbaraann Share and the patient both, and went over again what medications he was to be taking as well as the doses. On patient's boxes of medications, I indicated to him which pill (lotrel) he is not to take, and told Barbaraann Share that I was crossing that one off so he would know not to take it. Pt verbalized understanding and knows that Barbaraann Share knows his correct medications.   Also advised Barbaraann Share that it seemed as though he has not been taking his meds on a daily basis, given his June supply was mostly intact.   Louise asked patient if he felt ok to drive. Patient said yes, but then changed his mind and said he would like to have someone pick him up. I offered to help patient upstairs, but he stated that he could  get out on his own.   Called Levada Dy to update her of conversation, and also let her know that patient appears to not be taking his meds.

## 2022-01-23 ENCOUNTER — Other Ambulatory Visit: Payer: Self-pay | Admitting: Internal Medicine

## 2022-01-23 DIAGNOSIS — I480 Paroxysmal atrial fibrillation: Secondary | ICD-10-CM

## 2022-01-23 NOTE — Telephone Encounter (Signed)
Refill Request.  

## 2022-06-16 ENCOUNTER — Other Ambulatory Visit: Payer: Self-pay | Admitting: Internal Medicine

## 2022-06-16 NOTE — Telephone Encounter (Signed)
Prescription refill request for Eliquis received. Indication:afib Last office visit:6/23 Scr:1.2 Age: 87 Weight:61.2  kg  Prescription refilled

## 2022-10-06 IMAGING — CT CT HEAD W/O CM
4 series · 16 of 47 positions shown, 18 images · non-contrast
Comparison: 07/01/2021

CLINICAL DATA: Head trauma, intracranial venous injury suspected.
Near syncope after donating blood.



[Series 2: head wo · axial · 0.43mm/px · z∈[-146,-31]mm · 7 of 31 slices shown, 9 images]
[im 4/31  brain]
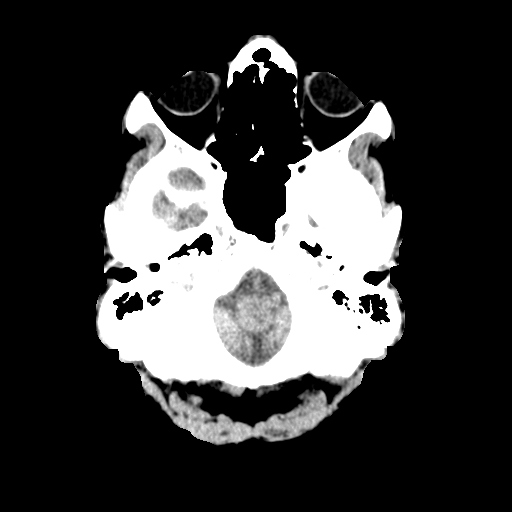
[im 4/31  bone]
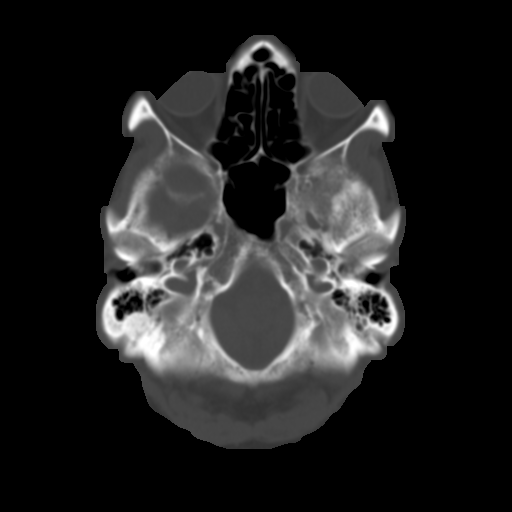
[im 8/31  brain]
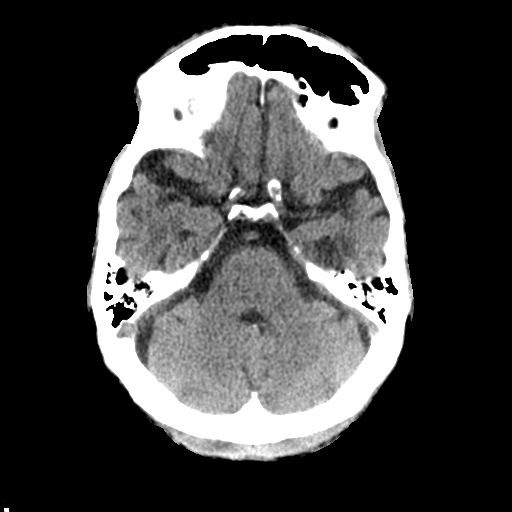
[im 12/31  brain]
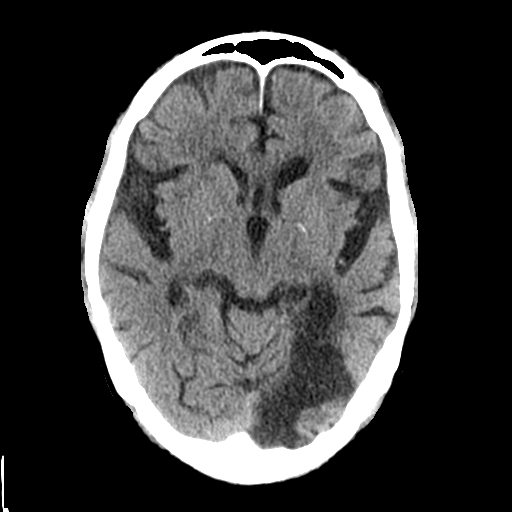
[im 16/31  brain]
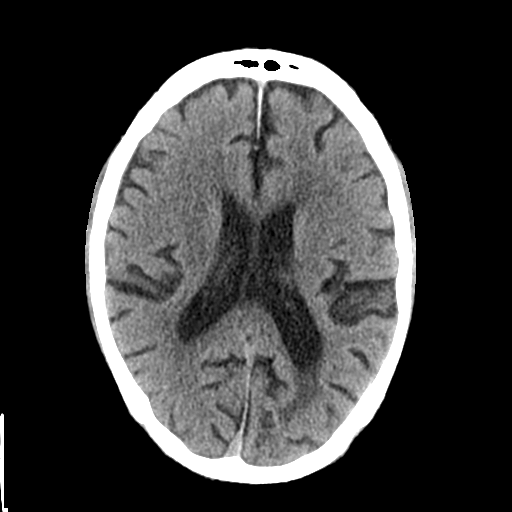
[im 19/31  brain]
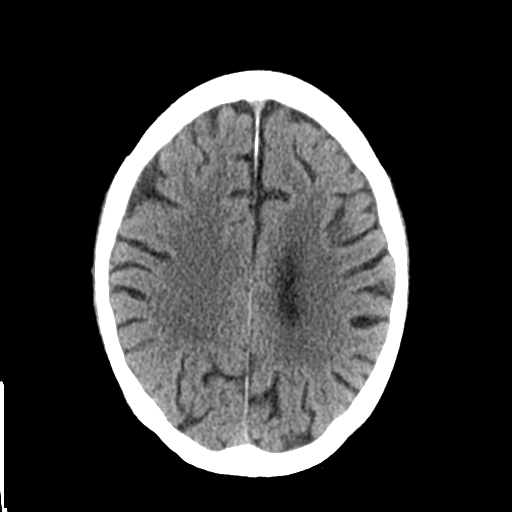
[im 19/31  bone]
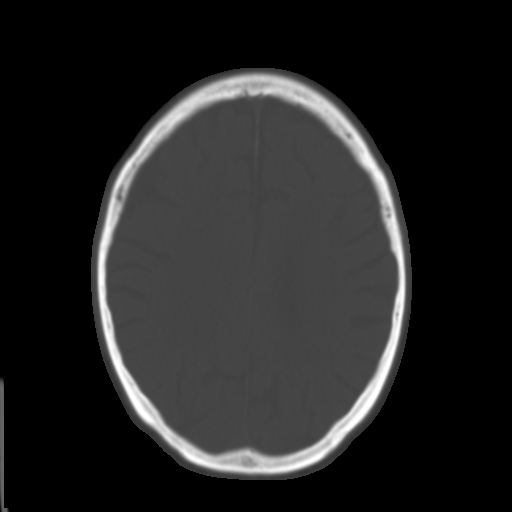
[im 23/31  brain]
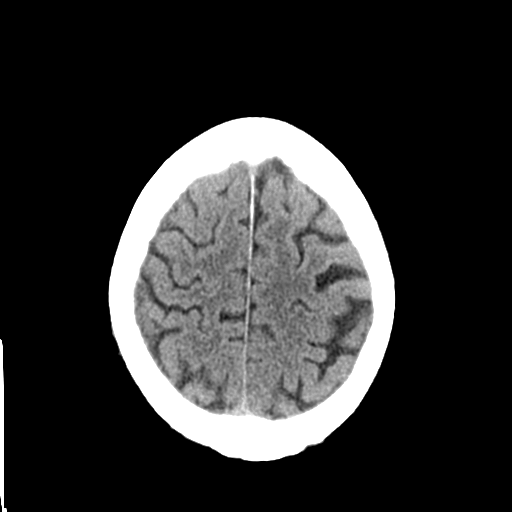
[im 27/31  brain]
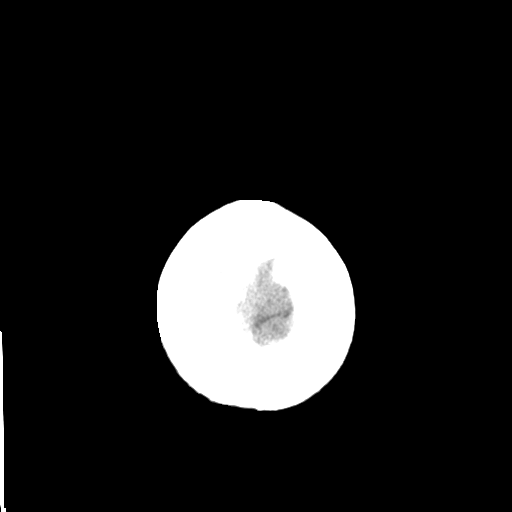

[Series 3: head bone · axial · 0.43mm/px · z∈[-147,-117]mm · 3 of 76 slices shown]
[im 8/76  bone]
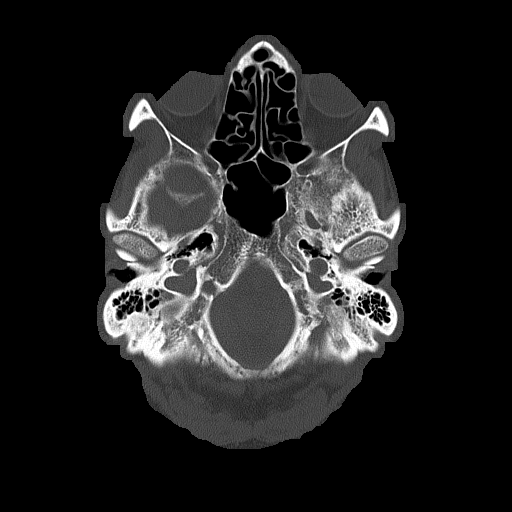
[im 16/76  bone]
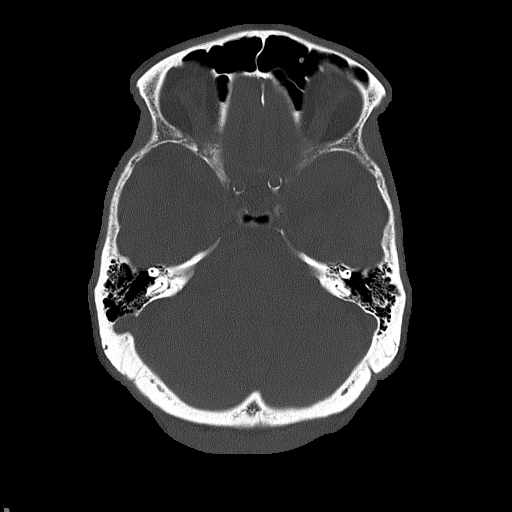
[im 23/76  bone]
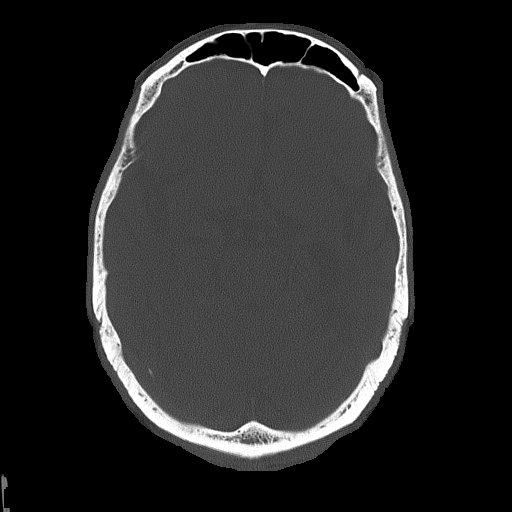

[Series 4: coronal soft tissue · coronal · 0.30mm/px · 3 of 67 slices shown]
[im 23/67  brain]
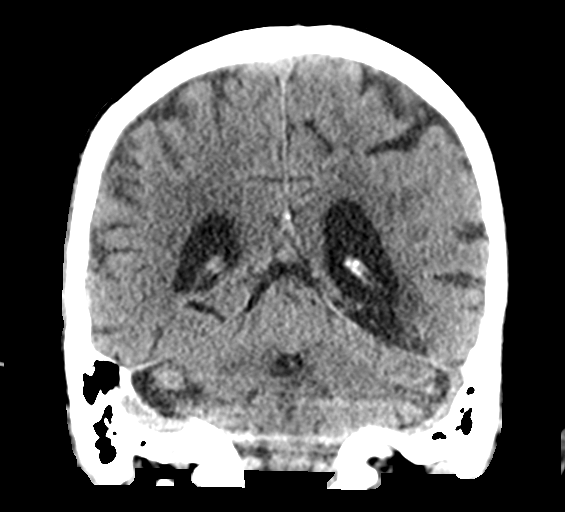
[im 30/67  brain]
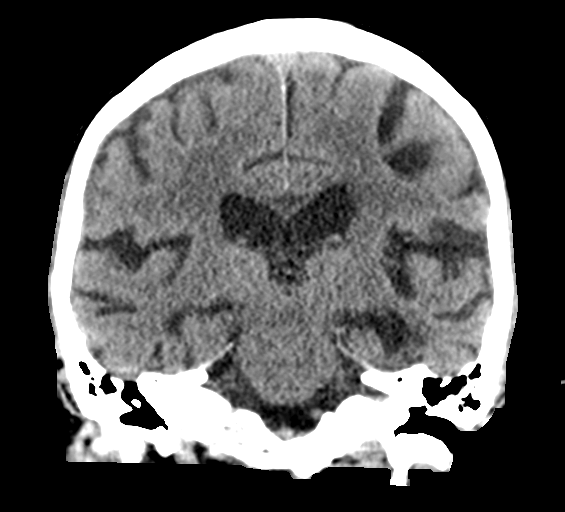
[im 37/67  brain]
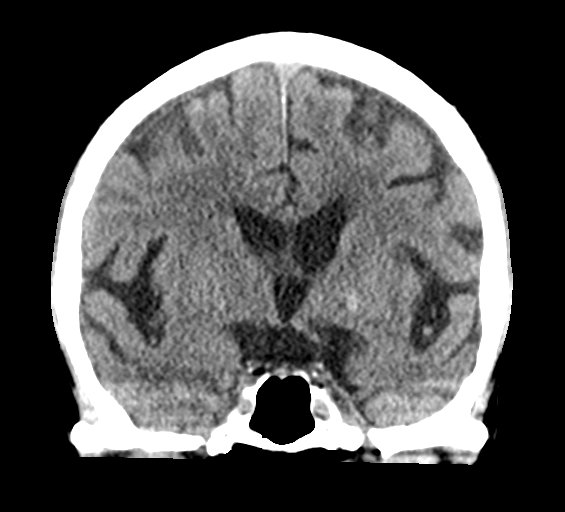

[Series 5: sagittal soft tissue · sagittal · 0.30mm/px · 3 of 55 slices shown]
[im 19/55  brain]
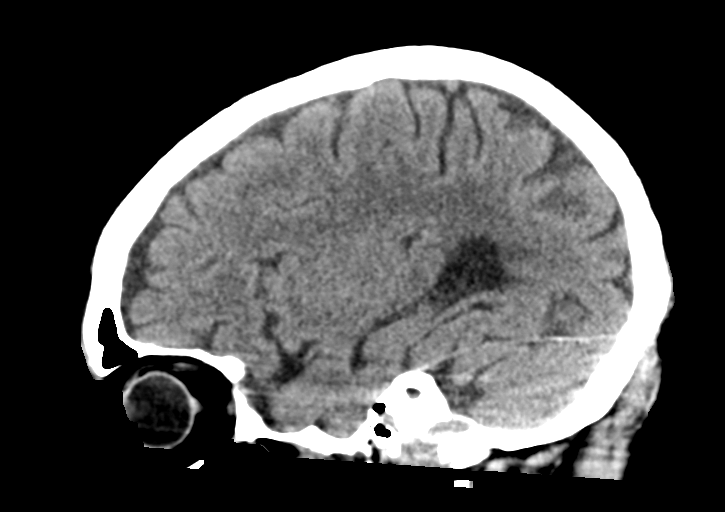
[im 28/55  brain]
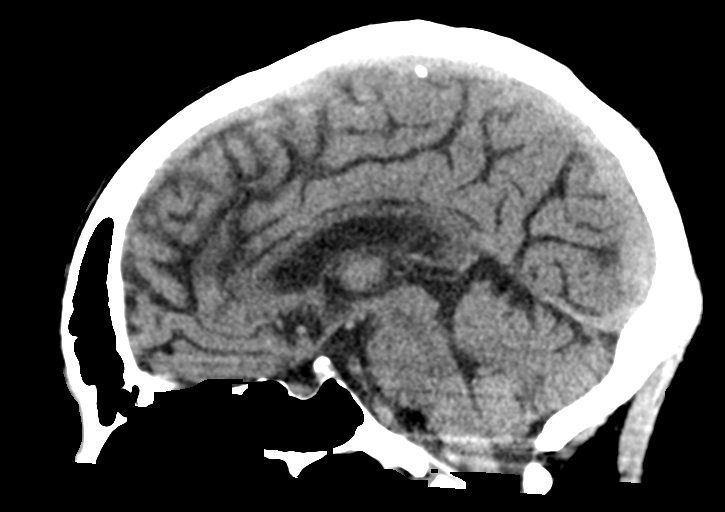
[im 37/55  brain]
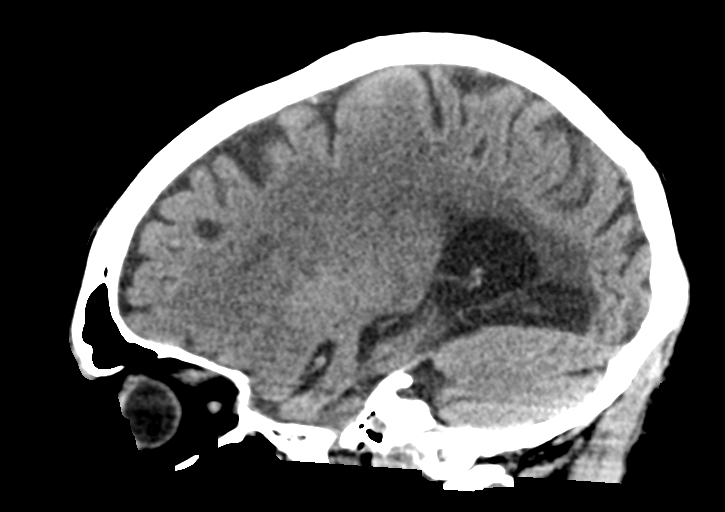

[16 of 47 positions shown; findings below may reference images not displayed]

FINDINGS: Brain: Old left occipital infarct is unchanged. No acute
intracranial abnormality. Specifically, no hemorrhage,
hydrocephalus, mass lesion, acute infarction, or significant
intracranial injury.

Vascular: No hyperdense vessel or unexpected calcification.

Skull: No acute calvarial abnormality.

Sinuses/Orbits: No acute findings

Other: None
IMPRESSION: Old left occipital infarct.

No acute intracranial abnormality.

## 2022-11-09 ENCOUNTER — Encounter (HOSPITAL_COMMUNITY): Payer: Self-pay

## 2022-11-09 ENCOUNTER — Emergency Department (HOSPITAL_COMMUNITY): Payer: Medicare Other

## 2022-11-09 ENCOUNTER — Observation Stay (HOSPITAL_COMMUNITY)
Admission: EM | Admit: 2022-11-09 | Discharge: 2022-11-10 | Disposition: A | Discharge status: Home / Self Care | Payer: Medicare Other | Attending: Internal Medicine | Admitting: Internal Medicine

## 2022-11-09 ENCOUNTER — Other Ambulatory Visit: Payer: Self-pay

## 2022-11-09 DIAGNOSIS — F32A Depression, unspecified: Secondary | ICD-10-CM | POA: Diagnosis present

## 2022-11-09 DIAGNOSIS — M6281 Muscle weakness (generalized): Secondary | ICD-10-CM | POA: Insufficient documentation

## 2022-11-09 DIAGNOSIS — R55 Syncope and collapse: Secondary | ICD-10-CM | POA: Diagnosis not present

## 2022-11-09 DIAGNOSIS — I10 Essential (primary) hypertension: Secondary | ICD-10-CM | POA: Diagnosis not present

## 2022-11-09 DIAGNOSIS — Z79899 Other long term (current) drug therapy: Secondary | ICD-10-CM | POA: Insufficient documentation

## 2022-11-09 DIAGNOSIS — Z8673 Personal history of transient ischemic attack (TIA), and cerebral infarction without residual deficits: Secondary | ICD-10-CM | POA: Diagnosis not present

## 2022-11-09 DIAGNOSIS — Z7984 Long term (current) use of oral hypoglycemic drugs: Secondary | ICD-10-CM | POA: Diagnosis not present

## 2022-11-09 DIAGNOSIS — E872 Acidosis, unspecified: Secondary | ICD-10-CM | POA: Diagnosis not present

## 2022-11-09 DIAGNOSIS — I480 Paroxysmal atrial fibrillation: Secondary | ICD-10-CM | POA: Diagnosis not present

## 2022-11-09 DIAGNOSIS — R2681 Unsteadiness on feet: Secondary | ICD-10-CM | POA: Insufficient documentation

## 2022-11-09 DIAGNOSIS — Z8546 Personal history of malignant neoplasm of prostate: Secondary | ICD-10-CM | POA: Diagnosis not present

## 2022-11-09 DIAGNOSIS — E119 Type 2 diabetes mellitus without complications: Secondary | ICD-10-CM | POA: Diagnosis not present

## 2022-11-09 DIAGNOSIS — E785 Hyperlipidemia, unspecified: Secondary | ICD-10-CM | POA: Diagnosis present

## 2022-11-09 DIAGNOSIS — Z7901 Long term (current) use of anticoagulants: Secondary | ICD-10-CM | POA: Diagnosis not present

## 2022-11-09 DIAGNOSIS — R4182 Altered mental status, unspecified: Secondary | ICD-10-CM

## 2022-11-09 LAB — I-STAT VENOUS BLOOD GAS, ED
Acid-Base Excess: 4 mmol/L — ABNORMAL HIGH (ref 0.0–2.0)
Bicarbonate: 32.1 mmol/L — ABNORMAL HIGH (ref 20.0–28.0)
Calcium, Ion: 1.19 mmol/L (ref 1.15–1.40)
HCT: 49 % (ref 39.0–52.0)
Hemoglobin: 16.7 g/dL (ref 13.0–17.0)
O2 Saturation: 32 %
Potassium: 4.5 mmol/L (ref 3.5–5.1)
Sodium: 141 mmol/L (ref 135–145)
TCO2: 34 mmol/L — ABNORMAL HIGH (ref 22–32)
pCO2, Ven: 59 mmHg (ref 44–60)
pH, Ven: 7.343 (ref 7.25–7.43)
pO2, Ven: 22 mmHg — CL (ref 32–45)

## 2022-11-09 LAB — LACTIC ACID, PLASMA
Lactic Acid, Venous: 2.6 mmol/L (ref 0.5–1.9)
Lactic Acid, Venous: 3.2 mmol/L (ref 0.5–1.9)
Lactic Acid, Venous: 4.8 mmol/L (ref 0.5–1.9)

## 2022-11-09 LAB — CBC WITH DIFFERENTIAL/PLATELET
Abs Immature Granulocytes: 0.01 10*3/uL (ref 0.00–0.07)
Basophils Absolute: 0 10*3/uL (ref 0.0–0.1)
Basophils Relative: 0 %
Eosinophils Absolute: 0 10*3/uL (ref 0.0–0.5)
Eosinophils Relative: 0 %
HCT: 48.4 % (ref 39.0–52.0)
Hemoglobin: 15.4 g/dL (ref 13.0–17.0)
Immature Granulocytes: 0 %
Lymphocytes Relative: 45 %
Lymphs Abs: 2.8 10*3/uL (ref 0.7–4.0)
MCH: 28.1 pg (ref 26.0–34.0)
MCHC: 31.8 g/dL (ref 30.0–36.0)
MCV: 88.2 fL (ref 80.0–100.0)
Monocytes Absolute: 0.5 10*3/uL (ref 0.1–1.0)
Monocytes Relative: 8 %
Neutro Abs: 2.9 10*3/uL (ref 1.7–7.7)
Neutrophils Relative %: 47 %
Platelets: 282 10*3/uL (ref 150–400)
RBC: 5.49 MIL/uL (ref 4.22–5.81)
RDW: 13.5 % (ref 11.5–15.5)
WBC: 6.2 10*3/uL (ref 4.0–10.5)
nRBC: 0 % (ref 0.0–0.2)

## 2022-11-09 LAB — COMPREHENSIVE METABOLIC PANEL
ALT: 18 U/L (ref 0–44)
AST: 20 U/L (ref 15–41)
Albumin: 4 g/dL (ref 3.5–5.0)
Alkaline Phosphatase: 110 U/L (ref 38–126)
Anion gap: 12 (ref 5–15)
BUN: 12 mg/dL (ref 8–23)
CO2: 27 mmol/L (ref 22–32)
Calcium: 9.8 mg/dL (ref 8.9–10.3)
Chloride: 101 mmol/L (ref 98–111)
Creatinine, Ser: 1.13 mg/dL (ref 0.61–1.24)
GFR, Estimated: >60 mL/min (ref >60)
Glucose, Bld: 129 mg/dL — ABNORMAL HIGH (ref 70–99)
Potassium: 4.6 mmol/L (ref 3.5–5.1)
Sodium: 140 mmol/L (ref 135–145)
Total Bilirubin: 0.8 mg/dL (ref 0.3–1.2)
Total Protein: 7.8 g/dL (ref 6.5–8.1)

## 2022-11-09 LAB — TROPONIN I (HIGH SENSITIVITY)
Troponin I (High Sensitivity): 8 ng/L (ref <18)
Troponin I (High Sensitivity): 12 ng/L (ref <18)

## 2022-11-09 LAB — RAPID URINE DRUG SCREEN, HOSP PERFORMED
Amphetamines: NOT DETECTED
Barbiturates: NOT DETECTED
Benzodiazepines: NOT DETECTED
Cocaine: NOT DETECTED
Opiates: NOT DETECTED
Tetrahydrocannabinol: NOT DETECTED

## 2022-11-09 LAB — URINALYSIS, ROUTINE W REFLEX MICROSCOPIC
Bilirubin Urine: NEGATIVE
Glucose, UA: 150 mg/dL — AB
Hgb urine dipstick: NEGATIVE
Ketones, ur: 5 mg/dL — AB
Leukocytes,Ua: NEGATIVE
Nitrite: NEGATIVE
Protein, ur: NEGATIVE mg/dL
Specific Gravity, Urine: 1.014 (ref 1.005–1.030)
pH: 7 (ref 5.0–8.0)

## 2022-11-09 LAB — CK: Total CK: 83 U/L (ref 49–397)

## 2022-11-09 LAB — MAGNESIUM: Magnesium: 1.6 mg/dL — ABNORMAL LOW (ref 1.7–2.4)

## 2022-11-09 MED ORDER — SENNOSIDES-DOCUSATE SODIUM 8.6-50 MG PO TABS: 1.0000 | ORAL_TABLET | Freq: Every evening | ORAL | Status: DC | PRN

## 2022-11-09 MED ORDER — VENLAFAXINE HCL ER 150 MG PO CP24
150.0000 mg | ORAL_CAPSULE | Freq: Every day | ORAL | Status: DC
  Administered 2022-11-10: 150 mg via ORAL
  Filled 2022-11-09: qty 1

## 2022-11-09 MED ORDER — POTASSIUM CHLORIDE IN NACL 20-0.9 MEQ/L-% IV SOLN
INTRAVENOUS | Status: AC
  Filled 2022-11-09 (×3): qty 1000

## 2022-11-09 MED ORDER — SODIUM CHLORIDE 0.9% FLUSH
3.0000 mL | Freq: Two times a day (BID) | INTRAVENOUS | Status: DC
  Administered 2022-11-09 – 2022-11-10 (×2): 3 mL via INTRAVENOUS

## 2022-11-09 MED ORDER — APIXABAN 2.5 MG PO TABS
2.5000 mg | ORAL_TABLET | Freq: Two times a day (BID) | ORAL | Status: DC
  Administered 2022-11-10: 2.5 mg via ORAL
  Filled 2022-11-09: qty 1

## 2022-11-09 MED ORDER — ACETAMINOPHEN 650 MG RE SUPP: 650.0000 mg | Freq: Four times a day (QID) | RECTAL | Status: DC | PRN

## 2022-11-09 MED ORDER — LACTATED RINGERS IV BOLUS
1000.0000 mL | Freq: Once | INTRAVENOUS | Status: AC
  Administered 2022-11-09: 1000 mL via INTRAVENOUS

## 2022-11-09 MED ORDER — BUSPIRONE HCL 5 MG PO TABS
5.0000 mg | ORAL_TABLET | Freq: Two times a day (BID) | ORAL | Status: DC
  Administered 2022-11-09 – 2022-11-10 (×2): 5 mg via ORAL
  Filled 2022-11-09 (×2): qty 1

## 2022-11-09 MED ORDER — ONDANSETRON HCL 4 MG PO TABS: 4.0000 mg | ORAL_TABLET | Freq: Four times a day (QID) | ORAL | Status: DC | PRN

## 2022-11-09 MED ORDER — MAGNESIUM SULFATE 2 GM/50ML IV SOLN
2.0000 g | Freq: Once | INTRAVENOUS | Status: AC
  Administered 2022-11-09: 2 g via INTRAVENOUS
  Filled 2022-11-09: qty 50

## 2022-11-09 MED ORDER — ACETAMINOPHEN 325 MG PO TABS: 650.0000 mg | ORAL_TABLET | Freq: Four times a day (QID) | ORAL | Status: DC | PRN

## 2022-11-09 MED ORDER — ATORVASTATIN CALCIUM 10 MG PO TABS
20.0000 mg | ORAL_TABLET | Freq: Every day | ORAL | Status: DC
  Administered 2022-11-10: 20 mg via ORAL
  Filled 2022-11-09: qty 2

## 2022-11-09 MED ORDER — ONDANSETRON HCL 4 MG/2ML IJ SOLN: 4.0000 mg | Freq: Four times a day (QID) | INTRAMUSCULAR | Status: DC | PRN

## 2022-11-09 NOTE — Hospital Course (Signed)
Jeffrey Rojas is a 88 y.o. male with medical history significant for PAF on Eliquis, history of CVA, T2DM, HTN, HLD, prostate cancer who is admitted for evaluation of suspected syncope and fall at home.

## 2022-11-09 NOTE — ED Triage Notes (Signed)
Pt BIB [facility] from home asa level 2 fall trauma. Pt was found on the floor by neighbor. Pt was initially disoriented  x4 w EMS, then slowly back to A&O X4 en-route w EMS. P[t reported he only remembers going to sleep and does not remember what happened afterwards.   BP 190/110

## 2022-11-09 NOTE — ED Notes (Signed)
Patient ambulated to toilet with minimal assistance , respirations unlabored , denies pain , EDP notified.

## 2022-11-09 NOTE — ED Provider Notes (Signed)
[place] EMERGENCY DEPARTMENT AT [facility] Provider Note   CSN: [id number] Arrival date & time: 11/09/22  1605     History  Chief Complaint  Patient presents with   LEVEL 2 FALL    Jeffrey Rojas is a 88 y.o. male.  HPI 88 year old male history of prior stroke, atrial fibrillation on Eliquis, prostate cancer, arthritis.  Per report from EMS, patient was found by neighbor on the floor unresponsive.  EMS arrived he was confused and disoriented but hemodynamically stable.  Mental status improved on the way here.  Unclear how long patient was on the ground for.  Patient here is alert and oriented.  He denies any pain including headache, neck pain, chest pain, back pain, abdominal pain.  He is unsure of any recent illness.  He does not remember how he got up on the ground at home.  Denies any recent medication changes.  He has otherwise been at his baseline health.     Home Medications Prior to Admission medications   Medication Sig Start Date End Date Taking? Authorizing Provider  amLODipine-benazepril (LOTREL) 10-20 MG capsule Take 1 capsule by mouth daily.   Yes [provider]  atorvastatin (LIPITOR) 20 MG tablet Take 1 tablet (20 mg total) by mouth daily at 6 PM. Patient taking differently: Take 20 mg by mouth daily. 09/12/17  Yes McCue, Shanda Bumps, NP  busPIRone (BUSPAR) 5 MG tablet Take 5 mg by mouth 2 (two) times daily.   Yes [provider]  ELIQUIS 5 MG TABS tablet Take 1 tablet by mouth twice daily 06/16/22  Yes Duke Salvia, MD  hydrochlorothiazide (HYDRODIURIL) 25 MG tablet Take 25 mg by mouth daily.   Yes [provider]  metFORMIN (GLUCOPHAGE) 1000 MG tablet Take 1 tablet by mouth 2 (two) times daily with a meal. 10/07/20  Yes [provider]  nitroGLYCERIN (NITROSTAT) 0.4 MG SL tablet Place 1 tablet (0.4 mg total) under the tongue every 5 (five) minutes as needed for chest pain (notify PCP if experiencing chest pain and need to  take Nitroglycerin). 07/11/17  Yes Beryl Meager, NP  venlafaxine XR (EFFEXOR-XR) 150 MG 24 hr capsule Take 150 mg by mouth daily. 04/20/21  Yes [provider]      Allergies    Patient has no known allergies.    Review of Systems   Review of Systems  Neurological:        Episode of unresponsiveness  All other systems reviewed and are negative.   Physical Exam Updated Vital Signs BP 132/74 (BP Location: Left Arm)   Pulse 65   Temp 98 F (36.7 C) (Oral)   Resp 18   Ht 5\' 9"  (1.753 m)   Wt 53.5 kg   SpO2 98%   BMI 17.42 kg/m  Physical Exam Vitals and nursing note reviewed.  Constitutional:      General: He is not in acute distress.    Appearance: He is well-developed. He is not ill-appearing or diaphoretic.  HENT:     Head: Normocephalic and atraumatic.     Nose: Nose normal. No congestion or rhinorrhea.     Mouth/Throat:     Mouth: Mucous membranes are moist.     Pharynx: Oropharynx is clear.  Eyes:     Extraocular Movements: Extraocular movements intact.     Conjunctiva/sclera: Conjunctivae normal.  Cardiovascular:     Rate and Rhythm: Normal rate and regular rhythm.     Heart sounds: No murmur heard. Pulmonary:  Effort: Pulmonary effort is normal. No respiratory distress.     Breath sounds: Normal breath sounds.  Abdominal:     General: Abdomen is flat.     Palpations: Abdomen is soft.     Tenderness: There is no abdominal tenderness. There is no guarding or rebound.  Musculoskeletal:        General: No swelling or tenderness.     Cervical back: Normal range of motion and neck supple. No rigidity or tenderness.     Comments: No spinal tenderness  Skin:    General: Skin is warm and dry.     Capillary Refill: Capillary refill takes less than 2 seconds.  Neurological:     General: No focal deficit present.     Mental Status: He is alert and oriented to person, place, and time. Mental status is at baseline.     Cranial Nerves: No cranial nerve  deficit.     Sensory: No sensory deficit.  Psychiatric:        Mood and Affect: Mood normal.     ED Results / Procedures / Treatments   Labs (all labs ordered are listed, but only abnormal results are displayed) Labs Reviewed  COMPREHENSIVE METABOLIC PANEL - Abnormal; Notable for the following components:      Result Value   Glucose, Bld 129 (*)    All other components within normal limits  MAGNESIUM - Abnormal; Notable for the following components:   Magnesium 1.6 (*)    All other components within normal limits  LACTIC ACID, PLASMA - Abnormal; Notable for the following components:   Lactic Acid, Venous 2.6 (*)    All other components within normal limits  LACTIC ACID, PLASMA - Abnormal; Notable for the following components:   Lactic Acid, Venous 3.2 (*)    All other components within normal limits  URINALYSIS, ROUTINE W REFLEX MICROSCOPIC - Abnormal; Notable for the following components:   Glucose, UA 150 (*)    Ketones, ur 5 (*)    All other components within normal limits  LACTIC ACID, PLASMA - Abnormal; Notable for the following components:   Lactic Acid, Venous 4.8 (*)    All other components within normal limits  I-STAT VENOUS BLOOD GAS, ED - Abnormal; Notable for the following components:   pO2, Ven 22 (*)    Bicarbonate 32.1 (*)    TCO2 34 (*)    Acid-Base Excess 4.0 (*)    All other components within normal limits  CBC WITH DIFFERENTIAL/PLATELET  RAPID URINE DRUG SCREEN, HOSP PERFORMED  CK  MAGNESIUM  CBC  BASIC METABOLIC PANEL  LACTIC ACID, PLASMA  LACTIC ACID, PLASMA  TROPONIN I (HIGH SENSITIVITY)  TROPONIN I (HIGH SENSITIVITY)    EKG EKG Interpretation  Date/Time:  Thursday Nov 09 2022 16:19:38 EDT Ventricular Rate:  54 PR Interval:  199 QRS Duration: 87 QT Interval:  471 QTC Calculation: 447 R Axis:   47 Text Interpretation: Sinus rhythm Anterior infarct, old Confirmed by Lorre Nick ([id number]) on 11/09/2022 9:27:31 PM  Radiology DG CHEST PORT  1 VIEW  Result Date: 11/09/2022 CLINICAL DATA:  Larey Seat, altered level of consciousness EXAM: PORTABLE CHEST 1 VIEW COMPARISON:  06/15/2021 FINDINGS: Single frontal view of the chest demonstrates an unremarkable cardiac silhouette. Stable ectasia and atherosclerosis of the thoracic aorta. No acute airspace disease, effusion, or pneumothorax. No acute bony abnormality. IMPRESSION: 1. No acute intrathoracic process. Electronically Signed   By: Sharlet Salina M.D.   On: 11/09/2022 17:46   CT Head  Wo Contrast  Result Date: 11/09/2022 CLINICAL DATA:  Possible fall, found down, unwitnessed trauma EXAM: CT HEAD WITHOUT CONTRAST CT CERVICAL SPINE WITHOUT CONTRAST TECHNIQUE: Multidetector CT imaging of the head and cervical spine was performed following the standard protocol without intravenous contrast. Multiplanar CT image reconstructions of the cervical spine were also generated. RADIATION DOSE REDUCTION: This exam was performed according to the departmental dose-optimization program which includes automated exposure control, adjustment of the mA and/or kV according to patient size and/or use of iterative reconstruction technique. COMPARISON:  08/09/2021 FINDINGS: CT HEAD FINDINGS Brain: No evidence of acute infarction, hemorrhage, hydrocephalus, extra-axial collection or mass lesion/mass effect. Periventricular and deep white matter hypodensity. Unchanged encephalomalacia of the medial left occipital lobe (series 3, image 9). Vascular: No hyperdense vessel or unexpected calcification. Skull: Normal. Negative for fracture or focal lesion. Sinuses/Orbits: No acute finding. Other: None. CT CERVICAL SPINE FINDINGS Alignment: Normal. Skull base and vertebrae: No acute fracture. No primary bone lesion or focal pathologic process. Soft tissues and spinal canal: No prevertebral fluid or swelling. No visible canal hematoma. Disc levels: Moderate multilevel disc space height loss and osteophytosis. Upper chest: Negative. Other:  None. IMPRESSION: 1. No acute intracranial pathology. Small-vessel white matter disease and unchanged encephalomalacia of the medial left occipital lobe. 2. No fracture or static subluxation of the cervical spine. 3. Moderate multilevel cervical disc degenerative disease. Electronically Signed   By: Jearld Lesch M.D.   On: 11/09/2022 17:28   CT Cervical Spine Wo Contrast  Result Date: 11/09/2022 CLINICAL DATA:  Possible fall, found down, unwitnessed trauma EXAM: CT HEAD WITHOUT CONTRAST CT CERVICAL SPINE WITHOUT CONTRAST TECHNIQUE: Multidetector CT imaging of the head and cervical spine was performed following the standard protocol without intravenous contrast. Multiplanar CT image reconstructions of the cervical spine were also generated. RADIATION DOSE REDUCTION: This exam was performed according to the departmental dose-optimization program which includes automated exposure control, adjustment of the mA and/or kV according to patient size and/or use of iterative reconstruction technique. COMPARISON:  08/09/2021 FINDINGS: CT HEAD FINDINGS Brain: No evidence of acute infarction, hemorrhage, hydrocephalus, extra-axial collection or mass lesion/mass effect. Periventricular and deep white matter hypodensity. Unchanged encephalomalacia of the medial left occipital lobe (series 3, image 9). Vascular: No hyperdense vessel or unexpected calcification. Skull: Normal. Negative for fracture or focal lesion. Sinuses/Orbits: No acute finding. Other: None. CT CERVICAL SPINE FINDINGS Alignment: Normal. Skull base and vertebrae: No acute fracture. No primary bone lesion or focal pathologic process. Soft tissues and spinal canal: No prevertebral fluid or swelling. No visible canal hematoma. Disc levels: Moderate multilevel disc space height loss and osteophytosis. Upper chest: Negative. Other: None. IMPRESSION: 1. No acute intracranial pathology. Small-vessel white matter disease and unchanged encephalomalacia of the medial  left occipital lobe. 2. No fracture or static subluxation of the cervical spine. 3. Moderate multilevel cervical disc degenerative disease. Electronically Signed   By: Jearld Lesch M.D.   On: 11/09/2022 17:28    Procedures Procedures    Medications Ordered in ED Medications  sodium chloride flush (NS) 0.9 % injection 3 mL (has no administration in time range)  acetaminophen (TYLENOL) tablet 650 mg (has no administration in time range)    Or  acetaminophen (TYLENOL) suppository 650 mg (has no administration in time range)  0.9 % NaCl with KCl 20 mEq/ L  infusion (has no administration in time range)  ondansetron (ZOFRAN) tablet 4 mg (has no administration in time range)    Or  ondansetron (ZOFRAN) injection 4  mg (has no administration in time range)  senna-docusate (Senokot-S) tablet 1 tablet (has no administration in time range)  lactated ringers bolus 1,000 mL (0 mLs Intravenous Stopped 11/09/22 1830)  magnesium sulfate IVPB 2 g 50 mL (0 g Intravenous Stopped 11/09/22 1920)  lactated ringers bolus 1,000 mL (0 mLs Intravenous Stopped 11/09/22 2255)    ED Course/ Medical Decision Making/ A&P Clinical Course as of 11/09/22 2334  Thu Nov 09, 2022  1627 EKG normal sinus rhythm, rate 54, intervals are unremarkable, I do not see any acute ST or T wave abnormality to suggest ischemia. [JD]    Clinical Course User Index [JD] Fulton Reek, MD                             Medical Decision Making Amount and/or Complexity of Data Reviewed Labs: ordered. Radiology: ordered.  Risk Prescription drug management. Decision regarding hospitalization.   88 year old male presenting for episode of altered mental status and possible fall.  On my evaluation he is awake, alert, no focal deficits or sign of CVA.  Given possible fall will obtain CT scan of the head and neck.  Consider possible syncope, seizure given apparent loss of bladder function.  No history of the same.  Denies any recent illness.   Family bedside reports she is otherwise been doing well has not had any issues.  Patient does not remember falling or why he was on the ground.  Primary [place]ick workup initiated.  CBC, CMP are unremarkable.  Magnesium is slightly low we will replete.  Troponin is normal.  Initial lactic acid mildly elevated, will give IV fluids and recheck.  Consider possible seizure as etiology.  Blood gas without acidosis.  On reevaluation he is completely asymptomatic.  He feels well.  However lactic acid continues to uptrend.  He is he medically stable, no signs of infectious.  He said he is afebrile.  He has no abdominal pain or signs or symptoms of mesenteric ischemia.  He has chart history of metformin, but states he is not taking this.  No other clear cause for his lactic acidosis, although has no anion gap and normal pH on blood gas.  However given unclear cause of his syncope/altered mental status with elevated lactic acid I discussed the patient with the hospitalist service and he was admitted for further management.        Final Clinical Impression(s) / ED Diagnoses Final diagnoses:  None    Rx / DC Orders ED Discharge Orders     None         Fulton Reek, MD 11/09/22 2952    Lorre Nick, MD 11/10/22 [phone]

## 2022-11-09 NOTE — ED Provider Notes (Signed)
I saw and evaluated the patient, reviewed the resident's note and I agree with the findings and plan.   88 year old male presents to being found unresponsive.  History of being on Eliquis.  EMS states the patient back to his baseline now.  CBG was 200.  No focal deficits on exam.  Patient had head CT and blood work EKG and reassess   Lorre Nick, MD 11/09/22 [phone]

## 2022-11-09 NOTE — H&P (Signed)
History and Physical    Jeffrey Rojas ZOX:[id number] DOB: 1934-12-28 DOA: 11/09/2022  PCP: Gracelyn Nurse, MD  Patient coming from: Home  I have personally briefly reviewed patient's old medical records in [facility]  Chief Complaint: Fall at home  HPI: Jeffrey Rojas is a 88 y.o. male with medical history significant for PAF on Eliquis, history of CVA, T2DM, HTN, HLD, prostate cancer who presented to the ED for evaluation after a fall at home.  History is limited from patient as he does not recall the events prior to arrival.  Family at bedside to supplement.  Patient lives alone.  He apparently has an unsteady gait at baseline, uses a cane, but does frequently trip and lose balance.  He says he is does not routinely use a cane in his home.  Patient was found down on the floor in his house by his neighbor today after unknown downtime.  Patient does not remember falling or anything prior to arriving to the hospital.  Per ED triage documentation patient was reportedly initially unresponsive when neighbor found him.  He was disoriented x 4 with EMS but slowly became fully oriented on route to the ED.  Patient otherwise denies any fevers, chills, diaphoresis, chest pain, dyspnea, nausea, vomiting, abdominal pain, dysuria, diarrhea.  He denies any new weakness in his extremities.  ED Course  Labs/Imaging on admission: I have personally reviewed following labs and imaging studies.  Initial vitals showed BP 155/74, pulse 82, RR 12, temp 96.2 F, SpO2 100% on room air.  Labs show sodium 140, potassium 4.6, magnesium 1.6, bicarb 27, BUN 12, creatinine 1.13, serum glucose 129, LFTs within normal limits, CK 83, WBC 6.2, hemoglobin 15.4, platelets 282,000.  Troponin 8 > 12.  Lactic acid 2.6 > 3.2 > 4.8.  Urinalysis and UDS pending.  CT head without contrast negative for acute intracranial pathology.  Small vessel white matter disease and unchanged encephalomalacia of the medial left supra lobe  noted.  CT cervical spine without contrast negative for fracture or static subluxation.  Portable chest x-ray negative for focal consolidation, edema, effusion.  Patient was given IV magnesium 2 g and 2 L LR.  The hospitalist service was consulted to admit for further evaluation and management.  Review of Systems: All systems reviewed and are negative except as documented in history of present illness above.   Past Medical History:  Diagnosis Date   Arthritis    Cancer [facility])    History of prostate cancer    Stroke (HCC) 07/2017    Past Surgical History:  Procedure Laterality Date   LOOP RECORDER INSERTION N/A 07/11/2017   Procedure: LOOP RECORDER INSERTION;  Surgeon: Regan Lemming, MD;  Location: [facility] INVASIVE CV LAB;  Service: Cardiovascular;  Laterality: N/A;   NO PAST SURGERIES     TEE WITHOUT CARDIOVERSION N/A 07/11/2017   Procedure: TRANSESOPHAGEAL ECHOCARDIOGRAM (TEE);  Surgeon: Elease Hashimoto Deloris Ping, MD;  Location: [facility] ENDOSCOPY;  Service: Cardiovascular;  Laterality: N/A;    Social History:  reports that he has never smoked. He has never used smokeless tobacco. He reports current alcohol use. He reports that he does not use drugs.  No Known Allergies  Family History  Problem Relation Age of Onset   Prostate cancer Neg Hx    Kidney cancer Neg Hx      Prior to Admission medications   Medication Sig Start Date End Date Taking? Authorizing Provider  amLODipine (NORVASC) 10 MG tablet Take 1 tablet (10 mg total)  by mouth daily. 12/13/21   Duke Salvia, MD  apixaban (ELIQUIS) 2.5 MG TABS tablet Take 1 tablet (2.5 mg total) by mouth 2 (two) times daily. 12/13/21   Duke Salvia, MD  atorvastatin (LIPITOR) 20 MG tablet Take 1 tablet (20 mg total) by mouth daily at 6 PM. 09/12/17   Ihor Austin, NP  citalopram (CELEXA) 20 MG tablet Take 20 mg by mouth daily.  08/30/17   [provider]  docusate sodium (COLACE) 100 MG capsule Take 200 mg by mouth 2 (two) times  daily.    [provider]  ELIQUIS 5 MG TABS tablet Take 1 tablet by mouth twice daily 06/16/22   Duke Salvia, MD  Ferrous Sulfate (IRON) 325 (65 Fe) MG TABS Take 325 mg by mouth daily.     [provider]  fluticasone (FLONASE) 50 MCG/ACT nasal spray Place 1 spray into both nostrils daily. 06/02/21   [provider]  hydrochlorothiazide (HYDRODIURIL) 25 MG tablet Take 25 mg by mouth daily.    [provider]  levocetirizine (XYZAL) 5 MG tablet Take 1 tablet by mouth at bedtime. 06/02/21   [provider]  metFORMIN (GLUCOPHAGE) 1000 MG tablet Take 1 tablet by mouth 2 (two) times daily with a meal. 10/07/20   [provider]  metFORMIN (GLUCOPHAGE) 500 MG tablet Take 1 tablet (500 mg) by mouth twice daily    [provider]  nitroGLYCERIN (NITROSTAT) 0.4 MG SL tablet Place 1 tablet (0.4 mg total) under the tongue every 5 (five) minutes as needed for chest pain (notify PCP if experiencing chest pain and need to take Nitroglycerin). 07/11/17   Beryl Meager, NP  venlafaxine XR (EFFEXOR-XR) 150 MG 24 hr capsule Take 150 mg by mouth daily. 04/20/21   [provider]    Physical Exam: Vitals:   11/09/22 2215 11/09/22 2245 11/09/22 2300 11/09/22 2325  BP: 125/74 118/72 124/70 132/74  Pulse: 84 75 (!) 58 65  Resp: 12 15 14 18   Temp:    98 F (36.7 C)  TempSrc:    Oral  SpO2: 100% 100% 100% 98%  Weight:    53.5 kg  Height:       Constitutional: Resting in bed, NAD, calm, comfortable Eyes: EOMI, lids and conjunctivae normal ENMT: Mucous membranes are moist. Posterior pharynx clear of any exudate or lesions.Normal dentition.  Neck: normal, supple, no masses. Respiratory: clear to auscultation bilaterally, no wheezing, no crackles. Normal respiratory effort. No accessory muscle use.  Cardiovascular: Regular rate and rhythm, no murmurs / rubs / gallops. No extremity edema. 2+ pedal pulses. Abdomen: no tenderness, no masses  palpated.  Musculoskeletal: no clubbing / cyanosis. No joint deformity upper and lower extremities. Good ROM, no contractures. Normal muscle tone.  Skin: no rashes, lesions, ulcers. No induration Neurologic: CN 2-12 grossly intact. Sensation intact. Strength 5/5 in all 4.  Psychiatric: Alert and oriented to person, place, year, but not situation.  EKG: Personally reviewed. Sinus rhythm, rate 54, no acute ischemic changes.  Rate is slower when compared to prior.  Assessment/Plan Principal Problem:   Syncope Active Problems:   Lactic acidosis   HLD (hyperlipidemia)   Essential hypertension   PAF (paroxysmal atrial fibrillation) (HCC)   Hypomagnesemia   History of CVA (cerebrovascular accident)   Type 2 diabetes mellitus (HCC)   Depression   Jeffrey Rojas is a 88 y.o. male with medical history significant for PAF on Eliquis, history of CVA, T2DM, HTN, HLD, prostate cancer  who is admitted for evaluation of suspected syncope and fall at home.  Assessment and Plan: Syncope with presumed fall at home: Patient does not remember falling at home.  Presumed syncope with unknown downtime.  CT head and neck reassuring.  Differential includes orthostatic hypotension, bradycardia or other cardiogenic syncope, potentially seizure with elevated lactic acid and reported confusion.  Noted to have mild aortic stenosis in 2019.  No focal deficits on admission. -Admit to telemetry -Obtain orthostatic vitals -Continue IV fluid hydration -Obtain echocardiogram -Holding antihypertensives for now -PT/OT eval  Lactic acidosis: Lactic acid 2.6 > 3.2 > 4.8.  Second liter of fluids given after third lab collected.  Lactic acidosis may be due to hypoperfusion from possible hypotensive episode and/or metformin use or possible seizure. -Continue IV fluid hydration -Hold metformin -Trend lactic acid level  Hypomagnesemia: IV supplementation given.  Repeat labs in AM.  Paroxysmal atrial fibrillation: In sinus  rhythm on admission with borderline bradycardia. -Continue Eliquis 2.5 mg twice daily -Not currently on rate/rhythm controlling meds  Hypertension: Holding antihypertensives as above.  Of note, his cardiologist wanted him to stop benazepril and continue amlodipine only due to orthostasis but per medication reconciliation looks like he is still taking both.  History of CVA: Continue Eliquis and atorvastatin.  Hyperlipidemia: Continue atorvastatin.  Depression: Continue Effexor XR and BuSpar.   DVT prophylaxis: apixaban (ELIQUIS) tablet 2.5 mg Start: 11/10/22 1000 apixaban (ELIQUIS) tablet 2.5 mg   Code Status: Full code Family Communication: Son-in-law at bedside Disposition Plan: From home, dispo pending clinical progress Consults called: None Severity of Illness: The appropriate patient status for this patient is OBSERVATION. Observation status is judged to be reasonable and necessary in order to provide the required intensity of service to ensure the patient's safety. The patient's presenting symptoms, physical exam findings, and initial radiographic and laboratory data in the context of their medical condition is felt to place them at decreased risk for further clinical deterioration. Furthermore, it is anticipated that the patient will be medically stable for discharge from the hospital within 2 midnights of admission.   Darreld Mclean MD [facility]  If 7PM-7AM, please contact night-coverage [email or web address]  11/09/2022, 11:39 PM

## 2022-11-09 NOTE — ED Notes (Signed)
Lactic 2.6. MD made aware.

## 2022-11-09 NOTE — ED Notes (Signed)
ED TO INPATIENT HANDOFF REPORT  ED Nurse Name and Phone #: Les Pou RN   S Name/Age/Gender Jeffrey Rojas 88 y.o. male Room/Bed: TRABC/TRABC  Code Status   Code Status: Full Code  Home/SNF/Other Home Patient oriented to: self, place, time, and situation Is this baseline? Yes   Triage Complete: Triage complete  Chief Complaint Syncope [R55]  Triage Note Pt BIB [facility] from home asa level 2 fall trauma. Pt was found on the floor by neighbor. Pt was initially disoriented  x4 w EMS, then slowly back to A&O X4 en-route w EMS. P[t reported he only remembers going to sleep and does not remember what happened afterwards.   BP 190/110   Allergies No Known Allergies  Level of Care/Admitting Diagnosis ED Disposition     ED Disposition  Admit   Condition  --   Comment  Hospital Area: [facility] [[id number]]  Level of Care: Telemetry Cardiac [103]  May place patient in observation at [facility] or [facility] if equivalent level of care is available:: No  Covid Evaluation: Asymptomatic - no recent exposure (last 10 days) testing not required  Diagnosis: Syncope [206001]  Admitting Physician: Charlsie Quest [[id number]]  Attending Physician: Charlsie Quest [[id number]]          B Medical/Surgery History Past Medical History:  Diagnosis Date   Arthritis    Cancer (HCC)    History of prostate cancer    Stroke (HCC) 07/2017   Past Surgical History:  Procedure Laterality Date   LOOP RECORDER INSERTION N/A 07/11/2017   Procedure: LOOP RECORDER INSERTION;  Surgeon: Regan Lemming, MD;  Location: [facility] INVASIVE CV LAB;  Service: Cardiovascular;  Laterality: N/A;   NO PAST SURGERIES     TEE WITHOUT CARDIOVERSION N/A 07/11/2017   Procedure: TRANSESOPHAGEAL ECHOCARDIOGRAM (TEE);  Surgeon: Elease Hashimoto Deloris Ping, MD;  Location: [facility] ENDOSCOPY;  Service: Cardiovascular;  Laterality: N/A;     A IV Location/Drains/Wounds Patient Lines/Drains/Airways Status     Active  Line/Drains/Airways     Name Placement date Placement time Site Days   Peripheral IV 11/09/22 20 G Anterior;Left Forearm 11/09/22  --  Forearm  less than 1            Intake/Output Last 24 hours  Intake/Output Summary (Last 24 hours) at 11/09/2022 2256 Last data filed at 11/09/2022 2255 Gross per 24 hour  Intake 1000 ml  Output --  Net 1000 ml    Labs/Imaging Results for orders placed or performed during the hospital encounter of 11/09/22 (from the past 48 hour(s))  CBC with Differential     Status: None   Collection Time: 11/09/22  4:16 PM  Result Value Ref Range   WBC 6.2 4.0 - 10.5 K/uL   RBC 5.49 4.22 - 5.81 MIL/uL   Hemoglobin 15.4 13.0 - 17.0 g/dL   HCT 32.2 02.5 - 42.7 %   MCV 88.2 80.0 - 100.0 fL   MCH 28.1 26.0 - 34.0 pg   MCHC 31.8 30.0 - 36.0 g/dL   RDW 06.2 37.6 - 28.3 %   Platelets 282 150 - 400 K/uL   nRBC 0.0 0.0 - 0.2 %   Neutrophils Relative % 47 %   Neutro Abs 2.9 1.7 - 7.7 K/uL   Lymphocytes Relative 45 %   Lymphs Abs 2.8 0.7 - 4.0 K/uL   Monocytes Relative 8 %   Monocytes Absolute 0.5 0.1 - 1.0 K/uL   Eosinophils Relative 0 %   Eosinophils Absolute  0.0 0.0 - 0.5 K/uL   Basophils Relative 0 %   Basophils Absolute 0.0 0.0 - 0.1 K/uL   Immature Granulocytes 0 %   Abs Immature Granulocytes 0.01 0.00 - 0.07 K/uL    Comment: Performed at [facility] Lab, [address], [place], [place] [address]  Comprehensive metabolic panel     Status: Abnormal   Collection Time: 11/09/22  4:16 PM  Result Value Ref Range   Sodium 140 135 - 145 mmol/L   Potassium 4.6 3.5 - 5.1 mmol/L   Chloride 101 98 - 111 mmol/L   CO2 27 22 - 32 mmol/L   Glucose, Bld 129 (H) 70 - 99 mg/dL    Comment: Glucose reference range applies only to samples taken after fasting for at least 8 hours.   BUN 12 8 - 23 mg/dL   Creatinine, Ser 4.09 0.61 - 1.24 mg/dL   Calcium 9.8 8.9 - 81.1 mg/dL   Total Protein 7.8 6.5 - 8.1 g/dL   Albumin 4.0 3.5 - 5.0 g/dL   AST 20 15 - 41 U/L   ALT  18 0 - 44 U/L   Alkaline Phosphatase 110 38 - 126 U/L   Total Bilirubin 0.8 0.3 - 1.2 mg/dL   GFR, Estimated >91 >47 mL/min    Comment: (NOTE) Calculated using the CKD-EPI Creatinine Equation (2021)    Anion gap 12 5 - 15    Comment: Performed at [facility], [address], [place], [place] [address]  Magnesium     Status: Abnormal   Collection Time: 11/09/22  4:16 PM  Result Value Ref Range   Magnesium 1.6 (L) 1.7 - 2.4 mg/dL    Comment: Performed at [facility], [address], [place], [place] [address]  Troponin I (High Sensitivity)     Status: None   Collection Time: 11/09/22  4:16 PM  Result Value Ref Range   Troponin I (High Sensitivity) 8 <18 ng/L    Comment: (NOTE) Elevated high sensitivity troponin I (hsTnI) values and significant  changes across serial measurements may suggest ACS but many other  chronic and acute conditions are known to elevate hsTnI results.  Refer to the "Links" section for chest pain algorithms and additional  guidance. Performed at [facility], [address], [place], [place] [address]   Lactic acid, plasma     Status: Abnormal   Collection Time: 11/09/22  4:28 PM  Result Value Ref Range   Lactic Acid, Venous 2.6 (HH) 0.5 - 1.9 mmol/L    Comment: CRITICAL RESULT CALLED TO, READ BACK BY AND VERIFIED WITH Reine Just RN 11/09/2022 1728 BNUNNERY Performed at [facility] Lab, [address] [place], [place] [address]   I-Stat venous blood gas, [facility], [facility], [facility])     Status: Abnormal   Collection Time: 11/09/22  4:34 PM  Result Value Ref Range   pH, Ven 7.343 7.25 - 7.43   pCO2, Ven 59.0 44 - 60 mmHg   pO2, Ven 22 (LL) 32 - 45 mmHg   Bicarbonate 32.1 (H) 20.0 - 28.0 mmol/L   TCO2 34 (H) 22 - 32 mmol/L   O2 Saturation 32 %   Acid-Base Excess 4.0 (H) 0.0 - 2.0 mmol/L   Sodium 141 135 - 145 mmol/L   Potassium 4.5 3.5 - 5.1 mmol/L   Calcium, Ion 1.19 1.15 - 1.40 mmol/L   HCT 49.0 39.0 - 52.0 %   Hemoglobin 16.7 13.0 - 17.0  g/dL   Sample type VENOUS    Comment NOTIFIED PHYSICIAN   CK     Status: None   Collection Time: 11/09/22  4:46 PM  Result Value Ref Range   Total CK 83 49 - 397 U/L    Comment: Performed at [facility] Lab, [address] [place], [place] [address]  Lactic acid, plasma     Status: Abnormal   Collection Time: 11/09/22  6:14 PM  Result Value Ref Range   Lactic Acid, Venous 3.2 (HH) 0.5 - 1.9 mmol/L    Comment: CRITICAL VALUE NOTED. VALUE IS CONSISTENT WITH PREVIOUSLY REPORTED/CALLED VALUE Performed at [facility] Lab, [address], [place], [place] [address]   Troponin I (High Sensitivity)     Status: None   Collection Time: 11/09/22  6:26 PM  Result Value Ref Range   Troponin I (High Sensitivity) 12 <18 ng/L    Comment: (NOTE) Elevated high sensitivity troponin I (hsTnI) values and significant  changes across serial measurements may suggest ACS but many other  chronic and acute conditions are known to elevate hsTnI results.  Refer to the "Links" section for chest pain algorithms and additional  guidance. Performed at [facility] Lab, [address], [place], [place] [address]   Lactic acid, plasma     Status: Abnormal   Collection Time: 11/09/22  8:30 PM  Result Value Ref Range   Lactic Acid, Venous 4.8 (HH) 0.5 - 1.9 mmol/L    Comment: CRITICAL VALUE NOTED. VALUE IS CONSISTENT WITH PREVIOUSLY REPORTED/CALLED VALUE Performed at [facility] Lab, [address], [place], [place] [address]   Urinalysis, Routine w reflex microscopic -Urine, Clean Catch     Status: Abnormal   Collection Time: 11/09/22  9:42 PM  Result Value Ref Range   Color, Urine YELLOW YELLOW   APPearance CLEAR CLEAR   Specific Gravity, Urine 1.014 1.005 - 1.030   pH 7.0 5.0 - 8.0   Glucose, UA 150 (A) NEGATIVE mg/dL   Hgb urine dipstick NEGATIVE NEGATIVE   Bilirubin Urine NEGATIVE NEGATIVE   Ketones, ur 5 (A) NEGATIVE mg/dL   Protein, ur NEGATIVE NEGATIVE mg/dL   Nitrite NEGATIVE NEGATIVE    Leukocytes,Ua NEGATIVE NEGATIVE    Comment: Performed at [facility] Lab, [address] [place], [place] [address]  Rapid urine drug screen (hospital performed)     Status: None   Collection Time: 11/09/22  9:45 PM  Result Value Ref Range   Opiates NONE DETECTED NONE DETECTED   Cocaine NONE DETECTED NONE DETECTED   Benzodiazepines NONE DETECTED NONE DETECTED   Amphetamines NONE DETECTED NONE DETECTED   Tetrahydrocannabinol NONE DETECTED NONE DETECTED   Barbiturates NONE DETECTED NONE DETECTED    Comment: (NOTE) DRUG SCREEN FOR MEDICAL PURPOSES ONLY.  IF CONFIRMATION IS NEEDED FOR ANY PURPOSE, NOTIFY LAB WITHIN 5 DAYS.  LOWEST DETECTABLE LIMITS FOR URINE DRUG SCREEN Drug Class                     Cutoff (ng/mL) Amphetamine and metabolites    1000 Barbiturate and metabolites    200 Benzodiazepine                 200 Opiates and metabolites        300 Cocaine and metabolites        300 THC  50 Performed at [facility] Lab, [address] [place], [place] [address]    DG CHEST PORT 1 VIEW  Result Date: 11/09/2022 CLINICAL DATA:  Larey Seat, altered level of consciousness EXAM: PORTABLE CHEST 1 VIEW COMPARISON:  06/15/2021 FINDINGS: Single frontal view of the chest demonstrates an unremarkable cardiac silhouette. Stable ectasia and atherosclerosis of the thoracic aorta. No acute airspace disease, effusion, or pneumothorax. No acute bony abnormality. IMPRESSION: 1. No acute intrathoracic process. Electronically Signed   By: Sharlet Salina M.D.   On: 11/09/2022 17:46   CT Head Wo Contrast  Result Date: 11/09/2022 CLINICAL DATA:  Possible fall, found down, unwitnessed trauma EXAM: CT HEAD WITHOUT CONTRAST CT CERVICAL SPINE WITHOUT CONTRAST TECHNIQUE: Multidetector CT imaging of the head and cervical spine was performed following the standard protocol without intravenous contrast. Multiplanar CT image reconstructions of the cervical spine were also generated.  RADIATION DOSE REDUCTION: This exam was performed according to the departmental dose-optimization program which includes automated exposure control, adjustment of the mA and/or kV according to patient size and/or use of iterative reconstruction technique. COMPARISON:  08/09/2021 FINDINGS: CT HEAD FINDINGS Brain: No evidence of acute infarction, hemorrhage, hydrocephalus, extra-axial collection or mass lesion/mass effect. Periventricular and deep white matter hypodensity. Unchanged encephalomalacia of the medial left occipital lobe (series 3, image 9). Vascular: No hyperdense vessel or unexpected calcification. Skull: Normal. Negative for fracture or focal lesion. Sinuses/Orbits: No acute finding. Other: None. CT CERVICAL SPINE FINDINGS Alignment: Normal. Skull base and vertebrae: No acute fracture. No primary bone lesion or focal pathologic process. Soft tissues and spinal canal: No prevertebral fluid or swelling. No visible canal hematoma. Disc levels: Moderate multilevel disc space height loss and osteophytosis. Upper chest: Negative. Other: None. IMPRESSION: 1. No acute intracranial pathology. Small-vessel white matter disease and unchanged encephalomalacia of the medial left occipital lobe. 2. No fracture or static subluxation of the cervical spine. 3. Moderate multilevel cervical disc degenerative disease. Electronically Signed   By: Jearld Lesch M.D.   On: 11/09/2022 17:28   CT Cervical Spine Wo Contrast  Result Date: 11/09/2022 CLINICAL DATA:  Possible fall, found down, unwitnessed trauma EXAM: CT HEAD WITHOUT CONTRAST CT CERVICAL SPINE WITHOUT CONTRAST TECHNIQUE: Multidetector CT imaging of the head and cervical spine was performed following the standard protocol without intravenous contrast. Multiplanar CT image reconstructions of the cervical spine were also generated. RADIATION DOSE REDUCTION: This exam was performed according to the departmental dose-optimization program which includes automated  exposure control, adjustment of the mA and/or kV according to patient size and/or use of iterative reconstruction technique. COMPARISON:  08/09/2021 FINDINGS: CT HEAD FINDINGS Brain: No evidence of acute infarction, hemorrhage, hydrocephalus, extra-axial collection or mass lesion/mass effect. Periventricular and deep white matter hypodensity. Unchanged encephalomalacia of the medial left occipital lobe (series 3, image 9). Vascular: No hyperdense vessel or unexpected calcification. Skull: Normal. Negative for fracture or focal lesion. Sinuses/Orbits: No acute finding. Other: None. CT CERVICAL SPINE FINDINGS Alignment: Normal. Skull base and vertebrae: No acute fracture. No primary bone lesion or focal pathologic process. Soft tissues and spinal canal: No prevertebral fluid or swelling. No visible canal hematoma. Disc levels: Moderate multilevel disc space height loss and osteophytosis. Upper chest: Negative. Other: None. IMPRESSION: 1. No acute intracranial pathology. Small-vessel white matter disease and unchanged encephalomalacia of the medial left occipital lobe. 2. No fracture or static subluxation of the cervical spine. 3. Moderate multilevel cervical disc degenerative disease. Electronically Signed   By: Jearld Lesch M.D.   On:  11/09/2022 17:28    Pending Labs Unresulted Labs (From admission, onward)     Start     Ordered   11/10/22 0500  Magnesium  Tomorrow morning,   R        11/09/22 2222   11/10/22 0500  CBC  Tomorrow morning,   R        11/09/22 2222   11/10/22 0500  Basic metabolic panel  Tomorrow morning,   R        11/09/22 2222   11/10/22 0200  Lactic acid, plasma  Now then every 3 hours,   R (with TIMED occurrences)      11/09/22 2227            Vitals/Pain Today's Vitals   11/09/22 2000 11/09/22 2036 11/09/22 2045 11/09/22 2145  BP: 114/67  (!) 148/103   Pulse: 84     Resp: 17  14   Temp:    (!) 97.4 F (36.3 C)  TempSrc:    Rectal  SpO2: 100%     Weight:       Height:      PainSc:  0-No pain      Isolation Precautions No active isolations  Medications Medications  sodium chloride flush (NS) 0.9 % injection 3 mL (has no administration in time range)  acetaminophen (TYLENOL) tablet 650 mg (has no administration in time range)    Or  acetaminophen (TYLENOL) suppository 650 mg (has no administration in time range)  0.9 % NaCl with KCl 20 mEq/ L  infusion (has no administration in time range)  ondansetron (ZOFRAN) tablet 4 mg (has no administration in time range)    Or  ondansetron (ZOFRAN) injection 4 mg (has no administration in time range)  senna-docusate (Senokot-S) tablet 1 tablet (has no administration in time range)  lactated ringers bolus 1,000 mL (0 mLs Intravenous Stopped 11/09/22 1830)  magnesium sulfate IVPB 2 g 50 mL (0 g Intravenous Stopped 11/09/22 1920)  lactated ringers bolus 1,000 mL (0 mLs Intravenous Stopped 11/09/22 2255)    Mobility walks     Focused Assessments     R Recommendations: See Admitting Provider Note  Report given to:   Additional Notes:

## 2022-11-10 ENCOUNTER — Observation Stay (HOSPITAL_BASED_OUTPATIENT_CLINIC_OR_DEPARTMENT_OTHER): Payer: Medicare Other

## 2022-11-10 ENCOUNTER — Observation Stay (HOSPITAL_COMMUNITY): Payer: Medicare Other

## 2022-11-10 DIAGNOSIS — R55 Syncope and collapse: Secondary | ICD-10-CM

## 2022-11-10 DIAGNOSIS — R569 Unspecified convulsions: Secondary | ICD-10-CM | POA: Diagnosis not present

## 2022-11-10 DIAGNOSIS — R4182 Altered mental status, unspecified: Secondary | ICD-10-CM

## 2022-11-10 LAB — ECHOCARDIOGRAM COMPLETE
AR max vel: 3.44 cm2
AV Area VTI: 3.05 cm2
AV Area mean vel: 3.08 cm2
AV Mean grad: 3 mmHg
AV Peak grad: 5.5 mmHg
AV Vena cont: 0.5 cm
Ao pk vel: 1.17 m/s
Area-P 1/2: 2.87 cm2
Height: 69 in
P 1/2 time: 952 msec
S' Lateral: 1.9 cm
Weight: 1901.25 oz

## 2022-11-10 LAB — GLUCOSE, CAPILLARY: Glucose-Capillary: 94 mg/dL (ref 70–99)

## 2022-11-10 LAB — BASIC METABOLIC PANEL
Anion gap: 6 (ref 5–15)
BUN: 12 mg/dL (ref 8–23)
CO2: 30 mmol/L (ref 22–32)
Calcium: 9.1 mg/dL (ref 8.9–10.3)
Chloride: 103 mmol/L (ref 98–111)
Creatinine, Ser: 1.08 mg/dL (ref 0.61–1.24)
GFR, Estimated: >60 mL/min (ref >60)
Glucose, Bld: 202 mg/dL — ABNORMAL HIGH (ref 70–99)
Potassium: 3.6 mmol/L (ref 3.5–5.1)
Sodium: 139 mmol/L (ref 135–145)

## 2022-11-10 LAB — LACTIC ACID, PLASMA
Lactic Acid, Venous: 3.3 mmol/L (ref 0.5–1.9)
Lactic Acid, Venous: 2.4 mmol/L (ref 0.5–1.9)

## 2022-11-10 LAB — LIPASE, BLOOD: Lipase: 41 U/L (ref 11–51)

## 2022-11-10 LAB — CBC
HCT: 38.9 % — ABNORMAL LOW (ref 39.0–52.0)
Hemoglobin: 13.2 g/dL (ref 13.0–17.0)
MCH: 28.9 pg (ref 26.0–34.0)
MCHC: 33.9 g/dL (ref 30.0–36.0)
MCV: 85.1 fL (ref 80.0–100.0)
Platelets: 233 10*3/uL (ref 150–400)
RBC: 4.57 MIL/uL (ref 4.22–5.81)
RDW: 13.7 % (ref 11.5–15.5)
WBC: 6.1 10*3/uL (ref 4.0–10.5)
nRBC: 0 % (ref 0.0–0.2)

## 2022-11-10 LAB — HEPATIC FUNCTION PANEL
ALT: 20 U/L (ref 0–44)
AST: 31 U/L (ref 15–41)
Albumin: 3 g/dL — ABNORMAL LOW (ref 3.5–5.0)
Alkaline Phosphatase: 84 U/L (ref 38–126)
Bilirubin, Direct: <0.1 mg/dL (ref 0.0–0.2)
Total Bilirubin: 0.5 mg/dL (ref 0.3–1.2)
Total Protein: 5.9 g/dL — ABNORMAL LOW (ref 6.5–8.1)

## 2022-11-10 LAB — MAGNESIUM: Magnesium: 1.8 mg/dL (ref 1.7–2.4)

## 2022-11-10 MED ORDER — POTASSIUM CHLORIDE CRYS ER 20 MEQ PO TBCR
40.0000 meq | EXTENDED_RELEASE_TABLET | Freq: Once | ORAL | Status: AC
  Administered 2022-11-10: 40 meq via ORAL
  Filled 2022-11-10: qty 2

## 2022-11-10 NOTE — Procedures (Signed)
Patient Name: Jeffrey Rojas  MRN: [id number]  Epilepsy Attending: Charlsie Quest  Referring Physician/Provider: Osvaldo Shipper, MD  Date: 11/10/2022 Duration: 23.14 mins  Patient history: 88yo M with syncope getting eeg to evaluate for seizure  Level of alertness: Awake, asleep  AEDs during EEG study: None  Technical aspects: This EEG study was done with scalp electrodes positioned according to the 10-20 International system of electrode placement. Electrical activity was reviewed with band pass filter of 1-70Hz , sensitivity of 7 uV/mm, display speed of 16mm/sec with a 60Hz  notched filter applied as appropriate. EEG data were recorded continuously and digitally stored.  Video monitoring was available and reviewed as appropriate.  Description: The posterior dominant rhythm consists of 7.5 Hz activity of moderate voltage (25-35 uV) seen predominantly in posterior head regions, symmetric and reactive to eye opening and eye closing. Sleep was characterized by vertex waves, sleep spindles (12 to 14 Hz), maximal frontocentral region. Hyperventilation and photic stimulation were not performed.     IMPRESSION: This study is within normal limits. No seizures or epileptiform discharges were seen throughout the recording.  A normal interictal EEG does not exclude the diagnosis of epilepsy.  Diarra Ceja Annabelle Harman

## 2022-11-10 NOTE — Evaluation (Signed)
Physical Therapy Evaluation Patient Details Name: Jeffrey Rojas MRN: [id number] DOB: [birth year] Today's Date: 11/10/2022  History of Present Illness  88 yo male s/p fall at home with syncope PMH PAF on eliquis, CVA, DM2, HTN, HLD, prostate CA  Clinical Impression  Pt presents to PT with slightly unsteady gait due to illness and inactivity. Expect pt will make good progress back to baseline with mobility. Will follow acutely but doubt pt will need PT after DC. Daughter plans to take pt home with her in [place]. Pt did better with rollator but did not want one for home.         Recommendations for follow up therapy are one component of a multi-disciplinary discharge planning process, led by the attending physician.  Recommendations may be updated based on patient status, additional functional criteria and insurance authorization.  Follow Up Recommendations       Assistance Recommended at Discharge Frequent or constant Supervision/Assistance  Patient can return home with the following  Help with stairs or ramp for entrance;A little help with bathing/dressing/bathroom;Direct supervision/assist for medications management;Assist for transportation;Direct supervision/assist for financial management    Equipment Recommendations None recommended by PT (Pt declined rollator)  Recommendations for Other Services       Functional Status Assessment Patient has had a recent decline in their functional status and/or demonstrates limited ability to make significant improvements in function in a reasonable and predictable amount of time     Precautions / Restrictions Precautions Precautions: Fall      Mobility  Bed Mobility Overal bed mobility: Needs Assistance Bed Mobility: Supine to Sit, Sit to Supine     Supine to sit: Supervision Sit to supine: Supervision   General bed mobility comments: supervision for lines    Transfers Overall transfer level: Needs assistance Equipment used:  Straight cane Transfers: Sit to/from Stand Sit to Stand: Min guard, From elevated surface           General transfer comment: Assist for safety. Bed elevated 2".    Ambulation/Gait Ambulation/Gait assistance: Min guard, Supervision Gait Distance (Feet): 200 Feet Assistive device: Straight cane, Rollator (4 wheels) Gait Pattern/deviations: Step-through pattern, Decreased stride length Gait velocity: decr Gait velocity interpretation: 1.31 - 2.62 ft/sec, indicative of limited community ambulator   General Gait Details: Min guard amb with cane with pt reaching with free hand for other objects. With rollator supervision only with more confident gait.  Stairs            Wheelchair Mobility    Modified Rankin (Stroke Patients Only)       Balance Overall balance assessment: Needs assistance Sitting-balance support: No upper extremity supported, Feet supported Sitting balance-Leahy Scale: Good     Standing balance support: Single extremity supported, No upper extremity supported Standing balance-Leahy Scale: Fair                               Pertinent Vitals/Pain Pain Assessment Pain Assessment: No/denies pain    Home Living Family/patient expects to be discharged to:: Private residence Living Arrangements: Children (Daughter is taking him to her house in [place]) Available Help at Discharge: Family;Available PRN/intermittently             Home Equipment: Gilmer Mor - single point      Prior Function Prior Level of Function : Independent/Modified Independent             Mobility Comments: Uses cane  Hand Dominance        Extremity/Trunk Assessment   Upper Extremity Assessment Upper Extremity Assessment: Defer to OT evaluation    Lower Extremity Assessment Lower Extremity Assessment: Generalized weakness       Communication   Communication: No difficulties  Cognition Arousal/Alertness: Awake/alert Behavior During  Therapy: Flat affect Overall Cognitive Status: No family/caregiver present to determine baseline cognitive functioning (daughter asleep in room) Area of Impairment: Safety/judgement, Memory, Problem solving                         Safety/Judgement: Decreased awareness of safety              General Comments General comments (skin integrity, edema, etc.): VSS on RA. Negative on orthostatic BP    Exercises     Assessment/Plan    PT Assessment Patient needs continued PT services  PT Problem List Decreased strength;Decreased balance;Decreased mobility       PT Treatment Interventions DME instruction;Gait training;Stair training;Functional mobility training;Therapeutic exercise;Therapeutic activities;Balance training;Patient/family education    PT Goals (Current goals can be found in the Care Plan section)  Acute Rehab PT Goals Patient Stated Goal: go home PT Goal Formulation: With patient Time For Goal Achievement: 11/17/22 Potential to Achieve Goals: Good    Frequency Min 1X/week     Co-evaluation               AM-PAC PT "6 Clicks" Mobility  Outcome Measure Help needed turning from your back to your side while in a flat bed without using bedrails?: None Help needed moving from lying on your back to sitting on the side of a flat bed without using bedrails?: None Help needed moving to and from a bed to a chair (including a wheelchair)?: A Little Help needed standing up from a chair using your arms (e.g., wheelchair or bedside chair)?: A Little Help needed to walk in hospital room?: A Little Help needed climbing 3-5 steps with a railing? : A Little 6 Click Score: 20    End of Session Equipment Utilized During Treatment: Gait belt Activity Tolerance: Patient tolerated treatment well Patient left: in bed;with call bell/phone within reach;with bed alarm set;with family/visitor present Nurse Communication: Mobility status PT Visit Diagnosis: Unsteadiness  on feet (R26.81);Muscle weakness (generalized) (M62.81)    Time: 1610-9604 PT Time Calculation (min) (ACUTE ONLY): 20 min   Charges:   PT Evaluation $PT Eval Moderate Complexity: 1 Mod          [facility] PT Acute Rehabilitation Services Office [phone]   Angelina Ok [facility] 11/10/2022, 1:29 PM

## 2022-11-10 NOTE — Progress Notes (Signed)
OT Cancellation Note  Patient Details Name: Jeffrey Rojas MRN: [id number] DOB: 09-13-1934   Cancelled Treatment:    Reason Eval/Treat Not Completed: OT screened, no needs identified, will sign off OT spoke with PT [place] and pt is close to baseline. Pt with (A) for family upon dc.   [name] 11/10/2022, 2:34 PM

## 2022-11-10 NOTE — Progress Notes (Signed)
PT Cancellation Note  Patient Details Name: Jeffrey Rojas MRN: [id number] DOB: [birth year]   Cancelled Treatment:    Reason Eval/Treat Not Completed: Other (comment). Pt wants to rest and eat his breakfast.   Angelina Ok [facility] 11/10/2022, 9:17 AM Skip Mayer PT Acute Rehabilitation Services Office [phone]

## 2022-11-10 NOTE — Plan of Care (Signed)

## 2022-11-10 NOTE — Plan of Care (Signed)

## 2022-11-10 NOTE — Plan of Care (Signed)
  Problem: Education: Goal: Knowledge of condition and prescribed therapy will improve 11/10/2022 1821 by Herma Carson, RN Outcome: Adequate for Discharge 11/10/2022 0805 by Herma Carson, RN Outcome: Progressing   Problem: Cardiac: Goal: Will achieve and/or maintain adequate cardiac output 11/10/2022 1821 by Herma Carson, RN Outcome: Adequate for Discharge 11/10/2022 0805 by Herma Carson, RN Outcome: Progressing   Problem: Physical Regulation: Goal: Complications related to the disease process, condition or treatment will be avoided or minimized 11/10/2022 1821 by Herma Carson, RN Outcome: Adequate for Discharge 11/10/2022 0805 by Herma Carson, RN Outcome: Progressing   Problem: Education: Goal: Knowledge of General Education information will improve Description: Including pain rating scale, medication(s)/side effects and non-pharmacologic comfort measures 11/10/2022 1821 by Herma Carson, RN Outcome: Adequate for Discharge 11/10/2022 0805 by Herma Carson, RN Outcome: Progressing   Problem: Health Behavior/Discharge Planning: Goal: Ability to manage health-related needs will improve 11/10/2022 1821 by Herma Carson, RN Outcome: Adequate for Discharge 11/10/2022 0805 by Herma Carson, RN Outcome: Progressing   Problem: Clinical Measurements: Goal: Ability to maintain clinical measurements within normal limits will improve 11/10/2022 1821 by Herma Carson, RN Outcome: Adequate for Discharge 11/10/2022 0805 by Herma Carson, RN Outcome: Progressing Goal: Will remain free from infection 11/10/2022 1821 by Herma Carson, RN Outcome: Adequate for Discharge 11/10/2022 0805 by Herma Carson, RN Outcome: Progressing Goal: Diagnostic test results will improve 11/10/2022 1821 by Herma Carson, RN Outcome: Adequate for Discharge 11/10/2022 0805 by Herma Carson, RN Outcome: Progressing Goal: Respiratory complications will improve 11/10/2022 1821 by  Herma Carson, RN Outcome: Adequate for Discharge 11/10/2022 0805 by Herma Carson, RN Outcome: Progressing Goal: Cardiovascular complication will be avoided 11/10/2022 1821 by Herma Carson, RN Outcome: Adequate for Discharge 11/10/2022 0805 by Herma Carson, RN Outcome: Progressing   Problem: Activity: Goal: Risk for activity intolerance will decrease 11/10/2022 1821 by Herma Carson, RN Outcome: Adequate for Discharge 11/10/2022 0805 by Herma Carson, RN Outcome: Progressing   Problem: Nutrition: Goal: Adequate nutrition will be maintained 11/10/2022 1821 by Herma Carson, RN Outcome: Adequate for Discharge 11/10/2022 0805 by Herma Carson, RN Outcome: Progressing   Problem: Coping: Goal: Level of anxiety will decrease 11/10/2022 1821 by Herma Carson, RN Outcome: Adequate for Discharge 11/10/2022 0805 by Herma Carson, RN Outcome: Progressing   Problem: Elimination: Goal: Will not experience complications related to bowel motility 11/10/2022 1821 by Herma Carson, RN Outcome: Adequate for Discharge 11/10/2022 0805 by Herma Carson, RN Outcome: Progressing Goal: Will not experience complications related to urinary retention 11/10/2022 1821 by Herma Carson, RN Outcome: Adequate for Discharge 11/10/2022 0805 by Herma Carson, RN Outcome: Progressing   Problem: Pain Managment: Goal: General experience of comfort will improve 11/10/2022 1821 by Herma Carson, RN Outcome: Adequate for Discharge 11/10/2022 0805 by Herma Carson, RN Outcome: Progressing   Problem: Safety: Goal: Ability to remain free from injury will improve 11/10/2022 1821 by Herma Carson, RN Outcome: Adequate for Discharge 11/10/2022 0805 by Herma Carson, RN Outcome: Progressing   Problem: Skin Integrity: Goal: Risk for impaired skin integrity will decrease 11/10/2022 1821 by Herma Carson, RN Outcome: Adequate for Discharge 11/10/2022 0805 by Herma Carson,  RN Outcome: Progressing

## 2022-11-10 NOTE — Discharge Summary (Signed)
[facility]  Physician Discharge Summary   Patient ID: Jeffrey Rojas MRN: [id number] DOB/AGE: March 29, 1935 88 y.o.  Admit date: 11/09/2022 Discharge date: 11/10/2022    PCP: Jeffrey Nurse, MD  DISCHARGE DIAGNOSES:    Syncope   Lactic acidosis   HLD (hyperlipidemia)   Essential hypertension   PAF (paroxysmal atrial fibrillation) (HCC)   Hypomagnesemia   History of CVA (cerebrovascular accident)   Type 2 diabetes mellitus (HCC)   Depression   RECOMMENDATIONS FOR OUTPATIENT FOLLOW UP: Patient instructed to follow-up with primary care provider   Home Health: None Equipment/Devices: None  CODE STATUS: Full code  DISCHARGE CONDITION: fair  Diet recommendation: As before  INITIAL HISTORY: 88 y.o. male with medical history significant for PAF on Eliquis, history of CVA, T2DM, HTN, HLD, prostate cancer who presented to the ED for evaluation after a fall at home.  History is limited from patient as he does not recall the events prior to arrival.  He apparently has an unsteady gait at baseline, uses a cane, but does frequently trip and lose balance.  He says he is does not routinely use a cane in his home. Patient was found down on the floor in his house by his neighbor today after unknown downtime.  Patient does not remember falling or anything prior to arriving to the hospital.  Per ED triage documentation patient was reportedly initially unresponsive when neighbor found him.  He was disoriented x 4 with EMS but slowly became fully oriented on route to the ED.   Consultants: None   Procedures: Echocardiogram and EEG   HOSPITAL COURSE:   Presumed syncope with fall at home Etiology unclear.  Orthostatic vital signs were unremarkable. Echocardiogram shows normal systolic function.  No significant valvular abnormalities noted.  Patient underwent EEG which did not show any seizure activity. CT of the head and neck was reassuring.  No focal neurological deficits noted. Seen  by physical therapy and he ambulated well without difficulty.  No recurrence of symptoms.  Etiology for his fall and presumed syncope is unclear at this time.  Results of his test were discussed with his granddaughter.  Encouraged to follow-up with PCP.   Lactic acidosis Etiology unclear.  No evidence for infection.  Could have been secondary to metformin in the setting of dehydration.  Lactic acid levels have improved.     Abdominal tenderness Abdominal films were unremarkable.  LFTs and lipase levels were normal.  Patient tolerated his meals without difficulty.     Hypomagnesemia Repleted.   Paroxysmal atrial fibrillation Continue with Eliquis.  Not noted to be on any rate limiting medications.   Essential hypertension Patient to continue only HCTZ for now.  Patient to check his blood pressures on a daily basis.   History of stroke Continue Eliquis and statin.     History of depression Continue home medications.  Patient is stable.  Workup has been completed and unremarkable for the most part.  Okay for discharge home today.   PERTINENT LABS:  The results of significant diagnostics from this hospitalization (including imaging, microbiology, ancillary and laboratory) are listed below for reference.     Labs:   Basic Metabolic Panel: Recent Labs  Lab 11/09/22 1616 11/09/22 1634 11/10/22 0233  NA 140 141 139  K 4.6 4.5 3.6  CL 101  --  103  CO2 27  --  30  GLUCOSE 129*  --  202*  BUN 12  --  12  CREATININE 1.13  --  1.08  CALCIUM 9.8  --  9.1  MG 1.6*  --  1.8   Liver Function Tests: Recent Labs  Lab 11/09/22 1616 11/10/22 0226  AST 20 31  ALT 18 20  ALKPHOS 110 84  BILITOT 0.8 0.5  PROT 7.8 5.9*  ALBUMIN 4.0 3.0*   Recent Labs  Lab 11/10/22 0226  LIPASE 41    CBC: Recent Labs  Lab 11/09/22 1616 11/09/22 1634 11/10/22 0233  WBC 6.2  --  6.1  NEUTROABS 2.9  --   --   HGB 15.4 16.7 13.2  HCT 48.4 49.0 38.9*  MCV 88.2  --  85.1  PLT 282  --   233   Cardiac Enzymes: Recent Labs  Lab 11/09/22 1646  CKTOTAL 83    CBG: Recent Labs  Lab 11/10/22 0618  GLUCAP 94     IMAGING STUDIES ECHOCARDIOGRAM COMPLETE  Result Date: 11/10/2022    ECHOCARDIOGRAM REPORT   Patient Name:   Jeffrey Rojas Date of Exam: 11/10/2022 Medical Rec #:  [id number]    Height:       69.0 in Accession #:    [id number]   Weight:       118.8 lb Date of Birth:  10/01/1934   BSA:          1.656 m Patient Age:    87 years     BP:           100/64 mmHg Patient Gender: M            HR:           61 bpm. Exam Location:  Inpatient Procedure: 2D Echo, Cardiac Doppler and Color Doppler Indications:     Syncope R55  History:         Patient has prior history of Echocardiogram examinations, most                  recent 07/20/2017. Stroke; Risk Factors:Hypertension,                  Dyslipidemia, Diabetes and Non-Smoker.  Sonographer:     Dondra Prader RVT RCS Referring Phys:  [id number] Jeffrey Rojas Diagnosing Phys: Jeffrey Lesches MD  Sonographer Comments: Technically challenging study due to limited acoustic windows. Image acquisition challenging due to respiratory motion. IMPRESSIONS  1. Left ventricular ejection fraction, by estimation, is 65 to 70%. The left ventricle has normal function. The left ventricle has no regional wall motion abnormalities. There is mild left ventricular hypertrophy. Left ventricular diastolic parameters are indeterminate.  2. Right ventricular systolic function is normal. The right ventricular size is normal. There is normal pulmonary artery systolic pressure. The estimated right ventricular systolic pressure is 21.5 mmHg.  3. The mitral valve is normal in structure. Trivial mitral valve regurgitation.  4. The aortic valve is tricuspid. Aortic valve regurgitation is mild to moderate. Aortic valve sclerosis/calcification is present, without any evidence of aortic stenosis.  5. The inferior vena cava is normal in size with greater than 50% respiratory  variability, suggesting right atrial pressure of 3 mmHg. FINDINGS  Left Ventricle: Left ventricular ejection fraction, by estimation, is 65 to 70%. The left ventricle has normal function. The left ventricle has no regional wall motion abnormalities. The left ventricular internal cavity size was normal in size. There is  mild left ventricular hypertrophy. Left ventricular diastolic parameters are indeterminate. Right Ventricle: The right ventricular size is normal. No increase in right ventricular wall thickness. Right ventricular systolic function is normal.  There is normal pulmonary artery systolic pressure. The tricuspid regurgitant velocity is 2.15 m/s, and  with an assumed right atrial pressure of 3 mmHg, the estimated right ventricular systolic pressure is 21.5 mmHg. Left Atrium: Left atrial size was normal in size. Right Atrium: Right atrial size was normal in size. Pericardium: There is no evidence of pericardial effusion. Mitral Valve: The mitral valve is normal in structure. Trivial mitral valve regurgitation. Tricuspid Valve: The tricuspid valve is normal in structure. Tricuspid valve regurgitation is mild. Aortic Valve: The aortic valve is tricuspid. Aortic valve regurgitation is mild to moderate. Aortic regurgitation PHT measures 952 msec. Aortic valve sclerosis/calcification is present, without any evidence of aortic stenosis. Aortic valve mean gradient measures 3.0 mmHg. Aortic valve peak gradient measures 5.5 mmHg. Aortic valve area, by VTI measures 3.05 cm. Pulmonic Valve: The pulmonic valve was not well visualized. Pulmonic valve regurgitation is not visualized. Aorta: The aortic root and ascending aorta are structurally normal, with no evidence of dilitation. Venous: The inferior vena cava is normal in size with greater than 50% respiratory variability, suggesting right atrial pressure of 3 mmHg. IAS/Shunts: The interatrial septum was not well visualized.  LEFT VENTRICLE PLAX 2D LVIDd:          3.60 cm   Diastology LVIDs:         1.90 cm   LV e' medial:    7.62 cm/s LV PW:         1.00 cm   LV E/e' medial:  9.1 LV IVS:        1.20 cm   LV e' lateral:   6.20 cm/s LVOT diam:     1.80 cm   LV E/e' lateral: 11.2 LV SV:         82 LV SV Index:   50 LVOT Area:     2.54 cm  RIGHT VENTRICLE             IVC RV Basal diam:  2.90 cm     IVC diam: 1.50 cm RV S prime:     10.70 cm/s TAPSE (M-mode): 2.0 cm LEFT ATRIUM             Index        RIGHT ATRIUM          Index LA diam:        3.20 cm 1.93 cm/m   RA Area:     7.80 cm LA Vol (A2C):   21.8 ml 13.17 ml/m  RA Volume:   16.10 ml 9.72 ml/m LA Vol (A4C):   24.3 ml 14.67 ml/m LA Biplane Vol: 24.3 ml 14.67 ml/m  AORTIC VALVE                    PULMONIC VALVE AV Area (Vmax):    3.44 cm     PV Vmax:       0.65 m/s AV Area (Vmean):   3.08 cm     PV Peak grad:  1.7 mmHg AV Area (VTI):     3.05 cm AV Vmax:           117.00 cm/s AV Vmean:          79.400 cm/s AV VTI:            0.270 m AV Peak Grad:      5.5 mmHg AV Mean Grad:      3.0 mmHg LVOT Vmax:         158.00 cm/s LVOT  Vmean:        96.200 cm/s LVOT VTI:          0.324 m LVOT/AV VTI ratio: 1.20 AI PHT:            952 msec AR Vena Contracta: 0.50 cm  AORTA Ao Root diam: 3.70 cm Ao Asc diam:  3.80 cm MITRAL VALVE               TRICUSPID VALVE MV Area (PHT): 2.87 cm    TR Peak grad:   18.5 mmHg MV Decel Time: 264 msec    TR Vmax:        215.00 cm/s MV E velocity: 69.20 cm/s MV A velocity: 60.90 cm/s  SHUNTS MV E/A ratio:  1.14        Systemic VTI:  0.32 m                            Systemic Diam: 1.80 cm Jeffrey Lesches MD Electronically signed by Jeffrey Lesches MD Signature Date/Time: 11/10/2022/6:01:11 PM    Final (Updated)    EEG adult  Result Date: 11/10/2022 Charlsie Quest, MD     11/10/2022  4:25 PM Patient Name: Kamarri Gandy MRN: [id number] Epilepsy Attending: Charlsie Quest Referring Physician/Provider: Osvaldo Shipper, MD Date: 11/10/2022 Duration: 23.14 mins Patient history: 88yo M  with syncope getting eeg to evaluate for seizure Level of alertness: Awake, asleep AEDs during EEG study: None Technical aspects: This EEG study was done with scalp electrodes positioned according to the 10-20 International system of electrode placement. Electrical activity was reviewed with band pass filter of 1-70Hz , sensitivity of 7 uV/mm, display speed of 70mm/sec with a 60Hz  notched filter applied as appropriate. EEG data were recorded continuously and digitally stored.  Video monitoring was available and reviewed as appropriate. Description: The posterior dominant rhythm consists of 7.5 Hz activity of moderate voltage (25-35 uV) seen predominantly in posterior head regions, symmetric and reactive to eye opening and eye closing. Sleep was characterized by vertex waves, sleep spindles (12 to 14 Hz), maximal frontocentral region. Hyperventilation and photic stimulation were not performed.   IMPRESSION: This study is within normal limits. No seizures or epileptiform discharges were seen throughout the recording. A normal interictal EEG does not exclude the diagnosis of epilepsy. Charlsie Quest   DG Abd Portable 2V  Result Date: 11/10/2022 CLINICAL DATA:  Abdominal pain EXAM: PORTABLE ABDOMEN - 2 VIEW COMPARISON:  None Available. FINDINGS: Air and fluid distending the stomach. There is some gas and stool along the right side of the colon and rectosigmoid colon. Minimal small bowel gas. No definite obstruction. No definite free air seen beneath the diaphragm on the upright view. Overlapping cardiac leads. Presumed loop recorder overlying the lower left thorax. Curvature of the spine with scattered degenerative changes of the spine and pelvis. IMPRESSION: Nonspecific bowel gas pattern with scattered colonic stool. Electronically Signed   By: Karen Kays M.D.   On: 11/10/2022 11:18   DG CHEST PORT 1 VIEW  Result Date: 11/09/2022 CLINICAL DATA:  Larey Seat, altered level of consciousness EXAM: PORTABLE CHEST 1 VIEW  COMPARISON:  06/15/2021 FINDINGS: Single frontal view of the chest demonstrates an unremarkable cardiac silhouette. Stable ectasia and atherosclerosis of the thoracic aorta. No acute airspace disease, effusion, or pneumothorax. No acute bony abnormality. IMPRESSION: 1. No acute intrathoracic process. Electronically Signed   By: Sharlet Salina M.D.   On: 11/09/2022 17:46   CT Head  Wo Contrast  Result Date: 11/09/2022 CLINICAL DATA:  Possible fall, found down, unwitnessed trauma EXAM: CT HEAD WITHOUT CONTRAST CT CERVICAL SPINE WITHOUT CONTRAST TECHNIQUE: Multidetector CT imaging of the head and cervical spine was performed following the standard protocol without intravenous contrast. Multiplanar CT image reconstructions of the cervical spine were also generated. RADIATION DOSE REDUCTION: This exam was performed according to the departmental dose-optimization program which includes automated exposure control, adjustment of the mA and/or kV according to patient size and/or use of iterative reconstruction technique. COMPARISON:  08/09/2021 FINDINGS: CT HEAD FINDINGS Brain: No evidence of acute infarction, hemorrhage, hydrocephalus, extra-axial collection or mass lesion/mass effect. Periventricular and deep white matter hypodensity. Unchanged encephalomalacia of the medial left occipital lobe (series 3, image 9). Vascular: No hyperdense vessel or unexpected calcification. Skull: Normal. Negative for fracture or focal lesion. Sinuses/Orbits: No acute finding. Other: None. CT CERVICAL SPINE FINDINGS Alignment: Normal. Skull base and vertebrae: No acute fracture. No primary bone lesion or focal pathologic process. Soft tissues and spinal canal: No prevertebral fluid or swelling. No visible canal hematoma. Disc levels: Moderate multilevel disc space height loss and osteophytosis. Upper chest: Negative. Other: None. IMPRESSION: 1. No acute intracranial pathology. Small-vessel white matter disease and unchanged  encephalomalacia of the medial left occipital lobe. 2. No fracture or static subluxation of the cervical spine. 3. Moderate multilevel cervical disc degenerative disease. Electronically Signed   By: Jearld Lesch M.D.   On: 11/09/2022 17:28   CT Cervical Spine Wo Contrast  Result Date: 11/09/2022 CLINICAL DATA:  Possible fall, found down, unwitnessed trauma EXAM: CT HEAD WITHOUT CONTRAST CT CERVICAL SPINE WITHOUT CONTRAST TECHNIQUE: Multidetector CT imaging of the head and cervical spine was performed following the standard protocol without intravenous contrast. Multiplanar CT image reconstructions of the cervical spine were also generated. RADIATION DOSE REDUCTION: This exam was performed according to the departmental dose-optimization program which includes automated exposure control, adjustment of the mA and/or kV according to patient size and/or use of iterative reconstruction technique. COMPARISON:  08/09/2021 FINDINGS: CT HEAD FINDINGS Brain: No evidence of acute infarction, hemorrhage, hydrocephalus, extra-axial collection or mass lesion/mass effect. Periventricular and deep white matter hypodensity. Unchanged encephalomalacia of the medial left occipital lobe (series 3, image 9). Vascular: No hyperdense vessel or unexpected calcification. Skull: Normal. Negative for fracture or focal lesion. Sinuses/Orbits: No acute finding. Other: None. CT CERVICAL SPINE FINDINGS Alignment: Normal. Skull base and vertebrae: No acute fracture. No primary bone lesion or focal pathologic process. Soft tissues and spinal canal: No prevertebral fluid or swelling. No visible canal hematoma. Disc levels: Moderate multilevel disc space height loss and osteophytosis. Upper chest: Negative. Other: None. IMPRESSION: 1. No acute intracranial pathology. Small-vessel white matter disease and unchanged encephalomalacia of the medial left occipital lobe. 2. No fracture or static subluxation of the cervical spine. 3. Moderate multilevel  cervical disc degenerative disease. Electronically Signed   By: Jearld Lesch M.D.   On: 11/09/2022 17:28    DISCHARGE EXAMINATION: See progress note from earlier today  DISPOSITION: Home  Discharge Instructions     Call MD for:  difficulty breathing, headache or visual disturbances   Complete by: As directed    Call MD for:  extreme fatigue   Complete by: As directed    Call MD for:  hives   Complete by: As directed    Call MD for:  persistant dizziness or light-headedness   Complete by: As directed    Call MD for:  persistant nausea and vomiting  Complete by: As directed    Call MD for:  severe uncontrolled pain   Complete by: As directed    Call MD for:  temperature >100.4   Complete by: As directed    Diet - low sodium heart healthy   Complete by: As directed    Discharge instructions   Complete by: As directed    Please note the changes to your blood pressure medications.  Please be sure to follow-up with your primary care provider within 1 week.  Check your blood pressure on a daily basis and do not take your blood pressure medicine if your systolic blood pressure is less than 105.  You were cared for by a hospitalist during your hospital stay. If you have any questions about your discharge medications or the care you received while you were in the hospital after you are discharged, you can call the unit and asked to speak with the hospitalist on call if the hospitalist that took care of you is not available. Once you are discharged, your primary care physician will handle any further medical issues. Please note that NO REFILLS for any discharge medications will be authorized once you are discharged, as it is imperative that you return to your primary care physician (or establish a relationship with a primary care physician if you do not have one) for your aftercare needs so that they can reassess your need for medications and monitor your lab values. If you do not have a primary  care physician, you can call [phone] for a physician referral.   Increase activity slowly   Complete by: As directed           Allergies as of 11/10/2022   No Known Allergies      Medication List     STOP taking these medications    amLODipine-benazepril 10-20 MG capsule Commonly known as: LOTREL       TAKE these medications    atorvastatin 20 MG tablet Commonly known as: LIPITOR Take 1 tablet (20 mg total) by mouth daily at 6 PM. What changed: when to take this   busPIRone 5 MG tablet Commonly known as: BUSPAR Take 5 mg by mouth 2 (two) times daily.   Eliquis 5 MG Tabs tablet Generic drug: apixaban Take 1 tablet by mouth twice daily   hydrochlorothiazide 25 MG tablet Commonly known as: HYDRODIURIL Take 25 mg by mouth daily.   metFORMIN 1000 MG tablet Commonly known as: GLUCOPHAGE Take 1 tablet by mouth 2 (two) times daily with a meal.   nitroGLYCERIN 0.4 MG SL tablet Commonly known as: NITROSTAT Place 1 tablet (0.4 mg total) under the tongue every 5 (five) minutes as needed for chest pain (notify PCP if experiencing chest pain and need to take Nitroglycerin).   venlafaxine XR 150 MG 24 hr capsule Commonly known as: EFFEXOR-XR Take 150 mg by mouth daily.          Follow-up Information     Jeffrey Nurse, MD Follow up in 1 week(s).   Specialty: Internal Medicine Why: post hospitalization follow up Contact information: [address] [place] [address] [phone]                 TOTAL DISCHARGE TIME: 35 minutes  Rashaad Hallstrom Rito Ehrlich  [facility] Pager on [email or web address]  11/11/2022, 11:01 AM

## 2022-11-10 NOTE — Progress Notes (Signed)
EEG complete - results pending 

## 2022-11-10 NOTE — Progress Notes (Signed)
[facility] PROGRESS NOTE   Jeffrey Rojas JYN:[id number] DOB: February 05, 1935 DOA: 11/09/2022  PCP: Gracelyn Nurse, MD  Brief History/Interval Summary: 88 y.o. male with medical history significant for PAF on Eliquis, history of CVA, T2DM, HTN, HLD, prostate cancer who presented to the ED for evaluation after a fall at home.  History is limited from patient as he does not recall the events prior to arrival.  He apparently has an unsteady gait at baseline, uses a cane, but does frequently trip and lose balance.  He says he is does not routinely use a cane in his home. Patient was found down on the floor in his house by his neighbor today after unknown downtime.  Patient does not remember falling or anything prior to arriving to the hospital.  Per ED triage documentation patient was reportedly initially unresponsive when neighbor found him.  He was disoriented x 4 with EMS but slowly became fully oriented on route to the ED.  Consultants: None  Procedures: Echocardiogram and EEG is pending.    Subjective/Interval History: Patient sleepy this morning.  Denies any chest pain or shortness of breath.  When his abdomen was being palpated he is complaining of pain.  No nausea vomiting has been noted.  His daughter is at the bedside.  Patient does not know the events of yesterday.  Daughter does not think that he passed out but she is not sure.    Assessment/Plan:  Presumed syncope with fall at home Etiology unclear.  Orthostatic vital signs were unremarkable. Echocardiogram is pending.  Due to lactic acidosis.  Also go ahead and get a EEG. CT of the head and neck was reassuring.  No focal neurological deficits noted. PT and OT evaluation.  Lactic acidosis Etiology unclear.  No evidence for infection.  Was noted to be on metformin prior to admission which is currently on hold.  Will recheck lactic acid level later today.  EEG will be ordered as discussed above.  Abdominal  tenderness Patient noted pain when I was palpating his abdomen, mainly in the right upper quadrant.  LFTs were normal yesterday.  Will recheck this morning along with lipase levels.  Abdominal films will be ordered.  Hypomagnesemia Repleted.  Paroxysmal atrial fibrillation Continue with Eliquis.  Not noted to be on any rate limiting medications.  Essential hypertension Holding antihypertensives.  Apparently his cardiologist wanted to stop his benazepril and continue only amlodipine due to orthostasis.  He might have been taking both of these medications.  History of stroke Continue Eliquis and statin.  PT and OT evaluation.  History of depression Continue medications.  DVT Prophylaxis: On apixaban Code Status: Full code Family Communication: Discussed with the patient and his daughter Disposition Plan: Anticipate return home when improved  Status is: Observation The patient remains OBS appropriate and may d/c before 2 midnights.      Medications: Scheduled:  apixaban  2.5 mg Oral BID   atorvastatin  20 mg Oral Daily   busPIRone  5 mg Oral BID   sodium chloride flush  3 mL Intravenous Q12H   venlafaxine XR  150 mg Oral Daily   Continuous:  0.9 % NaCl with KCl 20 mEq / L 100 mL/hr at 11/10/22 0758   QMV:[id number] **OR** acetaminophen, ondansetron **OR** ondansetron (ZOFRAN) IV, senna-docusate  Antibiotics: Anti-infectives (From admission, onward)    None       Objective:  Vital Signs  Vitals:   11/09/22 2300 11/09/22 2325 11/10/22 0425 11/10/22 0755  BP: 124/70  132/74 100/64   Pulse: (!) 58 65 (!) 59   Resp: 14 18 16 17   Temp:  98 F (36.7 C) 97.8 F (36.6 C) 98.2 F (36.8 C)  TempSrc:  Oral Oral Oral  SpO2: 100% 98% 100%   Weight:  53.5 kg 53.9 kg   Height:        Intake/Output Summary (Last 24 hours) at 11/10/2022 0926 Last data filed at 11/10/2022 0758 Gross per 24 hour  Intake 2039.98 ml  Output 400 ml  Net 1639.98 ml   Filed Weights    11/09/22 1613 11/09/22 2325 11/10/22 0425  Weight: 63.5 kg 53.5 kg 53.9 kg    General appearance: Awake alert.  In no distress Resp: Clear to auscultation bilaterally.  Normal effort Cardio: S1-S2 is normal regular.  No S3-S4.  No rubs murmurs or bruit GI: Abdomen is soft.  Mildly tender in the right upper quadrant without any rebound rigidity or guarding.  No masses organomegaly. Extremities: No edema.  Full range of motion of lower extremities. Neurologic: Alert and oriented x3.  No focal neurological deficits.    Lab Results:  Data Reviewed: I have personally reviewed following labs and reports of the imaging studies  CBC: Recent Labs  Lab 11/09/22 1616 11/09/22 1634 11/10/22 0233  WBC 6.2  --  6.1  NEUTROABS 2.9  --   --   HGB 15.4 16.7 13.2  HCT 48.4 49.0 38.9*  MCV 88.2  --  85.1  PLT 282  --  233    Basic Metabolic Panel: Recent Labs  Lab 11/09/22 1616 11/09/22 1634 11/10/22 0233  NA 140 141 139  K 4.6 4.5 3.6  CL 101  --  103  CO2 27  --  30  GLUCOSE 129*  --  202*  BUN 12  --  12  CREATININE 1.13  --  1.08  CALCIUM 9.8  --  9.1  MG 1.6*  --  1.8    GFR: Estimated Creatinine Clearance: 38.1 mL/min (by C-G formula based on SCr of 1.08 mg/dL).  Liver Function Tests: Recent Labs  Lab 11/09/22 1616  AST 20  ALT 18  ALKPHOS 110  BILITOT 0.8  PROT 7.8  ALBUMIN 4.0     Cardiac Enzymes: Recent Labs  Lab 11/09/22 1646  CKTOTAL 83    CBG: Recent Labs  Lab 11/10/22 0618  GLUCAP 94      Radiology Studies: DG CHEST PORT 1 VIEW  Result Date: 11/09/2022 CLINICAL DATA:  Larey Seat, altered level of consciousness EXAM: PORTABLE CHEST 1 VIEW COMPARISON:  06/15/2021 FINDINGS: Single frontal view of the chest demonstrates an unremarkable cardiac silhouette. Stable ectasia and atherosclerosis of the thoracic aorta. No acute airspace disease, effusion, or pneumothorax. No acute bony abnormality. IMPRESSION: 1. No acute intrathoracic process. Electronically  Signed   By: Sharlet Salina M.D.   On: 11/09/2022 17:46   CT Head Wo Contrast  Result Date: 11/09/2022 CLINICAL DATA:  Possible fall, found down, unwitnessed trauma EXAM: CT HEAD WITHOUT CONTRAST CT CERVICAL SPINE WITHOUT CONTRAST TECHNIQUE: Multidetector CT imaging of the head and cervical spine was performed following the standard protocol without intravenous contrast. Multiplanar CT image reconstructions of the cervical spine were also generated. RADIATION DOSE REDUCTION: This exam was performed according to the departmental dose-optimization program which includes automated exposure control, adjustment of the mA and/or kV according to patient size and/or use of iterative reconstruction technique. COMPARISON:  08/09/2021 FINDINGS: CT HEAD FINDINGS Brain: No evidence of acute infarction, hemorrhage,  hydrocephalus, extra-axial collection or mass lesion/mass effect. Periventricular and deep white matter hypodensity. Unchanged encephalomalacia of the medial left occipital lobe (series 3, image 9). Vascular: No hyperdense vessel or unexpected calcification. Skull: Normal. Negative for fracture or focal lesion. Sinuses/Orbits: No acute finding. Other: None. CT CERVICAL SPINE FINDINGS Alignment: Normal. Skull base and vertebrae: No acute fracture. No primary bone lesion or focal pathologic process. Soft tissues and spinal canal: No prevertebral fluid or swelling. No visible canal hematoma. Disc levels: Moderate multilevel disc space height loss and osteophytosis. Upper chest: Negative. Other: None. IMPRESSION: 1. No acute intracranial pathology. Small-vessel white matter disease and unchanged encephalomalacia of the medial left occipital lobe. 2. No fracture or static subluxation of the cervical spine. 3. Moderate multilevel cervical disc degenerative disease. Electronically Signed   By: Jearld Lesch M.D.   On: 11/09/2022 17:28   CT Cervical Spine Wo Contrast  Result Date: 11/09/2022 CLINICAL DATA:  Possible  fall, found down, unwitnessed trauma EXAM: CT HEAD WITHOUT CONTRAST CT CERVICAL SPINE WITHOUT CONTRAST TECHNIQUE: Multidetector CT imaging of the head and cervical spine was performed following the standard protocol without intravenous contrast. Multiplanar CT image reconstructions of the cervical spine were also generated. RADIATION DOSE REDUCTION: This exam was performed according to the departmental dose-optimization program which includes automated exposure control, adjustment of the mA and/or kV according to patient size and/or use of iterative reconstruction technique. COMPARISON:  08/09/2021 FINDINGS: CT HEAD FINDINGS Brain: No evidence of acute infarction, hemorrhage, hydrocephalus, extra-axial collection or mass lesion/mass effect. Periventricular and deep white matter hypodensity. Unchanged encephalomalacia of the medial left occipital lobe (series 3, image 9). Vascular: No hyperdense vessel or unexpected calcification. Skull: Normal. Negative for fracture or focal lesion. Sinuses/Orbits: No acute finding. Other: None. CT CERVICAL SPINE FINDINGS Alignment: Normal. Skull base and vertebrae: No acute fracture. No primary bone lesion or focal pathologic process. Soft tissues and spinal canal: No prevertebral fluid or swelling. No visible canal hematoma. Disc levels: Moderate multilevel disc space height loss and osteophytosis. Upper chest: Negative. Other: None. IMPRESSION: 1. No acute intracranial pathology. Small-vessel white matter disease and unchanged encephalomalacia of the medial left occipital lobe. 2. No fracture or static subluxation of the cervical spine. 3. Moderate multilevel cervical disc degenerative disease. Electronically Signed   By: Jearld Lesch M.D.   On: 11/09/2022 17:28       LOS: 0 days   Marvene Strohm Rito Ehrlich  [facility] Pager on [email or web address]  11/10/2022, 9:26 AM

## 2023-03-22 ENCOUNTER — Ambulatory Visit: Payer: Medicare Other | Attending: Cardiology | Admitting: Cardiology

## 2023-03-22 NOTE — Progress Notes (Deleted)
Cardiology Office Note Date:  03/22/2023  Patient ID:  Jeffrey Rojas, Jeffrey Rojas June 02, 1935, MRN [id number] PCP:  Gracelyn Nurse, MD  Cardiologist:  Chilton Si, MD Electrophysiologist: Sherryl Manges, MD  ***refresh   Chief Complaint: ***  History of Present Illness: Jeffrey Rojas is a 88 y.o. male with PMH notable for cryptogenic stroke > ILR implant > parox afib, HTN, T2DM, unexplained weight loss; seen today for Sherryl Manges, MD for routine electrophysiology followup.  He last saw Dr. Graciela Husbands, c/o [facility] and balance w presyncope, adjusted HTN meds. Cont weight loss, but stable.  He was found down in his home by neighbor 11/2022 with AMS.   Since last being seen in our clinic the patient reports doing ***.  he denies chest pain, palpitations, dyspnea, PND, orthopnea, nausea, vomiting, dizziness, syncope, edema, weight gain, or early satiety.     Device Information: ***  AAD History: ***  Past Medical History:  Diagnosis Date   Arthritis    Cancer (HCC)    History of prostate cancer    Stroke (HCC) 07/2017    Past Surgical History:  Procedure Laterality Date   LOOP RECORDER INSERTION N/A 07/11/2017   Procedure: LOOP RECORDER INSERTION;  Surgeon: Regan Lemming, MD;  Location: [facility] INVASIVE CV LAB;  Service: Cardiovascular;  Laterality: N/A;   NO PAST SURGERIES     TEE WITHOUT CARDIOVERSION N/A 07/11/2017   Procedure: TRANSESOPHAGEAL ECHOCARDIOGRAM (TEE);  Surgeon: Vesta Mixer, MD;  Location: [facility] ENDOSCOPY;  Service: Cardiovascular;  Laterality: N/A;    Current Outpatient Medications  Medication Instructions   atorvastatin (LIPITOR) 20 mg, Oral, Daily-1800   busPIRone (BUSPAR) 5 mg, Oral, 2 times daily   Eliquis 5 mg, Oral, 2 times daily   hydrochlorothiazide (HYDRODIURIL) 25 mg, Oral, Daily   metFORMIN (GLUCOPHAGE) 1000 MG tablet 1 tablet, Oral, 2 times daily with meals   nitroGLYCERIN (NITROSTAT) 0.4 mg, Sublingual, Every 5 min PRN   venlafaxine XR (EFFEXOR-XR) 150  mg, Oral, Daily    Social History:  The patient  reports that he has never smoked. He has never used smokeless tobacco. He reports current alcohol use. He reports that he does not use drugs.   Family History:  *** include only if pertinent The patient's family history is not on file.***  ROS:  Please see the history of present illness. All other systems are reviewed and otherwise negative.   PHYSICAL EXAM: *** VS:  There were no vitals taken for this visit. BMI: There is no height or weight on file to calculate BMI.  GEN- The patient is well appearing, alert and oriented x 3 today.   Lungs- Clear to ausculation bilaterally, normal work of breathing.  Heart- {Blank single:19197::"Regular","Irregularly irregular"} rate and rhythm, no murmurs, rubs or gallops Extremities- {EDEMA LEVEL:28147::"No"} peripheral edema, warm, dry Skin-  *** device pocket well-healed, no tethering   Device interrogation done today and reviewed by myself:  Battery *** Lead thresholds, impedence, sensing stable *** *** episodes *** changes made today  EKG {ACTION; IS/IS WUJ:81191478} ordered. Personal review of EKG from {Blank single:19197::"today","***"} shows:  ***        Recent Labs: 11/10/2022: ALT 20; BUN 12; Creatinine, Ser 1.08; Hemoglobin 13.2; Magnesium 1.8; Platelets 233; Potassium 3.6; Sodium 139  No results found for requested labs within last 365 days.   CrCl cannot be calculated (Patient's most recent lab result is older than the maximum 21 days allowed.).   Wt Readings from Last 3 Encounters:  11/10/22 118 lb  13.3 oz (53.9 kg)  12/13/21 135 lb (61.2 kg)  08/09/21 135 lb (61.2 kg)     Additional studies reviewed include: Previous EP, cardiology notes.     ASSESSMENT AND PLAN:  #) ***   #) ***   {Are you ordering a CV Procedure (e.g. stress test, cath, DCCV, TEE, etc)?   Press F2        :[id number]}   Current medicines are reviewed at length with the patient today.   The  patient {ACTIONS; HAS/DOES NOT HAVE:19233} concerns regarding his medicines.  The following changes were made today:  {NONE DEFAULTED:18576}  Labs/ tests ordered today include: *** No orders of the defined types were placed in this encounter.    Disposition: Follow up with {EPMDS:28135} or EP APP {EPFOLLOW UP:28173}   Signed, Sherie Don, NP  03/22/23  8:53 AM  Electrophysiology [facility]

## 2023-04-18 ENCOUNTER — Other Ambulatory Visit: Payer: Self-pay | Admitting: Internal Medicine

## 2023-04-18 DIAGNOSIS — I480 Paroxysmal atrial fibrillation: Secondary | ICD-10-CM

## 2023-04-18 NOTE — Telephone Encounter (Addendum)
Prescription refill request for Eliquis received. Indication: Afib  Last office visit: 12/13/21 Graciela Husbands)  Scr: 1.08 (11/10/22)  Age: 88 Weight: 53.9kg  Per dosing criteria, dose not appropriate. Per office visit on 12/13/21,  "With ongoing weight loss and age, now Apixaban  dose appropriately at 2.5 bid - REDUCE Eliquis to 2.5 mg twice a day."   Dose changed to 2.5mg  BID. Refill sent.

## 2023-04-26 ENCOUNTER — Other Ambulatory Visit: Payer: Self-pay

## 2023-04-26 ENCOUNTER — Emergency Department
Admission: EM | Admit: 2023-04-26 | Discharge: 2023-04-26 | Discharge status: Against Medical Advice | Payer: Medicare Other | Attending: Emergency Medicine | Admitting: Emergency Medicine

## 2023-04-26 DIAGNOSIS — R42 Dizziness and giddiness: Secondary | ICD-10-CM | POA: Insufficient documentation

## 2023-04-26 DIAGNOSIS — Z5321 Procedure and treatment not carried out due to patient leaving prior to being seen by health care provider: Secondary | ICD-10-CM | POA: Insufficient documentation

## 2023-04-26 LAB — COMPREHENSIVE METABOLIC PANEL
ALT: 19 U/L (ref 0–44)
AST: 17 U/L (ref 15–41)
Albumin: 4.2 g/dL (ref 3.5–5.0)
Alkaline Phosphatase: 98 U/L (ref 38–126)
Anion gap: 9 (ref 5–15)
BUN: 20 mg/dL (ref 8–23)
CO2: 28 mmol/L (ref 22–32)
Calcium: 9.2 mg/dL (ref 8.9–10.3)
Chloride: 102 mmol/L (ref 98–111)
Creatinine, Ser: 0.99 mg/dL (ref 0.61–1.24)
GFR, Estimated: >60 mL/min (ref >60)
Glucose, Bld: 110 mg/dL — ABNORMAL HIGH (ref 70–99)
Potassium: 4.3 mmol/L (ref 3.5–5.1)
Sodium: 139 mmol/L (ref 135–145)
Total Bilirubin: 0.9 mg/dL (ref 0.3–1.2)
Total Protein: 8.1 g/dL (ref 6.5–8.1)

## 2023-04-26 LAB — CBC WITH DIFFERENTIAL/PLATELET
Abs Immature Granulocytes: 0.02 10*3/uL (ref 0.00–0.07)
Basophils Absolute: 0 10*3/uL (ref 0.0–0.1)
Basophils Relative: 0 %
Eosinophils Absolute: 0 10*3/uL (ref 0.0–0.5)
Eosinophils Relative: 0 %
HCT: 49.3 % (ref 39.0–52.0)
Hemoglobin: 15.6 g/dL (ref 13.0–17.0)
Immature Granulocytes: 0 %
Lymphocytes Relative: 38 %
Lymphs Abs: 2.8 10*3/uL (ref 0.7–4.0)
MCH: 28.9 pg (ref 26.0–34.0)
MCHC: 31.6 g/dL (ref 30.0–36.0)
MCV: 91.5 fL (ref 80.0–100.0)
Monocytes Absolute: 0.5 10*3/uL (ref 0.1–1.0)
Monocytes Relative: 7 %
Neutro Abs: 4 10*3/uL (ref 1.7–7.7)
Neutrophils Relative %: 55 %
Platelets: 287 10*3/uL (ref 150–400)
RBC: 5.39 MIL/uL (ref 4.22–5.81)
RDW: 15.2 % (ref 11.5–15.5)
WBC: 7.4 10*3/uL (ref 4.0–10.5)
nRBC: 0 % (ref 0.0–0.2)

## 2023-04-26 LAB — TROPONIN I (HIGH SENSITIVITY): Troponin I (High Sensitivity): 8 ng/L (ref <18)

## 2023-04-26 NOTE — ED Triage Notes (Signed)
Pt from home via ems- reports that pt was sitting in chair at home and family came to visit and seen pt "slumped over" in chair. EMS was called- when EMS arrived pt was A/O x 4 with stable VS.

## 2023-04-26 NOTE — ED Triage Notes (Signed)
[facility]. Pt alert and awake with no distress noted. Pt voices no concerns but he is oriented to self only.

## 2023-05-24 ENCOUNTER — Encounter: Payer: Self-pay | Admitting: Ophthalmology

## 2023-05-28 NOTE — Discharge Instructions (Signed)

## 2023-05-29 NOTE — Anesthesia Preprocedure Evaluation (Signed)
Anesthesia Evaluation  Patient identified by MRN, date of birth, ID band Patient awake  General Assessment Comment:Patient mildly confused, but does know he is having cataract surgery and was able to demonstrate the left eye as the operative eye.  Granddaughter with patient.    Reviewed: Allergy & Precautions, H&P , NPO status , Patient's Chart, lab work & pertinent test results  Airway Mallampati: III  TM Distance: >3 FB Neck ROM: Full    Dental no notable dental hx.    Pulmonary neg pulmonary ROS   Pulmonary exam normal breath sounds clear to auscultation       Cardiovascular hypertension, Normal cardiovascular exam Rhythm:Regular Rate:Normal  11-10-22  1. Left ventricular ejection fraction, by estimation, is 65 to 70%. The  left ventricle has normal function. The left ventricle has no regional  wall motion abnormalities. There is mild left ventricular hypertrophy.  Left ventricular diastolic parameters  are indeterminate.   2. Right ventricular systolic function is normal. The right ventricular  size is normal. There is normal pulmonary artery systolic pressure. The  estimated right ventricular systolic pressure is 21.5 mmHg.   3. The mitral valve is normal in structure. Trivial mitral valve  regurgitation.   4. The aortic valve is tricuspid. Aortic valve regurgitation is mild to  moderate. Aortic valve sclerosis/calcification is present, without any  evidence of aortic stenosis.   5. The inferior vena cava is normal in size with greater than 50%  respiratory variability, suggesting right atrial pressure of 3 mmHg.     Neuro/Psych        A little confused this morning, but was able to tell me he is having "eye surgery" on his "left eye."  Granddaughter says he did not have breakfast this morning; he had eggs "lst night."  Says patient does not use his cane when walking at home, even though he is unstable. negative neurological  ROS  negative psych ROS   GI/Hepatic negative GI ROS, Neg liver ROS,,,  Endo/Other  diabetes    Renal/GU negative Renal ROS  negative genitourinary   Musculoskeletal negative musculoskeletal ROS (+)    Abdominal   Peds negative pediatric ROS (+)  Hematology negative hematology ROS (+)   Anesthesia Other Findings Arthritis  History of prostate cancer Cancer  Stroke (HCC) Hypertension PAF (paroxysmal atrial fibrillation)  Hyperlipidemia  Type 2 diabetes mellitus (HCC)    Reproductive/Obstetrics negative OB ROS                             Anesthesia Physical Anesthesia Plan  ASA: 3  Anesthesia Plan: MAC   Post-op Pain Management:    Induction: Intravenous  PONV Risk Score and Plan:   Airway Management Planned: Natural Airway and Nasal Cannula  Additional Equipment:   Intra-op Plan:   Post-operative Plan:   Informed Consent: I have reviewed the patients History and Physical, chart, labs and discussed the procedure including the risks, benefits and alternatives for the proposed anesthesia with the patient or authorized representative who has indicated his/her understanding and acceptance.     Dental Advisory Given  Plan Discussed with: Anesthesiologist, CRNA and Surgeon  Anesthesia Plan Comments: (Patient consented for risks of anesthesia including but not limited to:  - adverse reactions to medications - damage to eyes, teeth, lips or other oral mucosa - nerve damage due to positioning  - sore throat or hoarseness - Damage to heart, brain, nerves, lungs, other parts of body  or loss of life  Patient voiced understanding and assent.)        Anesthesia Quick Evaluation

## 2023-05-30 ENCOUNTER — Ambulatory Visit: Payer: Medicare Other | Admitting: Anesthesiology

## 2023-05-30 ENCOUNTER — Ambulatory Visit
Admission: RE | Admit: 2023-05-30 | Discharge: 2023-05-30 | Disposition: A | Discharge status: Home / Self Care | Payer: Medicare Other | Attending: Ophthalmology | Admitting: Ophthalmology

## 2023-05-30 ENCOUNTER — Other Ambulatory Visit: Payer: Self-pay

## 2023-05-30 ENCOUNTER — Encounter: Payer: Self-pay | Admitting: Ophthalmology

## 2023-05-30 ENCOUNTER — Encounter
Admission: RE | Disposition: A | Discharge status: Home / Self Care | Payer: Self-pay | Source: Home / Self Care | Attending: Ophthalmology

## 2023-05-30 DIAGNOSIS — Z79899 Other long term (current) drug therapy: Secondary | ICD-10-CM | POA: Insufficient documentation

## 2023-05-30 DIAGNOSIS — I351 Nonrheumatic aortic (valve) insufficiency: Secondary | ICD-10-CM | POA: Diagnosis not present

## 2023-05-30 DIAGNOSIS — I10 Essential (primary) hypertension: Secondary | ICD-10-CM | POA: Diagnosis not present

## 2023-05-30 DIAGNOSIS — I480 Paroxysmal atrial fibrillation: Secondary | ICD-10-CM | POA: Diagnosis not present

## 2023-05-30 DIAGNOSIS — Z7901 Long term (current) use of anticoagulants: Secondary | ICD-10-CM | POA: Insufficient documentation

## 2023-05-30 DIAGNOSIS — E785 Hyperlipidemia, unspecified: Secondary | ICD-10-CM | POA: Insufficient documentation

## 2023-05-30 DIAGNOSIS — H2512 Age-related nuclear cataract, left eye: Secondary | ICD-10-CM | POA: Insufficient documentation

## 2023-05-30 DIAGNOSIS — Z8546 Personal history of malignant neoplasm of prostate: Secondary | ICD-10-CM | POA: Insufficient documentation

## 2023-05-30 DIAGNOSIS — I517 Cardiomegaly: Secondary | ICD-10-CM | POA: Diagnosis not present

## 2023-05-30 DIAGNOSIS — E1136 Type 2 diabetes mellitus with diabetic cataract: Secondary | ICD-10-CM | POA: Diagnosis present

## 2023-05-30 DIAGNOSIS — Z7984 Long term (current) use of oral hypoglycemic drugs: Secondary | ICD-10-CM | POA: Diagnosis not present

## 2023-05-30 HISTORY — DX: Type 2 diabetes mellitus without complications: E11.9

## 2023-05-30 HISTORY — DX: Paroxysmal atrial fibrillation: I48.0

## 2023-05-30 HISTORY — DX: Essential (primary) hypertension: I10

## 2023-05-30 HISTORY — PX: CATARACT EXTRACTION W/PHACO: SHX586

## 2023-05-30 HISTORY — DX: Hyperlipidemia, unspecified: E78.5

## 2023-05-30 LAB — GLUCOSE, CAPILLARY: Glucose-Capillary: 91 mg/dL (ref 70–99)

## 2023-05-30 SURGERY — PHACOEMULSIFICATION, CATARACT, WITH IOL INSERTION
Anesthesia: Monitor Anesthesia Care | Site: Eye | Laterality: Left

## 2023-05-30 MED ORDER — SIGHTPATH DOSE#1 NA HYALUR & NA CHOND-NA HYALUR IO KIT
PACK | INTRAOCULAR | Status: DC | PRN
  Administered 2023-05-30: 1 via OPHTHALMIC

## 2023-05-30 MED ORDER — LIDOCAINE HCL (PF) 2 % IJ SOLN
INTRAOCULAR | Status: DC | PRN
  Administered 2023-05-30: 1 mL via INTRAOCULAR

## 2023-05-30 MED ORDER — SIGHTPATH DOSE#1 BSS IO SOLN
INTRAOCULAR | Status: DC | PRN
  Administered 2023-05-30: 15 mL

## 2023-05-30 MED ORDER — BRIMONIDINE TARTRATE-TIMOLOL 0.2-0.5 % OP SOLN
OPHTHALMIC | Status: DC | PRN
  Administered 2023-05-30: 1 [drp] via OPHTHALMIC

## 2023-05-30 MED ORDER — TETRACAINE HCL 0.5 % OP SOLN
1.0000 [drp] | OPHTHALMIC | Status: DC | PRN
  Administered 2023-05-30 (×3): 1 [drp] via OPHTHALMIC

## 2023-05-30 MED ORDER — SIGHTPATH DOSE#1 BSS IO SOLN
INTRAOCULAR | Status: DC | PRN
  Administered 2023-05-30: 89 mL via OPHTHALMIC

## 2023-05-30 MED ORDER — ARMC OPHTHALMIC DILATING DROPS: 1.0000 | OPHTHALMIC | Status: DC | PRN

## 2023-05-30 MED ORDER — FENTANYL CITRATE (PF) 100 MCG/2ML IJ SOLN
INTRAMUSCULAR | Status: DC | PRN
  Administered 2023-05-30: 25 ug via INTRAVENOUS

## 2023-05-30 MED ORDER — PHENYLEPHRINE HCL 10 % OP SOLN
1.0000 [drp] | OPHTHALMIC | Status: DC | PRN
  Administered 2023-05-30 (×3): 1 [drp] via OPHTHALMIC

## 2023-05-30 MED ORDER — PHENYLEPHRINE HCL 10 % OP SOLN
OPHTHALMIC | Status: AC
  Filled 2023-05-30: qty 5

## 2023-05-30 MED ORDER — FENTANYL CITRATE (PF) 100 MCG/2ML IJ SOLN
INTRAMUSCULAR | Status: AC
  Filled 2023-05-30: qty 2

## 2023-05-30 MED ORDER — MOXIFLOXACIN HCL 0.5 % OP SOLN
OPHTHALMIC | Status: DC | PRN
  Administered 2023-05-30: .2 mL via OPHTHALMIC

## 2023-05-30 MED ORDER — MIDAZOLAM HCL 2 MG/2ML IJ SOLN
INTRAMUSCULAR | Status: AC
Start: 2023-05-30 — End: ?
  Filled 2023-05-30: qty 2

## 2023-05-30 MED ORDER — TETRACAINE HCL 0.5 % OP SOLN
OPHTHALMIC | Status: AC
  Filled 2023-05-30: qty 4

## 2023-05-30 MED ORDER — CYCLOPENTOLATE HCL 2 % OP SOLN
1.0000 [drp] | OPHTHALMIC | Status: DC | PRN
  Administered 2023-05-30 (×3): 1 [drp] via OPHTHALMIC

## 2023-05-30 MED ORDER — CYCLOPENTOLATE HCL 2 % OP SOLN
OPHTHALMIC | Status: AC
  Filled 2023-05-30: qty 2

## 2023-05-30 SURGICAL SUPPLY — 21 items
BNDG EYE OVAL 2 1/8 X 2 5/8 (GAUZE/BANDAGES/DRESSINGS) IMPLANT
CANNULA ANT/CHMB 27G (MISCELLANEOUS) IMPLANT
CANNULA ANT/CHMB 27GA (MISCELLANEOUS)
CATARACT SUITE SIGHTPATH (MISCELLANEOUS) ×1
DISSECTOR HYDRO NUCLEUS 50X22 (MISCELLANEOUS) ×1 IMPLANT
DRSG TEGADERM 2-3/8X2-3/4 SM (GAUZE/BANDAGES/DRESSINGS) ×1 IMPLANT
FEE CATARACT SUITE SIGHTPATH (MISCELLANEOUS) ×1 IMPLANT
GLOVE SURG SYN 7.5 E (GLOVE) ×1
GLOVE SURG SYN 7.5 PF PI (GLOVE) ×1 IMPLANT
GLOVE SURG SYN 8.5 E (GLOVE) ×1
GLOVE SURG SYN 8.5 PF PI (GLOVE) ×1 IMPLANT
LENS CLAREON WAGON WHEEL 21.0 (Intraocular Lens) ×1 IMPLANT
LENS IOL CLRN WGN WHL 21.0 (Intraocular Lens) IMPLANT
NDL FILTER BLUNT 18X1 1/2 (NEEDLE) IMPLANT
NEEDLE FILTER BLUNT 18X1 1/2 (NEEDLE)
PACK VIT ANT 23G (MISCELLANEOUS) IMPLANT
RING MALYGIN 7.0 (MISCELLANEOUS) IMPLANT
SUT ETHILON 10-0 CS-B-6CS-B-6 (SUTURE)
SUTURE EHLN 10-0 CS-B-6CS-B-6 (SUTURE) IMPLANT
SYR 3ML LL SCALE MARK (SYRINGE) IMPLANT
SYR 5ML LL (SYRINGE) IMPLANT

## 2023-05-30 NOTE — Op Note (Signed)
OPERATIVE NOTE  Jeffrey Rojas [id number] 05/30/2023   PREOPERATIVE DIAGNOSIS: Nuclear sclerotic cataract left eye. H25.12   POSTOPERATIVE DIAGNOSIS: Nuclear sclerotic cataract left eye. H25.12   PROCEDURE:  Phacoemusification with posterior chamber intraocular lens placement of the left eye  Ultrasound time: Procedure(s) with comments: CATARACT EXTRACTION PHACO AND INTRAOCULAR LENS PLACEMENT (IOC) LEFT DIABETIC (Left) - 13.89 1:14.3  LENS:   Implant Name Type Inv. Item Serial No. Manufacturer Lot No. LRB No. Used Action  LENS [employer] WAGON WHEEL 21.0 - [id number] Intraocular Lens LENS [employer] WAGON WHEEL 21.0 [id number] [employer]  Left 1 Implanted      SURGEON:  Julious Payer. Rolley Sims, MD   ANESTHESIA:  Topical with tetracaine drops, augmented with 1% preservative-free intracameral lidocaine.   COMPLICATIONS:  None.   DESCRIPTION OF PROCEDURE:  The patient was identified in the holding room and transported to the operating room and placed in the supine position under the operating microscope.  The left eye was identified as the operative eye, which was prepped and draped in the usual sterile ophthalmic fashion.   A 1 millimeter clear-corneal paracentesis was made inferotemporally. Preservative-free 1% lidocaine mixed with 1:1,000 bisulfite-free aqueous solution of epinephrine was injected into the anterior chamber. The anterior chamber was then filled with Viscoat viscoelastic. A 2.4 millimeter keratome was used to make a clear-corneal incision superotemporally. A curvilinear capsulorrhexis was made with a cystotome and capsulorrhexis forceps. Balanced salt solution was used to hydrodissect and hydrodelineate the nucleus. Phacoemulsification was then used to remove the lens nucleus and epinucleus. The remaining cortex was then removed using the irrigation and aspiration handpiece. Provisc was then placed into the capsular bag to distend it for lens placement. A +21.00 D [id number] intraocular  lens was then injected into the capsular bag. The remaining viscoelastic was aspirated.   Wounds were hydrated with balanced salt solution.  The anterior chamber was inflated to a physiologic pressure with balanced salt solution.  No wound leaks were noted. Vigamox was injected intracamerally.  Timolol and Brimonidine drops were applied to the eye.  The patient was taken to the recovery room in stable condition without complications of anesthesia or surgery.  [name] 05/30/2023, 7:55 AM

## 2023-05-30 NOTE — H&P (Signed)
[facility]   Primary Care Physician:  Gracelyn Nurse, MD Ophthalmologist: Dr. Deberah Pelton  Pre-Procedure History & Physical: HPI:  Jeffrey Rojas is a 88 y.o. male here for cataract surgery.   Past Medical History:  Diagnosis Date   Arthritis    Cancer [facility])    History of prostate cancer    Hyperlipidemia    Hypertension    PAF (paroxysmal atrial fibrillation) (HCC)    Stroke (HCC) 07/2017   Type 2 diabetes mellitus (HCC)     Past Surgical History:  Procedure Laterality Date   ANKLE SURGERY     COLONOSCOPY WITH ESOPHAGOGASTRODUODENOSCOPY (EGD)  11/24/2010   LOOP RECORDER INSERTION N/A 07/11/2017   Procedure: LOOP RECORDER INSERTION;  Surgeon: Regan Lemming, MD;  [facility] INVAS[facility] CV [facility];  Service: Cardiovascular;  Laterality: N/A;   PROSTATECTOMY     TEE WITHOUT CARDIOVERSION N/A 07/11/2017   Procedure: TRANSESOPHAGEAL ECHOCARDIOGRAM (TEE);  Surgeon: Elease Hashimoto Deloris Ping, MD;  Location: [facility] ENDOSCOPY;  Service: Cardiovascular;  Laterality: N/A;    Prior to Admission medications   Medication Sig Start Date End Date Taking? Authorizing Provider  apixaban (ELIQUIS) 2.5 MG TABS tablet Take 1 tablet (2.5 mg total) by mouth 2 (two) times daily. Patient taking differently: Take 5 mg by mouth 2 (two) times daily. 04/18/23  Yes Duke Salvia, MD  atorvastatin (LIPITOR) 20 MG tablet Take 1 tablet (20 mg total) by mouth daily at 6 PM. Patient taking differently: Take 20 mg by mouth daily. 09/12/17  Yes McCue, Shanda Bumps, NP  busPIRone (BUSPAR) 5 MG tablet Take 5 mg by mouth 2 (two) times daily.   Yes [provider]  hydrochlorothiazide (HYDRODIURIL) 25 MG tablet Take 25 mg by mouth daily.   Yes [provider]  metFORMIN (GLUCOPHAGE) 1000 MG tablet Take 1 tablet by mouth 2 (two) times daily with a meal. 10/07/20  Yes [provider]  venlafaxine XR (EFFEXOR-XR) 150 MG 24 hr capsule Take 150 mg by mouth daily. 04/20/21  Yes [provider]   nitroGLYCERIN (NITROSTAT) 0.4 MG SL tablet Place 1 tablet (0.4 mg total) under the tongue every 5 (five) minutes as needed for chest pain (notify PCP if experiencing chest pain and need to take Nitroglycerin). 07/11/17   Beryl Meager, NP    Allergies as of 04/30/2023   (No Known Allergies)    Family History  Problem Relation Age of Onset   Prostate cancer Neg Hx    Kidney cancer Neg Hx     Social History   Socioeconomic History   Marital status: Widowed    Spouse name: Not on file   Number of children: Not on file   Years of education: Not on file   Highest education level: Not on file  Occupational History   Not on file  Tobacco Use   Smoking status: Never   Smokeless tobacco: Never  Vaping Use   Vaping status: Never Used  Substance and Sexual Activity   Alcohol use: Not Currently    Comment: approx 1 drink every 6 months   Drug use: No   Sexual activity: Not on file  Other Topics Concern   Not on file  Social History Narrative   Not on file   Social Determinants of Health   Financial Resource Strain: Not on file  Food Insecurity: No Food Insecurity (11/10/2022)   Hunger Vital Sign    Worried About Running Out of Food in the Last Year: Never true  Ran Out of Food in the Last Year: Never true  Transportation Needs: No Transportation Needs (11/10/2022)   PRAPARE - Administrator, Civil Service (Medical): No    Lack of Transportation (Non-Medical): No  Physical Activity: Unknown (09/29/2021)   Received from [facility], [facility]   Exercise Vital Sign    Days of Exercise per Week: 0 days    Minutes of Exercise per Session: Not on file  Stress: Not on file  Social Connections: Not on file  Intimate Partner Violence: Not At Risk (11/10/2022)   Humiliation, Afraid, Rape, and Kick questionnaire    Fear of Current or Ex-Partner: No    Emotionally Abused: No    Physically Abused: No    Sexually Abused: No     Review of Systems: See HPI, otherwise negative ROS  Physical Exam: BP (!) 146/78   Pulse (!) 59   Temp 98.1 F (36.7 C) (Temporal)   Resp 10   Ht 5\' 10"  (1.778 m)   Wt 54.4 kg   SpO2 99%   BMI 17.22 kg/m  General:   Alert, cooperative in NAD Head:  Normocephalic and atraumatic. Respiratory:  Normal work of breathing. Cardiovascular:  RRR  Impression/Plan: Jeffrey Rojas is here for cataract surgery.  Risks, benefits, limitations, and alternatives regarding cataract surgery have been reviewed with the patient.  Questions have been answered.  All parties agreeable.   Estanislado Pandy, MD  05/30/2023, 7:02 AM

## 2023-05-30 NOTE — Anesthesia Postprocedure Evaluation (Signed)
Anesthesia Post Note  Patient: Jeffrey Rojas  Procedure(s) Performed: CATARACT EXTRACTION PHACO AND INTRAOCULAR LENS PLACEMENT (IOC) LEFT DIABETIC (Left: Eye)  Patient location during evaluation: PACU Anesthesia Type: MAC Level of consciousness: awake and alert Pain management: pain level controlled Vital Signs Assessment: post-procedure vital signs reviewed and stable Respiratory status: spontaneous breathing, nonlabored ventilation, respiratory function stable and patient connected to nasal cannula oxygen Cardiovascular status: stable and blood pressure returned to baseline Postop Assessment: no apparent nausea or vomiting Anesthetic complications: no   No notable events documented.   Last Vitals:  Vitals:   05/30/23 0809 05/30/23 0812  BP: (!) 149/76   Pulse: 70 61  Resp: 17 18  Temp:    SpO2: 99% 99%    Last Pain:  Vitals:   05/30/23 0812  TempSrc:   PainSc: 0-No pain                 Marisue Humble

## 2023-05-30 NOTE — Transfer of Care (Signed)
Immediate Anesthesia Transfer of Care Note  Patient: Jeffrey Rojas  Procedure(s) Performed: CATARACT EXTRACTION PHACO AND INTRAOCULAR LENS PLACEMENT (IOC) LEFT DIABETIC (Left: Eye)  Patient Location: PACU  Anesthesia Type: MAC  Level of Consciousness: awake, alert  and patient cooperative  Airway and Oxygen Therapy: Patient Spontanous Breathing and Patient connected to supplemental oxygen  Post-op Assessment: Post-op Vital signs reviewed, Patient's Cardiovascular Status Stable, Respiratory Function Stable, Patent Airway and No signs of Nausea or vomiting  Post-op Vital Signs: Reviewed and stable  Complications: No notable events documented.

## 2023-06-01 ENCOUNTER — Encounter: Payer: Self-pay | Admitting: Ophthalmology

## 2023-07-23 ENCOUNTER — Encounter: Payer: Self-pay | Admitting: Ophthalmology

## 2023-08-01 NOTE — Discharge Instructions (Signed)

## 2023-08-02 ENCOUNTER — Encounter: Payer: Self-pay | Admitting: Ophthalmology

## 2023-08-02 NOTE — H&P (Signed)
[facility]   Primary Care Physician:  Gracelyn Nurse, MD Ophthalmologist: Dr. Deberah Pelton  Pre-Procedure History & Physical: HPI:  Jeffrey Rojas is a 89 y.o. male here for cataract surgery.   Past Medical History:  Diagnosis Date   Arthritis    Cancer [facility])    History of prostate cancer    Hyperlipidemia    Hypertension    PAF (paroxysmal atrial fibrillation) (HCC)    Stroke (HCC) 07/2017   Type 2 diabetes mellitus [facility])     Past Surgical History:  Procedure Laterality Date   ANKLE SURGERY     CATARACT EXTRACTION W/PHACO Left 05/30/2023   Procedure: CATARACT EXTRACTION PHACO AND INTRAOCULAR LENS PLACEMENT (IOC) LEFT DIABETIC;  Surgeon: Estanislado Pandy, MD;  Location: [facility]GERY [facility]TR;  Service: Ophthalmology;  Laterality: Left;  13.89 1:14.3   COLONOSCOPY WITH ESOPHAGOGASTRODUODENOSCOPY (EGD)  11/24/2010   LOOP RECORDER INSERTION N/A 07/11/2017   Procedure: LOOP RECORDER INSERTION;  Surgeon: Regan Lemming, MD;  [facility] INV[facility] CV LAB;  Service: Cardiovascular;  Laterality: N/A;   PROSTATECTOMY     TEE WITHOUT CARDIOVERSION N/A 07/11/2017   Procedure: TRANSESOPHAGEAL ECHOCARDIOGRAM (TEE);  Surgeon: Elease Hashimoto Deloris Ping, MD;  Location: [facility] ENDOSCOPY;  Service: Cardiovascular;  Laterality: N/A;    Prior to Admission medications   Medication Sig Start Date End Date Taking? Authorizing Provider  apixaban (ELIQUIS) 2.5 MG TABS tablet Take 1 tablet (2.5 mg total) by mouth 2 (two) times daily. Patient taking differently: Take 5 mg by mouth 2 (two) times daily. 04/18/23   Duke Salvia, MD  atorvastatin (LIPITOR) 20 MG tablet Take 1 tablet (20 mg total) by mouth daily at 6 PM. Patient taking differently: Take 20 mg by mouth daily. 09/12/17   Ihor Austin, NP  busPIRone (BUSPAR) 5 MG tablet Take 5 mg by mouth 2 (two) times daily.    [provider]  hydrochlorothiazide (HYDRODIURIL) 25 MG tablet Take 25 mg by mouth daily.    [provider]  metFORMIN (GLUCOPHAGE) 1000 MG tablet Take 1 tablet by mouth 2 (two) times daily with a meal. 10/07/20   [provider]  nitroGLYCERIN (NITROSTAT) 0.4 MG SL tablet Place 1 tablet (0.4 mg total) under the tongue every 5 (five) minutes as needed for chest pain (notify PCP if experiencing chest pain and need to take Nitroglycerin). 07/11/17   Beryl Meager, NP  venlafaxine XR (EFFEXOR-XR) 150 MG 24 hr capsule Take 150 mg by mouth daily. 04/20/21   [provider]    Allergies as of 04/30/2023   (No Known Allergies)    Family History  Problem Relation Age of Onset   Prostate cancer Neg Hx    Kidney cancer Neg Hx     Social History   Socioeconomic History   Marital status: Widowed    Spouse name: Not on file   Number of children: Not on file   Years of education: Not on file   Highest education level: Not on file  Occupational History   Not on file  Tobacco Use   Smoking status: Never   Smokeless tobacco: Never  Vaping Use   Vaping status: Never Used  Substance and Sexual Activity   Alcohol use: Not Currently    Comment: approx 1 drink every 6 months   Drug use: No   Sexual activity: Not on file  Other Topics Concern   Not on file  Social History Narrative   Not on file  Social Drivers of Corporate investment banker Strain: Not on file  Food Insecurity: No Food Insecurity (11/10/2022)   Hunger Vital Sign    Worried About Running Out of Food in the Last Year: Never true    Ran Out of Food in the Last Year: Never true  Transportation Needs: No Transportation Needs (11/10/2022)   PRAPARE - Administrator, Civil Service (Medical): No    Lack of Transportation (Non-Medical): No  Physical Activity: Unknown (09/29/2021)   Received from [facility], [facility]   Exercise Vital Sign    Days of Exercise per Week: 0 days    Minutes of Exercise per Session: Not on file  Stress: Not on file  Social  Connections: Not on file  Intimate Partner Violence: Not At Risk (11/10/2022)   Humiliation, Afraid, Rape, and Kick questionnaire    Fear of Current or Ex-Partner: No    Emotionally Abused: No    Physically Abused: No    Sexually Abused: No    Review of Systems: See HPI, otherwise negative ROS  Physical Exam: Ht 5\' 10"  (1.778 m)   Wt 54.4 kg   BMI 17.22 kg/m  General:   Alert, cooperative in NAD Head:  Normocephalic and atraumatic. Respiratory:  Normal work of breathing. Cardiovascular:  RRR  Impression/Plan: Jeffrey Rojas is here for cataract surgery.  Risks, benefits, limitations, and alternatives regarding cataract surgery have been reviewed with the patient.  Questions have been answered.  All parties agreeable.   Estanislado Pandy, MD  08/02/2023, 7:09 AM

## 2023-08-30 ENCOUNTER — Ambulatory Visit: Payer: Medicare Other | Admitting: Anesthesiology

## 2023-08-30 ENCOUNTER — Other Ambulatory Visit: Payer: Self-pay

## 2023-08-30 ENCOUNTER — Ambulatory Visit
Admission: RE | Admit: 2023-08-30 | Discharge: 2023-08-30 | Disposition: A | Discharge status: Home / Self Care | Payer: Medicare Other | Attending: Ophthalmology | Admitting: Ophthalmology

## 2023-08-30 ENCOUNTER — Encounter: Payer: Self-pay | Admitting: Ophthalmology

## 2023-08-30 ENCOUNTER — Encounter
Admission: RE | Disposition: A | Discharge status: Home / Self Care | Payer: Self-pay | Source: Home / Self Care | Attending: Ophthalmology

## 2023-08-30 DIAGNOSIS — I10 Essential (primary) hypertension: Secondary | ICD-10-CM | POA: Diagnosis not present

## 2023-08-30 DIAGNOSIS — Z7984 Long term (current) use of oral hypoglycemic drugs: Secondary | ICD-10-CM | POA: Insufficient documentation

## 2023-08-30 DIAGNOSIS — F32A Depression, unspecified: Secondary | ICD-10-CM | POA: Diagnosis not present

## 2023-08-30 DIAGNOSIS — H2511 Age-related nuclear cataract, right eye: Secondary | ICD-10-CM | POA: Diagnosis present

## 2023-08-30 DIAGNOSIS — Z8673 Personal history of transient ischemic attack (TIA), and cerebral infarction without residual deficits: Secondary | ICD-10-CM | POA: Insufficient documentation

## 2023-08-30 DIAGNOSIS — E1136 Type 2 diabetes mellitus with diabetic cataract: Secondary | ICD-10-CM | POA: Insufficient documentation

## 2023-08-30 HISTORY — PX: CATARACT EXTRACTION W/PHACO: SHX586

## 2023-08-30 LAB — GLUCOSE, CAPILLARY: Glucose-Capillary: 100 mg/dL — ABNORMAL HIGH (ref 70–99)

## 2023-08-30 SURGERY — PHACOEMULSIFICATION, CATARACT, WITH IOL INSERTION
Anesthesia: Monitor Anesthesia Care | Site: Eye | Laterality: Right

## 2023-08-30 MED ORDER — FENTANYL CITRATE (PF) 100 MCG/2ML IJ SOLN
INTRAMUSCULAR | Status: DC | PRN
  Administered 2023-08-30: 25 ug via INTRAVENOUS

## 2023-08-30 MED ORDER — TETRACAINE HCL 0.5 % OP SOLN
1.0000 [drp] | OPHTHALMIC | Status: DC | PRN
  Administered 2023-08-30 (×3): 1 [drp] via OPHTHALMIC

## 2023-08-30 MED ORDER — EPINEPHRINE PF 1 MG/ML IJ SOLN
INTRAMUSCULAR | Status: DC | PRN
  Administered 2023-08-30: 103 mL via OPHTHALMIC

## 2023-08-30 MED ORDER — BRIMONIDINE TARTRATE-TIMOLOL 0.2-0.5 % OP SOLN
OPHTHALMIC | Status: DC | PRN
  Administered 2023-08-30: 1 [drp] via OPHTHALMIC

## 2023-08-30 MED ORDER — MOXIFLOXACIN HCL 0.5 % OP SOLN
OPHTHALMIC | Status: DC | PRN
  Administered 2023-08-30: .2 mL via OPHTHALMIC

## 2023-08-30 MED ORDER — ARMC OPHTHALMIC DILATING DROPS
1.0000 | OPHTHALMIC | Status: DC | PRN
  Administered 2023-08-30 (×3): 1 via OPHTHALMIC

## 2023-08-30 MED ORDER — SEVOFLURANE IN SOLN
RESPIRATORY_TRACT | Status: AC
  Filled 2023-08-30: qty 250

## 2023-08-30 MED ORDER — FENTANYL CITRATE (PF) 100 MCG/2ML IJ SOLN
INTRAMUSCULAR | Status: AC
  Filled 2023-08-30: qty 2

## 2023-08-30 MED ORDER — LIDOCAINE HCL (PF) 2 % IJ SOLN
INTRAOCULAR | Status: DC | PRN
  Administered 2023-08-30: 1 mL via INTRAOCULAR

## 2023-08-30 MED ORDER — ARMC OPHTHALMIC DILATING DROPS
OPHTHALMIC | Status: AC
  Filled 2023-08-30: qty 0.5

## 2023-08-30 MED ORDER — SIGHTPATH DOSE#1 NA HYALUR & NA CHOND-NA HYALUR IO KIT
PACK | INTRAOCULAR | Status: DC | PRN
  Administered 2023-08-30: 1 via OPHTHALMIC

## 2023-08-30 MED ORDER — SIGHTPATH DOSE#1 BSS IO SOLN
INTRAOCULAR | Status: DC | PRN
  Administered 2023-08-30: 15 mL

## 2023-08-30 MED ORDER — TETRACAINE HCL 0.5 % OP SOLN
OPHTHALMIC | Status: AC
  Filled 2023-08-30: qty 4

## 2023-08-30 SURGICAL SUPPLY — 14 items
CATARACT SUITE SIGHTPATH (MISCELLANEOUS) ×1 IMPLANT
DISSECTOR HYDRO NUCLEUS 50X22 (MISCELLANEOUS) ×1 IMPLANT
DRSG TEGADERM 2-3/8X2-3/4 SM (GAUZE/BANDAGES/DRESSINGS) ×1 IMPLANT
FEE CATARACT SUITE SIGHTPATH (MISCELLANEOUS) ×1 IMPLANT
GLOVE BIOGEL PI IND STRL 8 (GLOVE) ×1 IMPLANT
GLOVE SURG LX STRL 7.5 STRW (GLOVE) ×1 IMPLANT
GLOVE SURG PROTEXIS BL SZ6.5 (GLOVE) ×1 IMPLANT
GLOVE SURG SYN 6.5 PF PI BL (GLOVE) ×1 IMPLANT
LENS CLAREON WAGON WHEEL 21.0 (Intraocular Lens) ×1 IMPLANT
LENS IOL CLRN WGN WHL 21.0 (Intraocular Lens) IMPLANT
NDL FILTER BLUNT 18X1 1/2 (NEEDLE) ×1 IMPLANT
NEEDLE FILTER BLUNT 18X1 1/2 (NEEDLE) ×1 IMPLANT
RING MALYGIN (MISCELLANEOUS) ×1 IMPLANT
SYR 3ML LL SCALE MARK (SYRINGE) ×1 IMPLANT

## 2023-08-30 NOTE — Anesthesia Preprocedure Evaluation (Signed)
 Anesthesia Evaluation  Patient identified by MRN, date of birth, ID band Patient awake  General Assessment Comment:Patient awake, alert, is aware of his surgery today.  Accompanied by granddaughter.   Reviewed: Allergy & Precautions, H&P , NPO status , Patient's Chart, lab work & pertinent test results  Airway Mallampati: III  TM Distance: >3 FB     Dental   Pulmonary neg pulmonary ROS          Cardiovascular hypertension, negative cardio ROS   11-10-22  1. Left ventricular ejection fraction, by estimation, is 65 to 70%. The  left ventricle has normal function. The left ventricle has no regional  wall motion abnormalities. There is mild left ventricular hypertrophy.  Left ventricular diastolic parameters  are indeterminate.   2. Right ventricular systolic function is normal. The right ventricular  size is normal. There is normal pulmonary artery systolic pressure. The  estimated right ventricular systolic pressure is 21.5 mmHg.   3. The mitral valve is normal in structure. Trivial mitral valve  regurgitation.   4. The aortic valve is tricuspid. Aortic valve regurgitation is mild to  moderate. Aortic valve sclerosis/calcification is present, without any  evidence of aortic stenosis.   5. The inferior vena cava is normal in size with greater than 50%  respiratory variability, suggesting right atrial pressure of 3 mmHg.       Neuro/Psych  PSYCHIATRIC DISORDERS  Depression    CVA negative neurological ROS  negative psych ROS   GI/Hepatic negative GI ROS, Neg liver ROS,,,  Endo/Other  negative endocrine ROSdiabetes    Renal/GU negative Renal ROS  negative genitourinary   Musculoskeletal negative musculoskeletal ROS (+) Arthritis ,    Abdominal   Peds negative pediatric ROS (+)  Hematology negative hematology ROS (+)   Anesthesia Other Findings Arthritis  History of prostate cancer Cancer (HCC) Stroke   Hypertension  PAF (paroxysmal atrial fibrillation) (HCC) Hyperlipidemia Type 2 diabetes mellitus (HCC) 05-30-23 previous cataract surgery    Reproductive/Obstetrics negative OB ROS                              Anesthesia Physical Anesthesia Plan  ASA: 3  Anesthesia Plan: MAC   Post-op Pain Management:    Induction: Intravenous  PONV Risk Score and Plan:   Airway Management Planned: Natural Airway and Nasal Cannula  Additional Equipment:   Intra-op Plan:   Post-operative Plan:   Informed Consent: I have reviewed the patients History and Physical, chart, labs and discussed the procedure including the risks, benefits and alternatives for the proposed anesthesia with the patient or authorized representative who has indicated his/her understanding and acceptance.     Dental Advisory Given  Plan Discussed with: Anesthesiologist, CRNA and Surgeon  Anesthesia Plan Comments: (Patient consented for risks of anesthesia including but not limited to:  - adverse reactions to medications - damage to eyes, teeth, lips or other oral mucosa - nerve damage due to positioning  - sore throat or hoarseness - Damage to heart, brain, nerves, lungs, other parts of body or loss of life  Patient voiced understanding and assent.)         Anesthesia Quick Evaluation

## 2023-08-30 NOTE — Transfer of Care (Signed)
 Immediate Anesthesia Transfer of Care Note  Patient: Jeffrey Rojas  Procedure(s) Performed: CATARACT EXTRACTION PHACO AND INTRAOCULAR LENS PLACEMENT (IOC) RIGHT DIABETIC (Right: Eye)  Patient Location: PACU  Anesthesia Type: MAC  Level of Consciousness: awake, alert  and patient cooperative  Airway and Oxygen Therapy: Patient Spontanous Breathing and Patient connected to supplemental oxygen  Post-op Assessment: Post-op Vital signs reviewed, Patient's Cardiovascular Status Stable, Respiratory Function Stable, Patent Airway and No signs of Nausea or vomiting  Post-op Vital Signs: Reviewed and stable  Complications: No notable events documented.

## 2023-08-30 NOTE — H&P (Signed)
 [facility]   Primary Care Physician:  Gracelyn Nurse, MD Ophthalmologist: Dr. Deberah Pelton  Pre-Procedure History & Physical: HPI:  Jeffrey Rojas is a 89 y.o. male here for cataract surgery.   Past Medical History:  Diagnosis Date   Arthritis    Cancer [facility])    History of prostate cancer    Hyperlipidemia    Hypertension    PAF (paroxysmal atrial fibrillation) (HCC)    Stroke (HCC) 07/2017   Type 2 diabetes mellitus [facility])     Past Surgical History:  Procedure Laterality Date   ANKLE SURGERY     CATARACT EXTRACTION W/PHACO Left 05/30/2023   Procedure: CATARACT EXTRACTION PHACO AND INTRAOCULAR LENS PLACEMENT (IOC) LEFT DIABETIC;  Surgeon: Estanislado Pandy, MD;  Location: [facility];  Service: Ophthalmology;  Laterality: Left;  13.89 1:14.3   COLONOSCOPY WITH ESOPHAGOGASTRODUODENOSCOPY (EGD)  11/24/2010   LOOP RECORDER INSERTION N/A 07/11/2017   Procedure: LOOP RECORDER INSERTION;  Surgeon: Regan Lemming, MD;  Location: [facility] INVASIVE CV LAB;  Service: Cardiovascular;  Laterality: N/A;   PROSTATECTOMY     TEE WITHOUT CARDIOVERSION N/A 07/11/2017   Procedure: TRANSESOPHAGEAL ECHOCARDIOGRAM (TEE);  Surgeon: Elease Hashimoto Deloris Ping, MD;  Location: [facility] ENDOSCOPY;  Service: Cardiovascular;  Laterality: N/A;    Prior to Admission medications   Medication Sig Start Date End Date Taking? Authorizing Provider  apixaban (ELIQUIS) 2.5 MG TABS tablet Take 1 tablet (2.5 mg total) by mouth 2 (two) times daily. Patient taking differently: Take 5 mg by mouth 2 (two) times daily. 04/18/23   Duke Salvia, MD  atorvastatin (LIPITOR) 20 MG tablet Take 1 tablet (20 mg total) by mouth daily at 6 PM. Patient taking differently: Take 20 mg by mouth daily. 09/12/17   Ihor Austin, NP  busPIRone (BUSPAR) 5 MG tablet Take 5 mg by mouth 2 (two) times daily.    [provider]  hydrochlorothiazide (HYDRODIURIL) 25 MG tablet Take 25 mg by mouth daily.    [provider]  metFORMIN (GLUCOPHAGE) 1000 MG tablet Take 1 tablet by mouth 2 (two) times daily with a meal. 10/07/20   [provider]  nitroGLYCERIN (NITROSTAT) 0.4 MG SL tablet Place 1 tablet (0.4 mg total) under the tongue every 5 (five) minutes as needed for chest pain (notify PCP if experiencing chest pain and need to take Nitroglycerin). 07/11/17   Beryl Meager, NP  venlafaxine XR (EFFEXOR-XR) 150 MG 24 hr capsule Take 150 mg by mouth daily. 04/20/21   [provider]    Allergies as of 04/30/2023   (No Known Allergies)    Family History  Problem Relation Age of Onset   Prostate cancer Neg Hx    Kidney cancer Neg Hx     Social History   Socioeconomic History   Marital status: Widowed    Spouse name: Not on file   Number of children: Not on file   Years of education: Not on file   Highest education level: Not on file  Occupational History   Not on file  Tobacco Use   Smoking status: Never   Smokeless tobacco: Never  Vaping Use   Vaping status: Never Used  Substance and Sexual Activity   Alcohol use: Not Currently    Comment: approx 1 drink every 6 months   Drug use: No   Sexual activity: Not on file  Other Topics Concern   Not on file  Social History Narrative   Not on file  Social Drivers of Corporate investment banker Strain: Not on file  Food Insecurity: No Food Insecurity (11/10/2022)   Hunger Vital Sign    Worried About Running Out of Food in the Last Year: Never true    Ran Out of Food in the Last Year: Never true  Transportation Needs: No Transportation Needs (11/10/2022)   PRAPARE - Administrator, Civil Service (Medical): No    Lack of Transportation (Non-Medical): No  Physical Activity: Unknown (09/29/2021)   Received from [facility], [facility]   Exercise Vital Sign    Days of Exercise per Week: 0 days    Minutes of Exercise per Session: Not on file  Stress: Not on file  Social  Connections: Not on file  Intimate Partner Violence: Not At Risk (11/10/2022)   Humiliation, Afraid, Rape, and Kick questionnaire    Fear of Current or Ex-Partner: No    Emotionally Abused: No    Physically Abused: No    Sexually Abused: No    Review of Systems: See HPI, otherwise negative ROS  Physical Exam: Ht 5\' 10"  (1.778 m)   Wt 54.4 kg   BMI 17.22 kg/m  General:   Alert, cooperative in NAD Head:  Normocephalic and atraumatic. Respiratory:  Normal work of breathing. Cardiovascular:  RRR  Impression/Plan: Jeffrey Rojas is here for cataract surgery.  Risks, benefits, limitations, and alternatives regarding cataract surgery have been reviewed with the patient.  Questions have been answered.  All parties agreeable.   Estanislado Pandy, MD  08/30/2023, 7:07 AM

## 2023-08-30 NOTE — Op Note (Signed)
 OPERATIVE NOTE  Jeffrey Rojas [id number] 08/30/2023   PREOPERATIVE DIAGNOSIS: Nuclear sclerotic cataract right eye. H25.11   POSTOPERATIVE DIAGNOSIS: Nuclear sclerotic cataract right eye. H25.11   PROCEDURE:  Phacoemusification with posterior chamber intraocular lens placement of the right eye  Ultrasound time: Procedure(s) with comments: CATARACT EXTRACTION PHACO AND INTRAOCULAR LENS PLACEMENT (IOC) RIGHT DIABETIC (Right) - 22.18 1:44.9  LENS:   Implant Name Type Inv. Item Serial No. Manufacturer Lot No. LRB No. Used Action  LENS [employer] WAGON WHEEL 21.0 - [id number] Intraocular Lens LENS [employer] WAGON WHEEL 21.0 [id number] [employer]  Right 1 Implanted      SURGEON:  Julious Payer. Rolley Sims, MD   ANESTHESIA:  Topical with tetracaine drops, augmented with 1% preservative-free intracameral lidocaine.   COMPLICATIONS:  None.   DESCRIPTION OF PROCEDURE:  The patient was identified in the holding room and transported to the operating room and placed in the supine position under the operating microscope.  The right eye was identified as the operative eye, which was prepped and draped in the usual sterile ophthalmic fashion.   A 1 millimeter clear-corneal paracentesis was made superotemporally. Preservative-free 1% lidocaine mixed with 1:1,000 bisulfite-free aqueous solution of epinephrine was injected into the anterior chamber. The anterior chamber was then filled with Viscoat viscoelastic. A 2.4 millimeter keratome was used to make a clear-corneal incision inferotemporally. A curvilinear capsulorrhexis was made with a cystotome and capsulorrhexis forceps. Balanced salt solution was used to hydrodissect and hydrodelineate the nucleus. Phacoemulsification was then used to remove the lens nucleus and epinucleus. The remaining cortex was then removed using the irrigation and aspiration handpiece. Provisc was then placed into the capsular bag to distend it for lens placement. A +21.00 D [id number]  intraocular lens was then injected into the capsular bag. The remaining viscoelastic was aspirated.   Wounds were hydrated with balanced salt solution.  The anterior chamber was inflated to a physiologic pressure with balanced salt solution.  No wound leaks were noted. Moxifloxacin was injected intracamerally.  Timolol and Brimonidine drops were applied to the eye.  The patient was taken to the recovery room in stable condition without complications of anesthesia or surgery.  [name] 08/30/2023, 12:17 PM

## 2023-08-31 ENCOUNTER — Encounter: Payer: Self-pay | Admitting: Ophthalmology

## 2023-08-31 NOTE — Anesthesia Postprocedure Evaluation (Signed)
 Anesthesia Post Note  Patient: Jeffrey Rojas  Procedure(s) Performed: CATARACT EXTRACTION PHACO AND INTRAOCULAR LENS PLACEMENT (IOC) RIGHT DIABETIC (Right: Eye)  Patient location during evaluation: PACU Anesthesia Type: MAC Level of consciousness: awake and alert Pain management: pain level controlled Vital Signs Assessment: post-procedure vital signs reviewed and stable Respiratory status: spontaneous breathing, nonlabored ventilation, respiratory function stable and patient connected to nasal cannula oxygen Cardiovascular status: stable and blood pressure returned to baseline Postop Assessment: no apparent nausea or vomiting Anesthetic complications: no   No notable events documented.   Last Vitals:  Vitals:   08/30/23 1218 08/30/23 1223  BP: (!) 149/71 (!) 155/79  Pulse: 65 63  Resp: 12 11  Temp: (!) 36.1 C 36.4 C  SpO2: 99% 98%    Last Pain:  Vitals:   08/31/23 1103  TempSrc:   PainSc: 0-No pain                 Renlee Floor C Jim Philemon

## 2023-09-09 ENCOUNTER — Other Ambulatory Visit: Payer: Self-pay | Admitting: Internal Medicine

## 2023-09-09 DIAGNOSIS — I480 Paroxysmal atrial fibrillation: Secondary | ICD-10-CM

## 2023-09-10 NOTE — Telephone Encounter (Signed)
 Prescription refill request for Eliquis received. Indication:afib Last office visit:needs office visit Scr:0.99  10/24 Age: 89 Weight:54.9  kg  Prescription refilled

## 2023-10-26 ENCOUNTER — Emergency Department

## 2023-10-26 ENCOUNTER — Other Ambulatory Visit: Payer: Self-pay

## 2023-10-26 ENCOUNTER — Encounter: Payer: Self-pay | Admitting: Emergency Medicine

## 2023-10-26 ENCOUNTER — Inpatient Hospital Stay
Admission: EM | Admit: 2023-10-26 | Discharge: 2023-10-28 | DRG: 947 | Disposition: A | Discharge status: Home Health | Attending: Internal Medicine | Admitting: Internal Medicine

## 2023-10-26 DIAGNOSIS — S270XXA Traumatic pneumothorax, initial encounter: Secondary | ICD-10-CM | POA: Diagnosis not present

## 2023-10-26 DIAGNOSIS — S80212A Abrasion, left knee, initial encounter: Secondary | ICD-10-CM | POA: Diagnosis present

## 2023-10-26 DIAGNOSIS — T402X5A Adverse effect of other opioids, initial encounter: Secondary | ICD-10-CM | POA: Diagnosis present

## 2023-10-26 DIAGNOSIS — J181 Lobar pneumonia, unspecified organism: Secondary | ICD-10-CM

## 2023-10-26 DIAGNOSIS — Z7901 Long term (current) use of anticoagulants: Secondary | ICD-10-CM

## 2023-10-26 DIAGNOSIS — Y92009 Unspecified place in unspecified non-institutional (private) residence as the place of occurrence of the external cause: Secondary | ICD-10-CM

## 2023-10-26 DIAGNOSIS — F0390 Unspecified dementia without behavioral disturbance: Secondary | ICD-10-CM | POA: Insufficient documentation

## 2023-10-26 DIAGNOSIS — S60512A Abrasion of left hand, initial encounter: Secondary | ICD-10-CM | POA: Diagnosis present

## 2023-10-26 DIAGNOSIS — S80211A Abrasion, right knee, initial encounter: Secondary | ICD-10-CM | POA: Diagnosis present

## 2023-10-26 DIAGNOSIS — Z681 Body mass index (BMI) 19 or less, adult: Secondary | ICD-10-CM

## 2023-10-26 DIAGNOSIS — E119 Type 2 diabetes mellitus without complications: Secondary | ICD-10-CM

## 2023-10-26 DIAGNOSIS — R636 Underweight: Secondary | ICD-10-CM

## 2023-10-26 DIAGNOSIS — R5383 Other fatigue: Secondary | ICD-10-CM | POA: Diagnosis not present

## 2023-10-26 DIAGNOSIS — J939 Pneumothorax, unspecified: Principal | ICD-10-CM | POA: Diagnosis present

## 2023-10-26 DIAGNOSIS — D649 Anemia, unspecified: Secondary | ICD-10-CM

## 2023-10-26 DIAGNOSIS — D509 Iron deficiency anemia, unspecified: Secondary | ICD-10-CM | POA: Insufficient documentation

## 2023-10-26 DIAGNOSIS — E785 Hyperlipidemia, unspecified: Secondary | ICD-10-CM | POA: Diagnosis present

## 2023-10-26 DIAGNOSIS — R2681 Unsteadiness on feet: Secondary | ICD-10-CM | POA: Diagnosis present

## 2023-10-26 DIAGNOSIS — I10 Essential (primary) hypertension: Secondary | ICD-10-CM | POA: Diagnosis present

## 2023-10-26 DIAGNOSIS — Z8546 Personal history of malignant neoplasm of prostate: Secondary | ICD-10-CM

## 2023-10-26 DIAGNOSIS — W1830XA Fall on same level, unspecified, initial encounter: Secondary | ICD-10-CM | POA: Diagnosis present

## 2023-10-26 DIAGNOSIS — Z8673 Personal history of transient ischemic attack (TIA), and cerebral infarction without residual deficits: Secondary | ICD-10-CM

## 2023-10-26 DIAGNOSIS — Z7984 Long term (current) use of oral hypoglycemic drugs: Secondary | ICD-10-CM

## 2023-10-26 DIAGNOSIS — W19XXXD Unspecified fall, subsequent encounter: Secondary | ICD-10-CM

## 2023-10-26 DIAGNOSIS — Z79899 Other long term (current) drug therapy: Secondary | ICD-10-CM

## 2023-10-26 DIAGNOSIS — Z9181 History of falling: Secondary | ICD-10-CM

## 2023-10-26 DIAGNOSIS — I480 Paroxysmal atrial fibrillation: Secondary | ICD-10-CM | POA: Diagnosis present

## 2023-10-26 DIAGNOSIS — Z602 Problems related to living alone: Secondary | ICD-10-CM | POA: Diagnosis present

## 2023-10-26 LAB — URINALYSIS, ROUTINE W REFLEX MICROSCOPIC
Bilirubin Urine: NEGATIVE
Glucose, UA: NEGATIVE mg/dL
Hgb urine dipstick: NEGATIVE
Ketones, ur: NEGATIVE mg/dL
Leukocytes,Ua: NEGATIVE
Nitrite: NEGATIVE
Protein, ur: NEGATIVE mg/dL
Specific Gravity, Urine: 1.008 (ref 1.005–1.030)
pH: 7 (ref 5.0–8.0)

## 2023-10-26 LAB — CBC WITH DIFFERENTIAL/PLATELET
Abs Immature Granulocytes: 0.03 10*3/uL (ref 0.00–0.07)
Basophils Absolute: 0 10*3/uL (ref 0.0–0.1)
Basophils Relative: 0 %
Eosinophils Absolute: 0 10*3/uL (ref 0.0–0.5)
Eosinophils Relative: 0 %
HCT: 39.2 % (ref 39.0–52.0)
Hemoglobin: 12.9 g/dL — ABNORMAL LOW (ref 13.0–17.0)
Immature Granulocytes: 0 %
Lymphocytes Relative: 19 %
Lymphs Abs: 1.9 10*3/uL (ref 0.7–4.0)
MCH: 29.8 pg (ref 26.0–34.0)
MCHC: 32.9 g/dL (ref 30.0–36.0)
MCV: 90.5 fL (ref 80.0–100.0)
Monocytes Absolute: 0.9 10*3/uL (ref 0.1–1.0)
Monocytes Relative: 9 %
Neutro Abs: 7.1 10*3/uL (ref 1.7–7.7)
Neutrophils Relative %: 72 %
Platelets: 242 10*3/uL (ref 150–400)
RBC: 4.33 MIL/uL (ref 4.22–5.81)
RDW: 13.6 % (ref 11.5–15.5)
WBC: 9.9 10*3/uL (ref 4.0–10.5)
nRBC: 0 % (ref 0.0–0.2)

## 2023-10-26 LAB — RETICULOCYTES
Immature Retic Fract: 11.8 % (ref 2.3–15.9)
RBC.: 4.36 MIL/uL (ref 4.22–5.81)
Retic Count, Absolute: 65.8 10*3/uL (ref 19.0–186.0)
Retic Ct Pct: 1.5 % (ref 0.4–3.1)

## 2023-10-26 LAB — TROPONIN I (HIGH SENSITIVITY)
Troponin I (High Sensitivity): 5 ng/L (ref <18)
Troponin I (High Sensitivity): 6 ng/L (ref <18)

## 2023-10-26 LAB — BASIC METABOLIC PANEL WITH GFR
Anion gap: 8 (ref 5–15)
BUN: 23 mg/dL (ref 8–23)
CO2: 28 mmol/L (ref 22–32)
Calcium: 9.1 mg/dL (ref 8.9–10.3)
Chloride: 98 mmol/L (ref 98–111)
Creatinine, Ser: 1.05 mg/dL (ref 0.61–1.24)
GFR, Estimated: >60 mL/min (ref >60)
Glucose, Bld: 130 mg/dL — ABNORMAL HIGH (ref 70–99)
Potassium: 4.1 mmol/L (ref 3.5–5.1)
Sodium: 134 mmol/L — ABNORMAL LOW (ref 135–145)

## 2023-10-26 MED ORDER — MORPHINE SULFATE (PF) 2 MG/ML IV SOLN: 2.0000 mg | INTRAVENOUS | Status: DC | PRN

## 2023-10-26 MED ORDER — HYDRALAZINE HCL 20 MG/ML IJ SOLN: 5.0000 mg | Freq: Four times a day (QID) | INTRAMUSCULAR | Status: DC | PRN

## 2023-10-26 MED ORDER — METHOCARBAMOL 1000 MG/10ML IJ SOLN: 500.0000 mg | Freq: Four times a day (QID) | INTRAMUSCULAR | Status: DC | PRN

## 2023-10-26 MED ORDER — ACETAMINOPHEN 325 MG PO TABS: 650.0000 mg | ORAL_TABLET | Freq: Four times a day (QID) | ORAL | Status: DC | PRN

## 2023-10-26 MED ORDER — APIXABAN 2.5 MG PO TABS
2.5000 mg | ORAL_TABLET | Freq: Two times a day (BID) | ORAL | Status: DC
  Administered 2023-10-27 – 2023-10-28 (×4): 2.5 mg via ORAL
  Filled 2023-10-26 (×4): qty 1

## 2023-10-26 MED ORDER — ATORVASTATIN CALCIUM 20 MG PO TABS
20.0000 mg | ORAL_TABLET | Freq: Every day | ORAL | Status: DC
  Administered 2023-10-27 – 2023-10-28 (×2): 20 mg via ORAL
  Filled 2023-10-26 (×2): qty 1

## 2023-10-26 MED ORDER — GUAIFENESIN ER 600 MG PO TB12: 600.0000 mg | ORAL_TABLET | Freq: Two times a day (BID) | ORAL | Status: DC | PRN

## 2023-10-26 MED ORDER — ONDANSETRON HCL 4 MG/2ML IJ SOLN: 4.0000 mg | Freq: Four times a day (QID) | INTRAMUSCULAR | Status: DC | PRN

## 2023-10-26 MED ORDER — OXYCODONE HCL 5 MG PO TABS
5.0000 mg | ORAL_TABLET | ORAL | Status: DC | PRN
  Administered 2023-10-27: 5 mg via ORAL
  Filled 2023-10-26: qty 1

## 2023-10-26 MED ORDER — ACETAMINOPHEN 650 MG RE SUPP
650.0000 mg | Freq: Four times a day (QID) | RECTAL | Status: DC | PRN
Start: 2023-10-26 — End: 2023-10-28

## 2023-10-26 MED ORDER — INSULIN ASPART 100 UNIT/ML IJ SOLN: 0.0000 [IU] | Freq: Every day | INTRAMUSCULAR | Status: DC

## 2023-10-26 MED ORDER — ONDANSETRON HCL 4 MG PO TABS: 4.0000 mg | ORAL_TABLET | Freq: Four times a day (QID) | ORAL | Status: DC | PRN

## 2023-10-26 MED ORDER — INSULIN ASPART 100 UNIT/ML IJ SOLN
0.0000 [IU] | Freq: Three times a day (TID) | INTRAMUSCULAR | Status: DC
  Administered 2023-10-28: 2 [IU] via SUBCUTANEOUS
  Filled 2023-10-26: qty 1

## 2023-10-26 NOTE — H&P (Signed)
 History and Physical    Patient: Jeffrey Rojas GMW:[id number] DOB: 01-24-1935 DOA: 10/26/2023 DOS: the patient was seen and examined on 10/26/2023 PCP: Little Riff, MD  Patient coming from: Home  Chief Complaint:  Chief Complaint  Patient presents with   Fall on Eliquis     HPI: [name] is a 89 y.o. male with medical history significant for PAF on Eliquis , history of CVA, T2DM, HTN, dementia, prostate cancer, baseline unsteady gait with prior falls who lives alone and has a daily caregiver for which he was hospitalized in May 2024 with an negative syncope workup, being admitted to observation for 5% apical pneumothorax resulting from an unwitnessed fall.  Patient fell onto his left side sustaining abrasions on bilateral knees and left hand.  He was previously in his usual state of health. History as given by daytime caregiver at bedside who says patient lives alone says she had just left the home when she was called to go back as patient had fallen outside. She said he told her he was going to pay bills. She is uncertain of the circumstances but said she does not think he was able to get up on his own. ED course and data review: Vitals within normal limits Blood work including troponin BMP, CBC, UA unremarkableExcept for mild anemia of 12.9, down from 15.6 about 6 months prior EKG, personally viewed and interpreted showing NSR at 78 with no concerning ST-T wave changes Trauma imaging included CT head and C-spine and CT chest as well as x-rays of the left hand and left rib series. Notable finding of trauma imaging includes a trace left apical pneumothorax.  Patient was placed on NRB.  Hospitalist consulted for admission     Past Medical History:  Diagnosis Date   Arthritis    Cancer [facility])    History of prostate cancer    Hyperlipidemia    Hypertension    PAF (paroxysmal atrial fibrillation) (HCC)    Stroke (HCC) 07/2017   Type 2 diabetes mellitus (HCC)    Past Surgical  History:  Procedure Laterality Date   ANKLE SURGERY     CATARACT EXTRACTION W/PHACO Left 05/30/2023   Procedure: CATARACT EXTRACTION PHACO AND INTRAOCULAR LENS PLACEMENT (IOC) LEFT DIABETIC;  Surgeon: Trudi Fus, MD;  Location: [facility];  Service: Ophthalmology;  Laterality: Left;  13.89 1:14.3   CATARACT EXTRACTION W/PHACO Right 08/30/2023   Procedure: CATARACT EXTRACTION PHACO AND INTRAOCULAR LENS PLACEMENT (IOC) RIGHT DIABETIC;  Surgeon: Trudi Fus, MD;  Location: [facility];  Service: Ophthalmology;  Laterality: Right;  22.18 1:44.9   COLONOSCOPY WITH ESOPHAGOGASTRODUODENOSCOPY (EGD)  11/24/2010   LOOP RECORDER INSERTION N/A 07/11/2017   Procedure: LOOP RECORDER INSERTION;  Surgeon: Lei Pump, MD;  Location: [facility];  Service: Cardiovascular;  Laterality: N/A;   PROSTATECTOMY     TEE WITHOUT CARDIOVERSION N/A 07/11/2017   Procedure: TRANSESOPHAGEAL ECHOCARDIOGRAM (TEE);  Surgeon: Alroy Aspen Lela Purple, MD;  Location: [facility];  Service: Cardiovascular;  Laterality: N/A;   Social History:  reports that he has never smoked. He has never used smokeless tobacco. He reports that he does not currently use alcohol. He reports that he does not use drugs.  No Known Allergies  Family History  Problem Relation Age of Onset   Prostate cancer Neg Hx    Kidney cancer Neg Hx     Prior to Admission medications   Medication Sig Start Date End Date Taking? Authorizing Provider  apixaban  (ELIQUIS ) 2.5 MG  TABS tablet Take 1 tablet (2.5 mg total) by mouth 2 (two) times daily. NEEDS CARDIOLOGY VISIT FOR ELIQUIS  REFILLS, CALL OFFICE.  THANK YOU 09/10/23   Verona Goodwill, MD  atorvastatin  (LIPITOR) 20 MG tablet Take 1 tablet (20 mg total) by mouth daily at 6 PM. Patient taking differently: Take 20 mg by mouth daily. 09/12/17   Johny Nap, NP  busPIRone  (BUSPAR ) 5 MG tablet Take 5 mg by mouth 2 (two) times daily.    [provider]   hydrochlorothiazide (HYDRODIURIL) 25 MG tablet Take 25 mg by mouth daily.    [provider]  metFORMIN (GLUCOPHAGE) 1000 MG tablet Take 1 tablet by mouth 2 (two) times daily with a meal. 10/07/20   [provider]  nitroGLYCERIN  (NITROSTAT ) 0.4 MG SL tablet Place 1 tablet (0.4 mg total) under the tongue every 5 (five) minutes as needed for chest pain (notify PCP if experiencing chest pain and need to take Nitroglycerin ). 07/11/17   Nichole Barker, NP  venlafaxine  XR (EFFEXOR -XR) 150 MG 24 hr capsule Take 150 mg by mouth daily. 04/20/21   [provider]    Physical Exam: Vitals:   10/26/23 1920 10/26/23 1921 10/26/23 2118  BP: (!) 146/82  (!) 157/83  Pulse: 80  80  Resp: 20  (!) 22  Temp: (!) 97.5 F (36.4 C)    TempSrc: Oral    SpO2: 96%  99%  Weight:  54.9 kg    Physical Exam Vitals and nursing note reviewed.  Constitutional:      General: He is not in acute distress. HENT:     Head: Normocephalic and atraumatic.  Cardiovascular:     Rate and Rhythm: Normal rate and regular rhythm.     Heart sounds: Normal heart sounds.  Pulmonary:     Effort: Pulmonary effort is normal.     Breath sounds: Normal breath sounds.  Abdominal:     Palpations: Abdomen is soft.     Tenderness: There is no abdominal tenderness.  Skin:    Comments: Abrasions, superficial over bilateral knees and the left wrist  Neurological:     Mental Status: Mental status is at baseline.     Labs on Admission: I have personally reviewed following labs and imaging studies  CBC: Recent Labs  Lab 10/26/23 2014  WBC 9.9  NEUTROABS 7.1  HGB 12.9*  HCT 39.2  MCV 90.5  PLT 242   Basic Metabolic Panel: Recent Labs  Lab 10/26/23 2014  NA 134*  K 4.1  CL 98  CO2 28  GLUCOSE 130*  BUN 23  CREATININE 1.05  CALCIUM  9.1   GFR: Estimated Creatinine Clearance: 39.2 mL/min (by C-G formula based on SCr of 1.05 mg/dL). Liver Function Tests: No results for input(s): "AST",  "ALT", "ALKPHOS", "BILITOT", "PROT", "ALBUMIN" in the last 168 hours. No results for input(s): "LIPASE", "AMYLASE" in the last 168 hours. No results for input(s): "AMMONIA" in the last 168 hours. Coagulation Profile: No results for input(s): "INR", "PROTIME" in the last 168 hours. Cardiac Enzymes: No results for input(s): "CKTOTAL", "CKMB", "CKMBINDEX", "TROPONINI" in the last 168 hours. BNP (last 3 results) No results for input(s): "PROBNP" in the last 8760 hours. HbA1C: No results for input(s): "HGBA1C" in the last 72 hours. CBG: No results for input(s): "GLUCAP" in the last 168 hours. Lipid Profile: No results for input(s): "CHOL", "HDL", "LDLCALC", "TRIG", "CHOLHDL", "LDLDIRECT" in the last 72 hours. Thyroid Function Tests: No results for input(s): "TSH", "T4TOTAL", "FREET4", "T3FREE", "THYROIDAB"  in the last 72 hours. Anemia Panel: No results for input(s): "VITAMINB12", "FOLATE", "FERRITIN", "TIBC", "IRON", "RETICCTPCT" in the last 72 hours. Urine analysis:    Component Value Date/Time   COLORURINE STRAW (A) 10/26/2023 2209   APPEARANCEUR CLEAR (A) 10/26/2023 2209   LABSPEC 1.008 10/26/2023 2209   PHURINE 7.0 10/26/2023 2209   GLUCOSEU NEGATIVE 10/26/2023 2209   HGBUR NEGATIVE 10/26/2023 2209   BILIRUBINUR NEGATIVE 10/26/2023 2209   KETONESUR NEGATIVE 10/26/2023 2209   PROTEINUR NEGATIVE 10/26/2023 2209   NITRITE NEGATIVE 10/26/2023 2209   LEUKOCYTESUR NEGATIVE 10/26/2023 2209    Radiological Exams on Admission: DG Ribs Unilateral W/Chest Left Addendum Date: 10/26/2023 ADDENDUM REPORT: 10/26/2023 20:57 ADDENDUM: Trace left apical pneumothorax better evaluated on CT C-spine 10/26/2023. Electronically Signed   By: Morgane  Naveau M.D.   On: 10/26/2023 20:57   Result Date: 10/26/2023 CLINICAL DATA:  fall, pain EXAM: LEFT RIBS AND CHEST - 3+ VIEW COMPARISON:  Chest x-ray 11/09/2022 FINDINGS: Wireless cardiac device overlies left chest. The heart and mediastinal contours are  within normal limits. Atherosclerotic plaque. Left base atelectasis. No focal consolidation. No pulmonary edema. No pleural effusion. No pneumothorax. No acute displaced fracture or other bone lesions are seen involving the left ribs. No acute osseous abnormality. IMPRESSION: 1. No acute displaced left rib fractures. Please note, nondisplaced rib fractures may be occult on radiograph. 2. No acute cardiopulmonary abnormality. 3.  Aortic Atherosclerosis (ICD10-I70.0). Electronically Signed: By: Morgane  Naveau M.D. On: 10/26/2023 20:46   DG Hand Complete Left Result Date: 10/26/2023 CLINICAL DATA:  fall, pain EXAM: LEFT HAND - COMPLETE 3+ VIEW COMPARISON:  None Available. FINDINGS: There is no evidence of fracture or dislocation. There is no evidence of severe arthropathy or other focal bone abnormality. Soft tissues are unremarkable. IMPRESSION: No acute displaced fracture or dislocation. Electronically Signed   By: Morgane  Naveau M.D.   On: 10/26/2023 20:53     Data Reviewed: Relevant notes from primary care and specialist visits, past discharge summaries as available in EHR, including Care Everywhere. Prior diagnostic testing as pertinent to current admission diagnoses Updated medications and problem lists for reconciliation ED course, including vitals, labs, imaging, treatment and response to treatment Triage notes, nursing and pharmacy notes and ED provider's notes Notable results as noted in HPI   Assessment and Plan: * Pneumothorax, left, closed, traumatic, initial encounter Supportive care with oxygen Pain control, guaifenesin as needed Repeat chest x-ray in the a.m.  Fall at home, initial encounter History of prior falls and baseline unsteady gait Abrasion knees and left hand pain related to fall No fractures on imaging Pain control PT eval Fall precautions Wound care  Anemia Hemoglobin 12.9 down from 15.6 about 6 months ago No external bleeding noted Anemia panel Repeat  CBC in a.m. Will hold Eliquis  tonight  PAF (paroxysmal atrial fibrillation) (HCC) Chronic anticoagulation No internal injury suspected except for pneumothorax but will hold Eliquis  tonight in case of occult injury   Essential hypertension Patient currently on HCTZ Might consider different antihypertensive in view of age and history of falls Hydralazine as needed tonight  Type 2 diabetes mellitus (HCC) Sliding scale insulin  coverage  Underweight (BMI < 18.5) Possible protein calorie malnutrition  can consider dietary consult  History of CVA (cerebrovascular accident) Acute stroke not suspected at this time-no focal neurologic deficits Head CT nonacute  H/O malignant neoplasm of prostate No acute issues suspected    DVT prophylaxis: SCD  Consults: none  Advance Care Planning:   Code Status: Prior -  discussed CODE STATUS with granddaughter over the phone  Family Communication: Granddaughter Tyrus Gallus  Disposition Plan: Back to previous home environment  Severity of Illness: The appropriate patient status for this patient is OBSERVATION. Observation status is judged to be reasonable and necessary in order to provide the required intensity of service to ensure the patient's safety. The patient's presenting symptoms, physical exam findings, and initial radiographic and laboratory data in the context of their medical condition is felt to place them at decreased risk for further clinical deterioration. Furthermore, it is anticipated that the patient will be medically stable for discharge from the hospital within 2 midnights of admission.   Author: Lanetta Pion, MD 10/26/2023 11:00 PM  For on call review [email or web address].

## 2023-10-26 NOTE — Assessment & Plan Note (Deleted)
 History of prior falls and baseline unsteady gait Abrasion knees and left hand pain related to fall No fractures on imaging PT and OT evaluations

## 2023-10-26 NOTE — H&P (Incomplete)
 History and Physical    Patient: Jeffrey Rojas RUE:[id number] DOB: 05-09-1935 DOA: 10/26/2023 DOS: the patient was seen and examined on 10/26/2023 PCP: Little Riff, MD  Patient coming from: Home  Chief Complaint:  Chief Complaint  Patient presents with  . Fall on Eliquis     HPI: Jeffrey Rojas is a 89 y.o. male with medical history significant for PAF on Eliquis , history of CVA, T2DM, HTN, dementia, prostate cancer, baseline unsteady gait with prior falls who lives alone and has a daily caregiver for which he was hospitalized in May 2024 with an negative syncope workup, being admitted to observation for 5% apical pneumothorax resulting from an unwitnessed fall.  Patient fell onto his left side sustaining abrasions on bilateral knees and left hand.  He was previously in his usual state of health. ED course and data review: Vitals within normal limits Blood work including troponin BMP, CBC, UA unremarkableExcept for mild anemia of 12.9, down from 15.6 about 6 months prior EKG, personally viewed and interpreted showing NSR at 78 with no concerning ST-T wave changes Trauma imaging included CT head and C-spine and CT chest as well as x-rays of the left hand and left rib series. Notable finding of trauma imaging includes a trace left apical pneumothorax.  Patient was placed on NRB.  Hospitalist consulted for admission     Past Medical History:  Diagnosis Date  . Arthritis   . Cancer (HCC)   . History of prostate cancer   . Hyperlipidemia   . Hypertension   . PAF (paroxysmal atrial fibrillation) (HCC)   . Stroke (HCC) 07/2017  . Type 2 diabetes mellitus (HCC)    Past Surgical History:  Procedure Laterality Date  . ANKLE SURGERY    . CATARACT EXTRACTION W/PHACO Left 05/30/2023   Procedure: CATARACT EXTRACTION PHACO AND INTRAOCULAR LENS PLACEMENT (IOC) LEFT DIABETIC;  Surgeon: Trudi Fus, MD;  Location: [facility];  Service: Ophthalmology;  Laterality: Left;   13.89 1:14.3  . CATARACT EXTRACTION W/PHACO Right 08/30/2023   Procedure: CATARACT EXTRACTION PHACO AND INTRAOCULAR LENS PLACEMENT (IOC) RIGHT DIABETIC;  Surgeon: Trudi Fus, MD;  Location: [facility];  Service: Ophthalmology;  Laterality: Right;  22.18 1:44.9  . COLONOSCOPY WITH ESOPHAGOGASTRODUODENOSCOPY (EGD)  11/24/2010  . LOOP RECORDER INSERTION N/A 07/11/2017   Procedure: LOOP RECORDER INSERTION;  Surgeon: Lei Pump, MD;  Location: [facility];  Service: Cardiovascular;  Laterality: N/A;  . PROSTATECTOMY    . TEE WITHOUT CARDIOVERSION N/A 07/11/2017   Procedure: TRANSESOPHAGEAL ECHOCARDIOGRAM (TEE);  Surgeon: Alroy Aspen Lela Purple, MD;  Location: [facility];  Service: Cardiovascular;  Laterality: N/A;   Social History:  reports that he has never smoked. He has never used smokeless tobacco. He reports that he does not currently use alcohol. He reports that he does not use drugs.  No Known Allergies  Family History  Problem Relation Age of Onset  . Prostate cancer Neg Hx   . Kidney cancer Neg Hx     Prior to Admission medications   Medication Sig Start Date End Date Taking? Authorizing Provider  apixaban  (ELIQUIS ) 2.5 MG TABS tablet Take 1 tablet (2.5 mg total) by mouth 2 (two) times daily. NEEDS CARDIOLOGY VISIT FOR ELIQUIS  REFILLS, CALL OFFICE.  THANK YOU 09/10/23   Verona Goodwill, MD  atorvastatin  (LIPITOR) 20 MG tablet Take 1 tablet (20 mg total) by mouth daily at 6 PM. Patient taking differently: Take 20 mg by mouth daily. 09/12/17   Johny Nap,  NP  busPIRone  (BUSPAR ) 5 MG tablet Take 5 mg by mouth 2 (two) times daily.    [provider]  hydrochlorothiazide (HYDRODIURIL) 25 MG tablet Take 25 mg by mouth daily.    [provider]  metFORMIN (GLUCOPHAGE) 1000 MG tablet Take 1 tablet by mouth 2 (two) times daily with a meal. 10/07/20   [provider]  nitroGLYCERIN  (NITROSTAT ) 0.4 MG SL tablet Place 1 tablet (0.4 mg  total) under the tongue every 5 (five) minutes as needed for chest pain (notify PCP if experiencing chest pain and need to take Nitroglycerin ). 07/11/17   Nichole Barker, NP  venlafaxine  XR (EFFEXOR -XR) 150 MG 24 hr capsule Take 150 mg by mouth daily. 04/20/21   [provider]    Physical Exam: Vitals:   10/26/23 1920 10/26/23 1921 10/26/23 2118  BP: (!) 146/82  (!) 157/83  Pulse: 80  80  Resp: 20  (!) 22  Temp: (!) 97.5 F (36.4 C)    TempSrc: Oral    SpO2: 96%  99%  Weight:  54.9 kg    Physical Exam  Labs on Admission: I have personally reviewed following labs and imaging studies  CBC: Recent Labs  Lab 10/26/23 2014  WBC 9.9  NEUTROABS 7.1  HGB 12.9*  HCT 39.2  MCV 90.5  PLT 242   Basic Metabolic Panel: Recent Labs  Lab 10/26/23 2014  NA 134*  K 4.1  CL 98  CO2 28  GLUCOSE 130*  BUN 23  CREATININE 1.05  CALCIUM  9.1   GFR: Estimated Creatinine Clearance: 39.2 mL/min (by C-G formula based on SCr of 1.05 mg/dL). Liver Function Tests: No results for input(s): "AST", "ALT", "ALKPHOS", "BILITOT", "PROT", "ALBUMIN" in the last 168 hours. No results for input(s): "LIPASE", "AMYLASE" in the last 168 hours. No results for input(s): "AMMONIA" in the last 168 hours. Coagulation Profile: No results for input(s): "INR", "PROTIME" in the last 168 hours. Cardiac Enzymes: No results for input(s): "CKTOTAL", "CKMB", "CKMBINDEX", "TROPONINI" in the last 168 hours. BNP (last 3 results) No results for input(s): "PROBNP" in the last 8760 hours. HbA1C: No results for input(s): "HGBA1C" in the last 72 hours. CBG: No results for input(s): "GLUCAP" in the last 168 hours. Lipid Profile: No results for input(s): "CHOL", "HDL", "LDLCALC", "TRIG", "CHOLHDL", "LDLDIRECT" in the last 72 hours. Thyroid Function Tests: No results for input(s): "TSH", "T4TOTAL", "FREET4", "T3FREE", "THYROIDAB" in the last 72 hours. Anemia Panel: No results for input(s): "VITAMINB12",  "FOLATE", "FERRITIN", "TIBC", "IRON", "RETICCTPCT" in the last 72 hours. Urine analysis:    Component Value Date/Time   COLORURINE STRAW (A) 10/26/2023 2209   APPEARANCEUR CLEAR (A) 10/26/2023 2209   LABSPEC 1.008 10/26/2023 2209   PHURINE 7.0 10/26/2023 2209   GLUCOSEU NEGATIVE 10/26/2023 2209   HGBUR NEGATIVE 10/26/2023 2209   BILIRUBINUR NEGATIVE 10/26/2023 2209   KETONESUR NEGATIVE 10/26/2023 2209   PROTEINUR NEGATIVE 10/26/2023 2209   NITRITE NEGATIVE 10/26/2023 2209   LEUKOCYTESUR NEGATIVE 10/26/2023 2209    Radiological Exams on Admission: DG Ribs Unilateral W/Chest Left Addendum Date: 10/26/2023 ADDENDUM REPORT: 10/26/2023 20:57 ADDENDUM: Trace left apical pneumothorax better evaluated on CT C-spine 10/26/2023. Electronically Signed   By: Morgane  Naveau M.D.   On: 10/26/2023 20:57   Result Date: 10/26/2023 CLINICAL DATA:  fall, pain EXAM: LEFT RIBS AND CHEST - 3+ VIEW COMPARISON:  Chest x-ray 11/09/2022 FINDINGS: Wireless cardiac device overlies left chest. The heart and mediastinal contours are within normal limits. Atherosclerotic plaque. Left base atelectasis.  No focal consolidation. No pulmonary edema. No pleural effusion. No pneumothorax. No acute displaced fracture or other bone lesions are seen involving the left ribs. No acute osseous abnormality. IMPRESSION: 1. No acute displaced left rib fractures. Please note, nondisplaced rib fractures may be occult on radiograph. 2. No acute cardiopulmonary abnormality. 3.  Aortic Atherosclerosis (ICD10-I70.0). Electronically Signed: By: Morgane  Naveau M.D. On: 10/26/2023 20:46   DG Hand Complete Left Result Date: 10/26/2023 CLINICAL DATA:  fall, pain EXAM: LEFT HAND - COMPLETE 3+ VIEW COMPARISON:  None Available. FINDINGS: There is no evidence of fracture or dislocation. There is no evidence of severe arthropathy or other focal bone abnormality. Soft tissues are unremarkable. IMPRESSION: No acute displaced fracture or dislocation.  Electronically Signed   By: Morgane  Naveau M.D.   On: 10/26/2023 20:53     Data Reviewed: Relevant notes from primary care and specialist visits, past discharge summaries as available in EHR, including Care Everywhere. Prior diagnostic testing as pertinent to current admission diagnoses Updated medications and problem lists for reconciliation ED course, including vitals, labs, imaging, treatment and response to treatment Triage notes, nursing and pharmacy notes and ED provider's notes Notable results as noted in HPI   Assessment and Plan: * Pneumothorax, left, closed, traumatic, initial encounter Supportive care with oxygen Pain control, guaifenesin as needed Repeat chest x-ray in the a.m.  Fall at home, initial encounter History of prior falls and baseline unsteady gait Abrasion knees and left hand pain related to fall No fractures on imaging Pain control PT eval Fall precautions Wound care  Anemia Hemoglobin 12.9 down from 15.6 about 6 months ago No external bleeding noted Anemia panel Repeat CBC in a.m. Will hold Eliquis  tonight  PAF (paroxysmal atrial fibrillation) (HCC) Chronic anticoagulation No internal injury suspected except for pneumothorax but will hold Eliquis  tonight in case of occult injury   Essential hypertension Patient currently on HCTZ Might consider different antihypertensive in view of age and history of falls Hydralazine as needed tonight  Type 2 diabetes mellitus (HCC) Sliding scale insulin  coverage  Underweight (BMI < 18.5) Possible protein calorie malnutrition  can consider dietary consult  History of CVA (cerebrovascular accident) Acute stroke not suspected at this time-no focal neurologic deficits Head CT nonacute  H/O malignant neoplasm of prostate No acute issues suspected    DVT prophylaxis: SCD  Consults: none  Advance Care Planning:   Code Status: Prior   Family Communication: none***  Disposition Plan: Back to  previous home environment  Severity of Illness: The appropriate patient status for this patient is OBSERVATION. Observation status is judged to be reasonable and necessary in order to provide the required intensity of service to ensure the patient's safety. The patient's presenting symptoms, physical exam findings, and initial radiographic and laboratory data in the context of their medical condition is felt to place them at decreased risk for further clinical deterioration. Furthermore, it is anticipated that the patient will be medically stable for discharge from the hospital within 2 midnights of admission.   Author: Lanetta Pion, MD 10/26/2023 11:00 PM  For on call review [email or web address].

## 2023-10-26 NOTE — ED Triage Notes (Signed)
 Pt in with caregiver, who states the granddaughter witnessed him falling while walking on pavement this evening. Pt is on Eliquis , denies hitting head, is c/o L hand, L rib cage pain and has abrasions to bilateral knees. No LOC, neck or back pain reported. Is at baseline orientation per caregiver, a&ox3-4

## 2023-10-26 NOTE — ED Notes (Signed)
 Pharmacist Tech at bedside

## 2023-10-26 NOTE — Assessment & Plan Note (Addendum)
 Small left pneumothorax 5%.  Tylenol  as needed for pain.

## 2023-10-26 NOTE — Assessment & Plan Note (Addendum)
 Chronic anticoagulation Patient on low-dose Eliquis 

## 2023-10-26 NOTE — Assessment & Plan Note (Addendum)
 Hemoglobin A1c low at 5.9.  Patient on metformin.

## 2023-10-26 NOTE — Assessment & Plan Note (Addendum)
 Acute stroke not suspected at this time-no focal neurologic deficits Head CT nonacute

## 2023-10-26 NOTE — Assessment & Plan Note (Deleted)
 Hemoglobin 12.9 down from 15.6 about 6 months ago No external bleeding noted Anemia panel Repeat CBC in a.m. Will hold Eliquis  tonight

## 2023-10-26 NOTE — Assessment & Plan Note (Addendum)
 BMI 18.86.

## 2023-10-26 NOTE — Assessment & Plan Note (Signed)
 Delirium precautions

## 2023-10-26 NOTE — Assessment & Plan Note (Addendum)
 Holding hydrochlorothiazide

## 2023-10-26 NOTE — ED Notes (Signed)
 Pt's belonging and cane sent home with care giver Tionnie.

## 2023-10-26 NOTE — ED Provider Notes (Signed)
 [facility] Provider Note    Event Date/Time   First MD Initiated Contact with Patient 10/26/23 1927     (approximate)   History   Fall   HPI  Jeffrey Rojas is a 89 y.o. male   who presents to the emergency department today because of concerns for a fall.  This was apparently not a witnessed fall.  Caregiver thinks that he might of mistaking a picking up pool for his walking stick so when he tried to walk it was too short and he lost his balance.  The patient himself does not remember what happened.  Did not suffer injuries to his left hand and also complaining of some left chest pain.  Caregiver does not report any recent fevers or illness type symptoms.      Physical Exam   Triage Vital Signs: ED Triage Vitals  Encounter Vitals Group     BP 10/26/23 1920 (!) 146/82     Systolic BP Percentile --      Diastolic BP Percentile --      Pulse Rate 10/26/23 1920 80     Resp 10/26/23 1920 20     Temp 10/26/23 1920 (!) 97.5 F (36.4 C)     Temp Source 10/26/23 1920 Oral     SpO2 10/26/23 1920 96 %     Weight 10/26/23 1921 121 lb 0.5 oz (54.9 kg)     Height --      Head Circumference --      Peak Flow --      Pain Score --      Pain Loc --      Pain Education --      Exclude from Growth Chart --     Most recent vital signs: Vitals:   10/26/23 1920  BP: (!) 146/82  Pulse: 80  Resp: 20  Temp: (!) 97.5 F (36.4 C)  SpO2: 96%   General: Awake, alert, not oriented. CV:  Good peripheral perfusion. Regular rate and rhythm. Resp:  Normal effort. Lungs clear. Abd:  No distention.  Other:  Slight tenderness to palpation of left chest. Left hand with bruising and abrasions. Small hematoma to left forehead.    ED Results / Procedures / Treatments   Labs (all labs ordered are listed, but only abnormal results are displayed) Labs Reviewed  CBC WITH DIFFERENTIAL/PLATELET - Abnormal; Notable for the following components:      Result Value    Hemoglobin 12.9 (*)    All other components within normal limits  BASIC METABOLIC PANEL WITH GFR - Abnormal; Notable for the following components:   Sodium 134 (*)    Glucose, Bld 130 (*)    All other components within normal limits  URINALYSIS, ROUTINE W REFLEX MICROSCOPIC - Abnormal; Notable for the following components:   Color, Urine STRAW (*)    APPearance CLEAR (*)    All other components within normal limits  TROPONIN I (HIGH SENSITIVITY)  TROPONIN I (HIGH SENSITIVITY)     EKG  I, Marylynn Soho, attending physician, personally viewed and interpreted this EKG  EKG Time: 2114 Rate: 78 Rhythm: normal sinus rhythm Axis: normal Intervals: qtc 433 QRS: narrow, q waves v1, v2, v3 ST changes: no st elevation Impression: abnormal ekg   RADIOLOGY I independently interpreted and visualized the CT head/cervical spine. My interpretation: No ICH, no fracture Radiology interpretation: IMPRESSION: 1. No acute intracranial abnormality. 2. No acute displaced fracture or traumatic listhesis of the cervical spine. 3.  Trace left apical pneumothorax.  I independently interpreted and visualized the left hand. My interpretation: No acute osseous abnormality Radiology interpretation:  IMPRESSION:  No acute displaced fracture or dislocation.   I independently interpreted and visualized the left rib series. My interpretation: No fracture Radiology interpretation:  IMPRESSION:  1. No acute displaced left rib fractures. Please note, nondisplaced  rib fractures may be occult on radiograph.  2. No acute cardiopulmonary abnormality.  3.  Aortic Atherosclerosis (ICD10-I70.0).       PROCEDURES:  Critical Care performed: Yes  CRITICAL CARE Performed by: Marylynn Soho   Total critical care time: 30 minutes  Critical care time was exclusive of separately billable procedures and treating other patients.  Critical care was necessary to treat or prevent imminent or  life-threatening deterioration.  Critical care was time spent personally by me on the following activities: development of treatment plan with patient and/or surrogate as well as nursing, discussions with consultants, evaluation of patient's response to treatment, examination of patient, obtaining history from patient or surrogate, ordering and performing treatments and interventions, ordering and review of laboratory studies, ordering and review of radiographic studies, pulse oximetry and re-evaluation of patient's condition.   Procedures    MEDICATIONS ORDERED IN ED: Medications - No data to display   IMPRESSION / MDM / ASSESSMENT AND PLAN / ED COURSE  I reviewed the triage vital signs and the nursing notes.                              Differential diagnosis includes, but is not limited to, ICH, fracture, dislocation, contusion  Patient's presentation is most consistent with acute presentation with potential threat to life or bodily function.   The patient is on the cardiac monitor to evaluate for evidence of arrhythmia and/or significant heart rate changes.  Patient presented to the emergency department today after an unwitnessed fall.  Patient complaining of left chest and left hand pain.  Patient also with evidence of head trauma.  CT scan of the head and neck were ordered.  Left rib series was ordered.  CT head did not show any intracranial findings.  No cervical neck fracture however did raise possibility of left-sided pneumothorax.  Rib series did not show any fracture.  CT chest was ordered.  While this also did not show any obvious fracture it did show a small left-sided pneumothorax.  Patient was placed on oxygen.  Discussed with Dr. Vallarie Gauze who will evaluate for admission.      FINAL CLINICAL IMPRESSION(S) / ED DIAGNOSES   Final diagnoses:  Pneumothorax, unspecified type     Note:  This document was prepared using Dragon voice recognition software and may include  unintentional dictation errors.    Marylynn Soho, MD 10/26/23 2252

## 2023-10-26 NOTE — ED Notes (Signed)
 Dr. Para March at bedside

## 2023-10-26 NOTE — Assessment & Plan Note (Signed)
 No acute issues suspected

## 2023-10-26 NOTE — ED Notes (Signed)
 Pt vitals stable. Dr. Hendrick Locke placed pt on nonrebreather due to small pneumothorax to help oxygenate and expand lungs.

## 2023-10-27 ENCOUNTER — Observation Stay

## 2023-10-27 DIAGNOSIS — S270XXA Traumatic pneumothorax, initial encounter: Secondary | ICD-10-CM | POA: Diagnosis present

## 2023-10-27 DIAGNOSIS — F0390 Unspecified dementia without behavioral disturbance: Secondary | ICD-10-CM

## 2023-10-27 DIAGNOSIS — R636 Underweight: Secondary | ICD-10-CM | POA: Diagnosis present

## 2023-10-27 DIAGNOSIS — Z7984 Long term (current) use of oral hypoglycemic drugs: Secondary | ICD-10-CM | POA: Diagnosis not present

## 2023-10-27 DIAGNOSIS — I480 Paroxysmal atrial fibrillation: Secondary | ICD-10-CM | POA: Diagnosis present

## 2023-10-27 DIAGNOSIS — Z602 Problems related to living alone: Secondary | ICD-10-CM | POA: Diagnosis present

## 2023-10-27 DIAGNOSIS — R2681 Unsteadiness on feet: Secondary | ICD-10-CM | POA: Diagnosis present

## 2023-10-27 DIAGNOSIS — D508 Other iron deficiency anemias: Secondary | ICD-10-CM

## 2023-10-27 DIAGNOSIS — J939 Pneumothorax, unspecified: Secondary | ICD-10-CM | POA: Diagnosis not present

## 2023-10-27 DIAGNOSIS — Z681 Body mass index (BMI) 19 or less, adult: Secondary | ICD-10-CM

## 2023-10-27 DIAGNOSIS — T402X5A Adverse effect of other opioids, initial encounter: Secondary | ICD-10-CM | POA: Diagnosis present

## 2023-10-27 DIAGNOSIS — D509 Iron deficiency anemia, unspecified: Secondary | ICD-10-CM | POA: Insufficient documentation

## 2023-10-27 DIAGNOSIS — W1830XA Fall on same level, unspecified, initial encounter: Secondary | ICD-10-CM | POA: Diagnosis present

## 2023-10-27 DIAGNOSIS — S80212A Abrasion, left knee, initial encounter: Secondary | ICD-10-CM | POA: Diagnosis present

## 2023-10-27 DIAGNOSIS — Z7901 Long term (current) use of anticoagulants: Secondary | ICD-10-CM | POA: Diagnosis not present

## 2023-10-27 DIAGNOSIS — R5383 Other fatigue: Secondary | ICD-10-CM | POA: Diagnosis present

## 2023-10-27 DIAGNOSIS — J181 Lobar pneumonia, unspecified organism: Secondary | ICD-10-CM | POA: Diagnosis present

## 2023-10-27 DIAGNOSIS — Z8673 Personal history of transient ischemic attack (TIA), and cerebral infarction without residual deficits: Secondary | ICD-10-CM | POA: Diagnosis not present

## 2023-10-27 DIAGNOSIS — E119 Type 2 diabetes mellitus without complications: Secondary | ICD-10-CM | POA: Diagnosis present

## 2023-10-27 DIAGNOSIS — S80211A Abrasion, right knee, initial encounter: Secondary | ICD-10-CM | POA: Diagnosis present

## 2023-10-27 DIAGNOSIS — Z79899 Other long term (current) drug therapy: Secondary | ICD-10-CM | POA: Diagnosis not present

## 2023-10-27 DIAGNOSIS — Z9181 History of falling: Secondary | ICD-10-CM | POA: Diagnosis not present

## 2023-10-27 DIAGNOSIS — I10 Essential (primary) hypertension: Secondary | ICD-10-CM

## 2023-10-27 DIAGNOSIS — Z8546 Personal history of malignant neoplasm of prostate: Secondary | ICD-10-CM | POA: Diagnosis not present

## 2023-10-27 DIAGNOSIS — E785 Hyperlipidemia, unspecified: Secondary | ICD-10-CM | POA: Diagnosis present

## 2023-10-27 DIAGNOSIS — S60512A Abrasion of left hand, initial encounter: Secondary | ICD-10-CM | POA: Diagnosis present

## 2023-10-27 DIAGNOSIS — W19XXXD Unspecified fall, subsequent encounter: Secondary | ICD-10-CM

## 2023-10-27 LAB — IRON AND TIBC
Iron: 71 ug/dL (ref 45–182)
Saturation Ratios: 23 % (ref 17.9–39.5)
TIBC: 304 ug/dL (ref 250–450)
UIBC: 233 ug/dL

## 2023-10-27 LAB — GLUCOSE, CAPILLARY
Glucose-Capillary: 109 mg/dL — ABNORMAL HIGH (ref 70–99)
Glucose-Capillary: 111 mg/dL — ABNORMAL HIGH (ref 70–99)
Glucose-Capillary: 122 mg/dL — ABNORMAL HIGH (ref 70–99)
Glucose-Capillary: 127 mg/dL — ABNORMAL HIGH (ref 70–99)
Glucose-Capillary: 95 mg/dL (ref 70–99)
Glucose-Capillary: 98 mg/dL (ref 70–99)

## 2023-10-27 LAB — HEMOGLOBIN A1C
Hgb A1c MFr Bld: 5.9 % — ABNORMAL HIGH (ref 4.8–5.6)
Mean Plasma Glucose: 122.63 mg/dL

## 2023-10-27 LAB — CBC
HCT: 38.8 % — ABNORMAL LOW (ref 39.0–52.0)
Hemoglobin: 12.8 g/dL — ABNORMAL LOW (ref 13.0–17.0)
MCH: 29.2 pg (ref 26.0–34.0)
MCHC: 33 g/dL (ref 30.0–36.0)
MCV: 88.4 fL (ref 80.0–100.0)
Platelets: 242 10*3/uL (ref 150–400)
RBC: 4.39 MIL/uL (ref 4.22–5.81)
RDW: 13.4 % (ref 11.5–15.5)
WBC: 8.3 10*3/uL (ref 4.0–10.5)
nRBC: 0 % (ref 0.0–0.2)

## 2023-10-27 LAB — VITAMIN B12: Vitamin B-12: 221 pg/mL (ref 180–914)

## 2023-10-27 LAB — FOLATE: Folate: 18.4 ng/mL (ref >5.9)

## 2023-10-27 LAB — FERRITIN: Ferritin: 51 ng/mL (ref 24–336)

## 2023-10-27 MED ORDER — AMOXICILLIN-POT CLAVULANATE 875-125 MG PO TABS
1.0000 | ORAL_TABLET | Freq: Two times a day (BID) | ORAL | Status: DC
  Administered 2023-10-27: 1 via ORAL
  Filled 2023-10-27: qty 1

## 2023-10-27 MED ORDER — SODIUM CHLORIDE 0.9 % IV SOLN
2.0000 g | INTRAVENOUS | Status: DC
  Administered 2023-10-27 – 2023-10-28 (×2): 2 g via INTRAVENOUS
  Filled 2023-10-27 (×2): qty 20

## 2023-10-27 MED ORDER — MORPHINE SULFATE (PF) 2 MG/ML IV SOLN: 0.5000 mg | INTRAVENOUS | Status: DC | PRN

## 2023-10-27 MED ORDER — SODIUM CHLORIDE 0.9 % IV SOLN
500.0000 mg | INTRAVENOUS | Status: DC
  Administered 2023-10-27: 500 mg via INTRAVENOUS
  Filled 2023-10-27 (×2): qty 5

## 2023-10-27 MED ORDER — OXYCODONE HCL 5 MG PO TABS: 2.5000 mg | ORAL_TABLET | Freq: Three times a day (TID) | ORAL | Status: DC | PRN

## 2023-10-27 MED ORDER — OXYCODONE HCL 5 MG PO TABS: 2.5000 mg | ORAL_TABLET | ORAL | Status: DC | PRN

## 2023-10-27 NOTE — Assessment & Plan Note (Addendum)
 Likely overmedicated with pain medications.  Discontinue morphine.  Decrease dose of oxycodone and spread out frequency.  Try Tylenol  first.  Repeat CT scan of the head does not show any acute stroke or bleed.

## 2023-10-27 NOTE — Plan of Care (Signed)
  Problem: Education: Goal: Ability to describe self-care measures that may prevent or decrease complications (Diabetes Survival Skills Education) will improve Outcome: Progressing Goal: Individualized Educational Video(s) Outcome: Progressing   Problem: Coping: Goal: Ability to adjust to condition or change in health will improve Outcome: Progressing   Problem: Fluid Volume: Goal: Ability to maintain a balanced intake and output will improve Outcome: Progressing   Problem: Health Behavior/Discharge Planning: Goal: Ability to identify and utilize available resources and services will improve Outcome: Progressing Goal: Ability to manage health-related needs will improve Outcome: Progressing   Problem: Metabolic: Goal: Ability to maintain appropriate glucose levels will improve Outcome: Progressing   Problem: Nutritional: Goal: Maintenance of adequate nutrition will improve Outcome: Progressing Goal: Progress toward achieving an optimal weight will improve Outcome: Progressing   Problem: Skin Integrity: Goal: Risk for impaired skin integrity will decrease Outcome: Progressing   Problem: Education: Goal: Knowledge of General Education information will improve Description: Including pain rating scale, medication(s)/side effects and non-pharmacologic comfort measures Outcome: Progressing   Problem: Health Behavior/Discharge Planning: Goal: Ability to manage health-related needs will improve Outcome: Progressing   Problem: Tissue Perfusion: Goal: Adequacy of tissue perfusion will improve Outcome: Progressing   Problem: Clinical Measurements: Goal: Ability to maintain clinical measurements within normal limits will improve Outcome: Progressing Goal: Will remain free from infection Outcome: Progressing Goal: Diagnostic test results will improve Outcome: Progressing Goal: Respiratory complications will improve Outcome: Progressing Goal: Cardiovascular complication will  be avoided Outcome: Progressing   Problem: Activity: Goal: Risk for activity intolerance will decrease Outcome: Progressing   Problem: Nutrition: Goal: Adequate nutrition will be maintained Outcome: Progressing   Problem: Coping: Goal: Level of anxiety will decrease Outcome: Progressing   Problem: Pain Managment: Goal: General experience of comfort will improve and/or be controlled Outcome: Progressing   Problem: Elimination: Goal: Will not experience complications related to bowel motility Outcome: Progressing Goal: Will not experience complications related to urinary retention Outcome: Progressing   Problem: Safety: Goal: Ability to remain free from injury will improve Outcome: Progressing   Problem: Skin Integrity: Goal: Risk for impaired skin integrity will decrease Outcome: Progressing

## 2023-10-27 NOTE — Assessment & Plan Note (Signed)
Hemoglobin = 12.8

## 2023-10-27 NOTE — Progress Notes (Signed)
 Progress Note   Patient: Jeffrey Rojas [id number] DOB: 1935-03-02 DOA: 10/26/2023     0 DOS: the patient was seen and examined on 10/27/2023   Brief hospital course: 89 y.o. male with medical history significant for PAF on Eliquis , history of CVA, T2DM, HTN, dementia, prostate cancer, baseline unsteady gait with prior falls who lives alone and has a daily caregiver for which he was hospitalized in May 2024 with an negative syncope workup, being admitted to observation for 5% apical pneumothorax resulting from an unwitnessed fall.  Patient fell onto his left side sustaining abrasions on bilateral knees and left hand.  He was previously in his usual state of health. History as given by daytime caregiver at bedside who says patient lives alone says she had just left the home when she was called to go back as patient had fallen outside. She said he told her he was going to pay bills. She is uncertain of the circumstances but said she does not think he was able to get up on his own. ED course and data review: Vitals within normal limits Blood work including troponin BMP, CBC, UA unremarkableExcept for mild anemia of 12.9, down from 15.6 about 6 months prior EKG, personally viewed and interpreted showing NSR at 78 with no concerning ST-T wave changes Trauma imaging included CT head and C-spine and CT chest as well as x-rays of the left hand and left rib series. Notable finding of trauma imaging includes a trace left apical pneumothorax.   Patient was placed on NRB.  Hospitalist consulted for admission   10/27/23.  Patient lethargic.  Repeat x-ray shows possible pneumonia.  Initially started Augmentin but will switch over to Rocephin and Zithromax.  Discontinue morphine.  Try Decrease dose of oxycodone.  Asked nursing staff to try Tylenol  first.  Assessment and Plan: * Lethargy Likely overmedicated with pain medications.  Discontinue morphine.  Decrease dose of oxycodone and spread out frequency.  Try  Tylenol  first.  Repeat CT scan of the head does not show any acute stroke or bleed.  Pneumothorax Small left pneumothorax 5%.  Supportive care with oxygen Try Tylenol  first for pain  PAF (paroxysmal atrial fibrillation) (HCC) Chronic anticoagulation Patient on low-dose Eliquis    Essential hypertension Holding hydrochlorothiazide  Type 2 diabetes mellitus (HCC) Hemoglobin A1c low at 5.9  Falls, subsequent encounter PT and OT evaluations  Iron deficiency anemia Hemoglobin 12.8  Dementia without behavioral disturbance (HCC) Delirium precautions  Underweight (BMI < 18.5) Possible protein calorie malnutrition  can consider dietary consult  History of CVA (cerebrovascular accident) Acute stroke not suspected at this time-no focal neurologic deficits Head CT nonacute  H/O malignant neoplasm of prostate No acute issues suspected        Subjective: Patient lethargic this morning.  Looks like he is overmedicated.  Repeat CT scan of the head was negative for bleed or stroke.  Physical Exam: Vitals:   10/27/23 0031 10/27/23 0348 10/27/23 0353 10/27/23 0951  BP:  (!) 151/77 101/60 (!) 116/54  Pulse:  68 86 65  Resp:  20 20 15   Temp:  (!) 97.3 F (36.3 C) 98.8 F (37.1 C) 97.7 F (36.5 C)  TempSrc:      SpO2:  100% 93% 100%  Weight: 57 kg     Height: 5\' 9"  (1.753 m)      Physical Exam HENT:     Head: Normocephalic.  Eyes:     General: Lids are normal.     Conjunctiva/sclera: Conjunctivae normal.  Cardiovascular:     Rate and Rhythm: Normal rate and regular rhythm.     Heart sounds: Normal heart sounds, S1 normal and S2 normal.  Pulmonary:     Breath sounds: No decreased breath sounds, wheezing, rhonchi or rales.  Abdominal:     Palpations: Abdomen is soft.     Tenderness: There is no abdominal tenderness.  Musculoskeletal:     Right lower leg: No swelling.     Left lower leg: No swelling.  Skin:    General: Skin is warm.     Findings: No rash.   Neurological:     Mental Status: He is lethargic.     Comments: Answered a few questions but went back to sleep.     Data Reviewed: White blood count 8.3, hemoglobin 12.8, platelet count 242 Chest x-ray showing stable small left pneumothorax, new airspace opacity within the periphery of the left midlung  Family Communication: Family outside the room  Disposition: Status is: Observation Patient lethargic today likely with pain medications.  Continue to monitor.  Not discharging today.  Planned Discharge Destination: To be determined    Time spent: 28 minutes  Author: Verla Glaze, MD 10/27/2023 2:03 PM  For on call review [email or web address].

## 2023-10-27 NOTE — Plan of Care (Signed)

## 2023-10-27 NOTE — Hospital Course (Signed)
 89 y.o. male with medical history significant for PAF on Eliquis , history of CVA, T2DM, HTN, dementia, prostate cancer, baseline unsteady gait with prior falls who lives alone and has a daily caregiver for which he was hospitalized in May 2024 with an negative syncope workup, being admitted to observation for 5% apical pneumothorax resulting from an unwitnessed fall.  Patient fell onto his left side sustaining abrasions on bilateral knees and left hand.  He was previously in his usual state of health. History as given by daytime caregiver at bedside who says patient lives alone says she had just left the home when she was called to go back as patient had fallen outside. She said he told her he was going to pay bills. She is uncertain of the circumstances but said she does not think he was able to get up on his own. ED course and data review: Vitals within normal limits Blood work including troponin BMP, CBC, UA unremarkableExcept for mild anemia of 12.9, down from 15.6 about 6 months prior EKG, personally viewed and interpreted showing NSR at 78 with no concerning ST-T wave changes Trauma imaging included CT head and C-spine and CT chest as well as x-rays of the left hand and left rib series. Notable finding of trauma imaging includes a trace left apical pneumothorax.   Patient was placed on NRB.  Hospitalist consulted for admission   10/27/23.  Patient lethargic.  Repeat x-ray shows possible pneumonia.  Initially started Augmentin but will switch over to Rocephin and Zithromax.  Discontinue morphine.  Try Decrease dose of oxycodone.  Asked nursing staff to try Tylenol  first. 10/28/23.  Family interested in taking patient home.  This morning he was tired and slow to get up.  This afternoon more alert and wanting to go home.  No complaints of pain or shortness of breath.  Able to come off oxygen.

## 2023-10-27 NOTE — Progress Notes (Signed)
 OT Cancellation Note  Patient Details Name: Jeffrey Rojas MRN: [id number] DOB: Jan 13, 1935   Cancelled Treatment:    Reason Eval/Treat Not Completed: Fatigue/lethargy limiting ability to participate. Orders received, chart reviewed. Pt recently back from CT, drowsy and unable to awaken long enough for OT eval to be performed. OT will re-attempt and check back as able.   Ciria Bernardini L. Reigan Tolliver, OTR/L  10/27/23, 11:49 AM

## 2023-10-27 NOTE — Assessment & Plan Note (Signed)
PT and OT evaluations. ?

## 2023-10-28 DIAGNOSIS — I480 Paroxysmal atrial fibrillation: Secondary | ICD-10-CM | POA: Diagnosis not present

## 2023-10-28 DIAGNOSIS — J939 Pneumothorax, unspecified: Secondary | ICD-10-CM | POA: Diagnosis not present

## 2023-10-28 DIAGNOSIS — R5383 Other fatigue: Secondary | ICD-10-CM | POA: Diagnosis not present

## 2023-10-28 DIAGNOSIS — J181 Lobar pneumonia, unspecified organism: Secondary | ICD-10-CM | POA: Diagnosis not present

## 2023-10-28 DIAGNOSIS — Z8546 Personal history of malignant neoplasm of prostate: Secondary | ICD-10-CM

## 2023-10-28 LAB — GLUCOSE, CAPILLARY: Glucose-Capillary: 185 mg/dL — ABNORMAL HIGH (ref 70–99)

## 2023-10-28 MED ORDER — ACETAMINOPHEN 325 MG PO TABS: 650.0000 mg | ORAL_TABLET | Freq: Four times a day (QID) | ORAL | Status: AC | PRN

## 2023-10-28 MED ORDER — ENSURE ENLIVE PO LIQD
237.0000 mL | Freq: Two times a day (BID) | ORAL | Status: DC
  Administered 2023-10-28: 237 mL via ORAL

## 2023-10-28 MED ORDER — AMLODIPINE BESYLATE 5 MG PO TABS
5.0000 mg | ORAL_TABLET | Freq: Every day | ORAL | Status: DC
  Administered 2023-10-28: 5 mg via ORAL
  Filled 2023-10-28: qty 1

## 2023-10-28 MED ORDER — CEFDINIR 300 MG PO CAPS: 300.0000 mg | ORAL_CAPSULE | Freq: Two times a day (BID) | ORAL | 0 refills | Status: AC

## 2023-10-28 MED ORDER — ENSURE ENLIVE PO LIQD: 237.0000 mL | Freq: Two times a day (BID) | ORAL | 0 refills | Status: AC

## 2023-10-28 MED ORDER — AZITHROMYCIN 250 MG PO TABS: ORAL_TABLET | ORAL | 0 refills | Status: AC

## 2023-10-28 MED ORDER — AZITHROMYCIN 250 MG PO TABS
500.0000 mg | ORAL_TABLET | Freq: Every day | ORAL | Status: DC
  Administered 2023-10-28: 500 mg via ORAL
  Filled 2023-10-28: qty 2

## 2023-10-28 MED ORDER — ADULT MULTIVITAMIN W/MINERALS CH
1.0000 | ORAL_TABLET | Freq: Every day | ORAL | Status: DC
  Administered 2023-10-28: 1 via ORAL
  Filled 2023-10-28: qty 1

## 2023-10-28 NOTE — Plan of Care (Signed)
  Problem: Skin Integrity: Goal: Risk for impaired skin integrity will decrease Outcome: Progressing   Problem: Pain Managment: Goal: General experience of comfort will improve and/or be controlled Outcome: Progressing

## 2023-10-28 NOTE — Assessment & Plan Note (Signed)
 Repeat chest x-ray showed possible pneumonia.  Started on Rocephin and Zithromax yesterday will give Omnicef and Zithromax upon discharge.

## 2023-10-28 NOTE — Progress Notes (Signed)
 Occupational Therapy Evaluation Patient Details Name: Jeffrey Rojas MRN: [id number] DOB: 1935-01-19 Today's Date: 10/28/2023   History of Present Illness   89 y.o. male with medical history significant for PAF on Eliquis , history of CVA, T2DM, HTN, dementia, prostate cancer, baseline unsteady gait with prior falls who lives alone and has a daily caregiver for which he was hospitalized in May 2024 with an negative syncope workup, being admitted to observation for 5% apical pneumothorax resulting from an unwitnessed fall.  Patient fell onto his left side sustaining abrasions on bilateral knees and left hand.     Clinical Impressions Pt was seen for OT evaluation this date. Pt is a poor historian, home setup/PLOF will need to be confirmed with family/caregiver. Pt reports PTA, he was amb with a cane, able to dress and bathe himself. Pt endorses his home health aid assist him with food/feeding, medication management and all other IADLs. Pt lives alone, he stated his aid comes to his home daily for 3 hours. Pt presents to acute OT demonstrating impaired ADL performance and functional mobility 2/2 (See OT problem list for additional functional deficits). Pt completed STS with CGA from recliner with RW. Pt currently requires CGA + RW during amb within pt room, total ~22ft on RA, no LOB noted. Pt was eager to amb during session, demonstrated inconsistently with following multi step directions. Pt declined toilet and LB dressing tasks. Pt on Spo2 100% 2L oxygen via [place] on arrival to room, post amb pt on RA Spo2 93% with no noted SOB. Pt returned to recliner with all needs in reach, RN notified of oxygen levels, agreed to place pt on 1L via [place] at the end of session. Pt would benefit from skilled OT services to address noted impairments and functional limitations (see below for any additional details) in order to maximize safety and independence while minimizing falls risk and caregiver burden. OT will follow  acutely.     If plan is discharge home, recommend the following:   A little help with walking and/or transfers;A little help with bathing/dressing/bathroom;Direct supervision/assist for medications management;Direct supervision/assist for financial management;Assist for transportation;Help with stairs or ramp for entrance;Supervision due to cognitive status;Assistance with cooking/housework;Assistance with feeding     Functional Status Assessment   Patient has had a recent decline in their functional status and demonstrates the ability to make significant improvements in function in a reasonable and predictable amount of time.     Equipment Recommendations   Other (comment) (Rolling walker)     Recommendations for Other Services         Precautions/Restrictions   Precautions Precautions: Fall Recall of Precautions/Restrictions: Impaired Restrictions Weight Bearing Restrictions Per Provider Order: No     Mobility Bed Mobility               General bed mobility comments: NT in recliner pre/post session    Transfers Overall transfer level: Needs assistance Equipment used: Rolling walker (2 wheels) Transfers: Sit to/from Stand, Bed to chair/wheelchair/BSC Sit to Stand: Contact guard assist           General transfer comment: STS with CGA + RW, amb within room multiple laps ~59ft with no LOB, slow yet steady gait      Balance Overall balance assessment: Needs assistance Sitting-balance support: Feet supported, No upper extremity supported Sitting balance-Leahy Scale: Good     Standing balance support: Reliant on assistive device for balance, Bilateral upper extremity supported Standing balance-Leahy Scale: Fair Standing balance comment: Good prolong standing  tolerance                           ADL either performed or assessed with clinical judgement   ADL Overall ADL's : Needs assistance/impaired Eating/Feeding:  (Requires feeder per  RN)                   Lower Body Dressing:  (States he doesnt wear socks)   Toilet Transfer: Contact guard assist;Ambulation;Rolling walker (2 wheels)           Functional mobility during ADLs: Contact guard assist;Rolling walker (2 wheels) General ADL Comments: Denied LB dressing tasks (reports he doesn't wear socks), simulated toilet t/f with CGA + RW      Pertinent Vitals/Pain Pain Assessment Pain Assessment: No/denies pain Breathing: normal Negative Vocalization: none Facial Expression: smiling or inexpressive Body Language: relaxed Consolability: no need to console PAINAD Score: 0     Extremity/Trunk Assessment Upper Extremity Assessment Upper Extremity Assessment: Generalized weakness;Overall [facility] for tasks assessed   Lower Extremity Assessment Lower Extremity Assessment: Generalized weakness;Defer to PT evaluation;Overall [facility] for tasks assessed   Cervical / Trunk Assessment Cervical / Trunk Assessment: Normal   Communication Communication Communication: Impaired Factors Affecting Communication: Hearing impaired   Cognition Arousal: Alert Behavior During Therapy: WFL for tasks assessed/performed Cognition: No family/caregiver present to determine baseline, History of cognitive impairments             OT - Cognition Comments: Alert and oriented to self and ?location (able to name type of location with cues)                 Following commands: Impaired Following commands impaired: Follows multi-step commands inconsistently     Cueing  General Comments   Cueing Techniques: Verbal cues;Gestural cues  Hand dressings from fall are intact pre/post session   Exercises Exercises: Other exercises Other Exercises Other Exercises: Edu: Role of OT, safe ADL completion, fall prevention   Shoulder Instructions      Home Living Family/patient expects to be discharged to:: Private residence Living Arrangements: Alone Available Help at  Discharge: Personal care attendant;Family Type of Home: House Home Access: Level entry     Home Layout: One level     Bathroom Shower/Tub: Tub/shower unit         Home Equipment: Agricultural consultant (2 wheels);Cane - single point   Additional Comments: Pt is a poor historian, will need to confirm home set up      Prior Functioning/Environment Prior Level of Function : Patient poor historian/Family not available;Needs assist             Mobility Comments: Cane at baseline ADLs Comments: Aid assists pt with ADLs and IADLs    OT Problem List: Decreased strength;Decreased activity tolerance;Impaired balance (sitting and/or standing);Decreased cognition;Decreased coordination;Decreased safety awareness;Decreased knowledge of use of DME or AE;Decreased knowledge of precautions   OT Treatment/Interventions: Self-care/ADL training;Therapeutic exercise;Energy conservation;DME and/or AE instruction;Therapeutic activities;Cognitive remediation/compensation;Patient/family education;Canalith reposition      OT Goals(Current goals can be found in the care plan section)   Acute Rehab OT Goals OT Goal Formulation: Patient unable to participate in goal setting Time For Goal Achievement: 11/11/23 Potential to Achieve Goals: Good ADL Goals Pt Will Perform Grooming: with supervision;standing Pt Will Perform Lower Body Dressing: sit to/from stand;with min assist Pt Will Transfer to Toilet: with supervision;ambulating;regular height toilet Pt Will Perform Toileting - Clothing Manipulation and hygiene: with mod assist   OT Frequency:  Min 2X/week    Co-evaluation              AM-PAC OT "6 Clicks" Daily Activity     Outcome Measure Help from another person eating meals?: A Lot Help from another person taking care of personal grooming?: A Lot Help from another person toileting, which includes using toliet, bedpan, or urinal?: A Little Help from another person bathing (including  washing, rinsing, drying)?: A Lot Help from another person to put on and taking off regular upper body clothing?: A Little Help from another person to put on and taking off regular lower body clothing?: A Lot 6 Click Score: 14   End of Session Equipment Utilized During Treatment: Rolling walker (2 wheels);Gait belt;Oxygen Nurse Communication: Mobility status;Other (comment) (oxygen levels)  Activity Tolerance: Patient tolerated treatment well Patient left: in chair;with chair alarm set;with call bell/phone within reach;with nursing/sitter in room  OT Visit Diagnosis: Unsteadiness on feet (R26.81);Repeated falls (R29.6);Muscle weakness (generalized) (M62.81)                Time: 2130-8657 OT Time Calculation (min): 18 min Charges:  OT General Charges $OT Visit: 1 Visit OT Evaluation $OT Eval Moderate Complexity: 1 Mod OT Treatments $Self Care/Home Management : 8-22 mins  Rosaria Common M.S. OTR/L  10/28/23, 12:09 PM

## 2023-10-28 NOTE — Discharge Summary (Signed)
 Physician Discharge Summary   Patient: Jeffrey Rojas MRN: [id number] DOB: 01/20/1935  Admit date:     10/26/2023  Discharge date: 10/28/23  Discharge Physician: Verla Glaze   PCP: Little Riff, MD   Recommendations at discharge:   Follow-up PCP 5 days   Discharge Diagnoses: Principal Problem:   Lethargy Active Problems:   Lobar pneumonia (HCC)   Pneumothorax   PAF (paroxysmal atrial fibrillation) (HCC)   Chronic anticoagulation   Essential hypertension   Type 2 diabetes mellitus (HCC)   H/O malignant neoplasm of prostate   History of CVA (cerebrovascular accident)   Underweight (BMI < 18.5)   Dementia without behavioral disturbance (HCC)   Iron deficiency anemia   Falls, subsequent encounter    Hospital Course: 89 y.o. male with medical history significant for PAF on Eliquis , history of CVA, T2DM, HTN, dementia, prostate cancer, baseline unsteady gait with prior falls who lives alone and has a daily caregiver for which he was hospitalized in May 2024 with an negative syncope workup, being admitted to observation for 5% apical pneumothorax resulting from an unwitnessed fall.  Patient fell onto his left side sustaining abrasions on bilateral knees and left hand.  He was previously in his usual state of health. History as given by daytime caregiver at bedside who says patient lives alone says she had just left the home when she was called to go back as patient had fallen outside. She said he told her he was going to pay bills. She is uncertain of the circumstances but said she does not think he was able to get up on his own. ED course and data review: Vitals within normal limits Blood work including troponin BMP, CBC, UA unremarkableExcept for mild anemia of 12.9, down from 15.6 about 6 months prior EKG, personally viewed and interpreted showing NSR at 78 with no concerning ST-T wave changes Trauma imaging included CT head and C-spine and CT chest as well as x-rays of the  left hand and left rib series. Notable finding of trauma imaging includes a trace left apical pneumothorax.   Patient was placed on NRB.  Hospitalist consulted for admission   10/27/23.  Patient lethargic.  Repeat x-ray shows possible pneumonia.  Initially started Augmentin but will switch over to Rocephin and Zithromax.  Discontinue morphine.  Try Decrease dose of oxycodone.  Asked nursing staff to try Tylenol  first. 10/28/23.  Family interested in taking patient home.  This morning he was tired and slow to get up.  This afternoon more alert and wanting to go home.  No complaints of pain or shortness of breath.  Able to come off oxygen.  Assessment and Plan: * Lethargy Likely overmedicated with pain medications on admission.  Discontinued all pain medication.  Tylenol  for pain.  Mental status improved prior to discharge.  Lobar pneumonia (HCC) Repeat chest x-ray showed possible pneumonia.  Started on Rocephin and Zithromax yesterday will give Omnicef and Zithromax upon discharge.  Pneumothorax Small left pneumothorax 5%.  Tylenol  as needed for pain.  PAF (paroxysmal atrial fibrillation) (HCC) Chronic anticoagulation Patient on low-dose Eliquis    Essential hypertension Holding hydrochlorothiazide.  Can go back on Lotrel.  Type 2 diabetes mellitus (HCC) Hemoglobin A1c low at 5.9.  Patient on metformin.  Falls, subsequent encounter PT and OT evaluations  Iron deficiency anemia Hemoglobin 12.8  Dementia without behavioral disturbance (HCC) Delirium precautions  Underweight (BMI < 18.5) BMI 18.55  History of CVA (cerebrovascular accident) Acute stroke not suspected at this time-no focal  neurologic deficits Head CT nonacute  H/O malignant neoplasm of prostate No acute issues suspected         Consultants: None Procedures performed: None Disposition: Home health Diet recommendation:  Cardiac and Carb modified diet DISCHARGE MEDICATION: Allergies as of 10/28/2023    No Known Allergies      Medication List     STOP taking these medications    hydrochlorothiazide 25 MG tablet Commonly known as: HYDRODIURIL       TAKE these medications    acetaminophen  325 MG tablet Commonly known as: TYLENOL  Take 2 tablets (650 mg total) by mouth every 6 (six) hours as needed for mild pain (pain score 1-3) (or Fever >/= 101).   amLODipine -benazepril  10-20 MG capsule Commonly known as: LOTREL Take 1 capsule by mouth daily.   apixaban  2.5 MG Tabs tablet Commonly known as: Eliquis  Take 1 tablet (2.5 mg total) by mouth 2 (two) times daily. NEEDS CARDIOLOGY VISIT FOR ELIQUIS  REFILLS, CALL OFFICE.  THANK YOU   atorvastatin  20 MG tablet Commonly known as: LIPITOR Take 1 tablet (20 mg total) by mouth daily at 6 PM. What changed: when to take this   azithromycin 250 MG tablet Commonly known as: ZITHROMAX One tab po daily for three days Start taking on: October 29, 2023   busPIRone  5 MG tablet Commonly known as: BUSPAR  Take 5 mg by mouth 2 (two) times daily.   cefdinir 300 MG capsule Commonly known as: OMNICEF Take 1 capsule (300 mg total) by mouth 2 (two) times daily for 3 days. Start taking on: October 29, 2023   feeding supplement Liqd Take 237 mLs by mouth 2 (two) times daily between meals.   metFORMIN 1000 MG tablet Commonly known as: GLUCOPHAGE Take 1 tablet by mouth 2 (two) times daily with a meal.   nitroGLYCERIN  0.4 MG SL tablet Commonly known as: NITROSTAT  Place 1 tablet (0.4 mg total) under the tongue every 5 (five) minutes as needed for chest pain (notify PCP if experiencing chest pain and need to take Nitroglycerin ).   venlafaxine  XR 150 MG 24 hr capsule Commonly known as: EFFEXOR -XR Take 150 mg by mouth daily.        Follow-up Information     Little Riff, MD Follow up in 5 day(s).   Specialty: Internal Medicine Contact information: [address] [facility] [place] [address] [phone]                 Discharge Exam: Filed Weights   10/26/23 1921 10/27/23 0031  Weight: 54.9 kg 57 kg   Physical Exam HENT:     Head: Normocephalic.  Eyes:     General: Lids are normal.     Conjunctiva/sclera: Conjunctivae normal.  Cardiovascular:     Rate and Rhythm: Normal rate and regular rhythm.     Heart sounds: Normal heart sounds, S1 normal and S2 normal.  Pulmonary:     Breath sounds: No decreased breath sounds, wheezing, rhonchi or rales.  Abdominal:     Palpations: Abdomen is soft.     Tenderness: There is no abdominal tenderness.  Musculoskeletal:     Right lower leg: No swelling.     Left lower leg: No swelling.  Skin:    General: Skin is warm.     Findings: No rash.  Neurological:     Mental Status: He is alert.     Comments: This afternoon answering questions a lot better.  Interested in going home.  Condition at discharge: fair  The results of significant diagnostics from this hospitalization (including imaging, microbiology, ancillary and laboratory) are listed below for reference.   Imaging Studies: CT HEAD WO CONTRAST ( ) Result Date: 10/27/2023 CLINICAL DATA:  89 year old male with altered mental status. Recent fall. EXAM: CT HEAD WITHOUT CONTRAST TECHNIQUE: Contiguous axial images were obtained from the base of the skull through the vertex without intravenous contrast. RADIATION DOSE REDUCTION: This exam was performed according to the departmental dose-optimization program which includes automated exposure control, adjustment of the mA and/or kV according to patient size and/or use of iterative reconstruction technique. COMPARISON:  Brain MRI 07/10/2017.  Head CT yesterday. FINDINGS: Brain: No midline shift, ventriculomegaly, mass effect, evidence of mass lesion, intracranial hemorrhage or evidence of cortically based acute infarction. Chronic left PCA territory infarct and encephalomalacia. Stable gray-white matter differentiation throughout the brain. Faint  basal ganglia vascular calcifications. Vascular: Calcified atherosclerosis at the skull base. No suspicious intracranial vascular hyperdensity. Skull: Stable, intact.  No acute osseous abnormality identified. Sinuses/Orbits: Visualized paranasal sinuses and mastoids are stable and well aerated. Other: Stable orbit and scalp soft tissues. IMPRESSION: 1. No acute intracranial abnormality. 2. Chronic Left PCA territory infarct. Electronically Signed   By: Marlise Simpers M.D.   On: 10/27/2023 11:35   DG Chest Port 1 View Result Date: 10/27/2023 CLINICAL DATA:  Follow-up pneumothorax. EXAM: PORTABLE CHEST 1 VIEW COMPARISON:  CT 10/26/2023 FINDINGS: Loop recorder noted in the projection of the left side of heart. Heart size and mediastinal contours are normal. Aortic atherosclerotic calcifications. Small left pneumothorax is unchanged from previous exam. This measures 8 mm over the apex. Left lower lobe chronic bronchiectasis and scarring is again noted. New airspace opacity noted within the periphery of the left midlung. Right lung appears clear. IMPRESSION: 1. Stable small left pneumothorax. 2. New airspace opacity within the periphery of the left midlung. 3. Chronic left lower lobe bronchiectasis and scarring. Electronically Signed   By: Kimberley Penman M.D.   On: 10/27/2023 06:19   CT CHEST WO CONTRAST Result Date: 10/26/2023 CLINICAL DATA:  Witnessed fall, left rib pain EXAM: CT CHEST WITHOUT CONTRAST TECHNIQUE: Multidetector CT imaging of the chest was performed following the standard protocol without IV contrast. RADIATION DOSE REDUCTION: This exam was performed according to the departmental dose-optimization program which includes automated exposure control, adjustment of the mA and/or kV according to patient size and/or use of iterative reconstruction technique. COMPARISON:  10/26/2023 FINDINGS: Cardiovascular: Unenhanced imaging of the heart is unremarkable without pericardial effusion. 4 cm ascending thoracic  aortic aneurysm. Atherosclerosis of the aorta and coronary vasculature. Assessment of the vascular lumen cannot be performed without IV contrast. Mediastinum/Nodes: No enlarged mediastinal or axillary lymph nodes. Thyroid gland, trachea, and esophagus demonstrate no significant findings. Lungs/Pleura: There is a small left pneumothorax volume estimated less than 5%. Scattered left basilar atelectasis. There is bilateral bronchiectasis with bibasilar subpleural scarring. No evidence of pleural effusion. Upper Abdomen: No acute abnormality. Musculoskeletal: There are no acute displaced fractures. Reconstructed images demonstrate no additional findings. IMPRESSION: 1. Small left pneumothorax volume estimated less than 5%. No tension effect or midline shift. 2. Mild left basilar atelectasis. 3. Bilateral bronchiectasis with bibasilar scarring. 4. 4 cm ascending thoracic aortic aneurysm. Recommend annual imaging followup by CTA or MRA. This recommendation follows 2010 ACCF/AHA/AATS/ACR/ASA/SCA/SCAI/SIR/STS/SVM Guidelines for the Diagnosis and Management of Patients with Thoracic Aortic Disease. Circulation. 2010; 121: W098-J191. Aortic aneurysm NOS (ICD10-I71.9) 5. Aortic Atherosclerosis (ICD10-I70.0). Coronary artery atherosclerosis. Critical Value/emergent results were called  by telephone at the time of interpretation on 10/26/2023 at 9:47 pm to provider GRAYDON GOODMAN , who verbally acknowledged these results. Electronically Signed   By: Bobbye Burrow M.D.   On: 10/26/2023 21:52   CT Head Wo Contrast Result Date: 10/26/2023 CLINICAL DATA:  Fall EXAM: CT HEAD WITHOUT CONTRAST CT CERVICAL SPINE WITHOUT CONTRAST TECHNIQUE: Multidetector CT imaging of the head and cervical spine was performed following the standard protocol without intravenous contrast. Multiplanar CT image reconstructions of the cervical spine were also generated. RADIATION DOSE REDUCTION: This exam was performed according to the departmental  dose-optimization program which includes automated exposure control, adjustment of the mA and/or kV according to patient size and/or use of iterative reconstruction technique. COMPARISON:  CT head and C-spine 11/09/2022 FINDINGS: CT HEAD FINDINGS Brain: Patchy and confluent areas of decreased attenuation are noted throughout the deep and periventricular white matter of the cerebral hemispheres bilaterally, compatible with chronic microvascular ischemic disease. Left occipital lobe encephalomalacia. No evidence of large-territorial acute infarction. No parenchymal hemorrhage. No mass lesion. No extra-axial collection. No mass effect or midline shift. No hydrocephalus. Basilar cisterns are patent. Vascular: No hyperdense vessel. Atherosclerotic calcifications are present within the cavernous internal carotid arteries. Skull: No acute fracture or focal lesion. Sinuses/Orbits: Paranasal sinuses and mastoid air cells are clear. Bilateral lens replacement. Otherwise the orbits are unremarkable. Other: None. CT CERVICAL SPINE FINDINGS Alignment: Normal. Skull base and vertebrae: Diffusely decreased bone density. No acute fracture. No aggressive appearing focal osseous lesion or focal pathologic process. Soft tissues and spinal canal: No prevertebral fluid or swelling. No visible canal hematoma. Upper chest: Trace left apical pneumothorax. Other: None. IMPRESSION: 1. No acute intracranial abnormality. 2. No acute displaced fracture or traumatic listhesis of the cervical spine. 3. Trace left apical pneumothorax. These results were called by telephone at the time of interpretation on 10/26/2023 at 8:59 pm to provider GRAYDON GOODMAN , who verbally acknowledged these results. Electronically Signed   By: Morgane  Naveau M.D.   On: 10/26/2023 21:01   CT Cervical Spine Wo Contrast Result Date: 10/26/2023 CLINICAL DATA:  Fall EXAM: CT HEAD WITHOUT CONTRAST CT CERVICAL SPINE WITHOUT CONTRAST TECHNIQUE: Multidetector CT imaging of  the head and cervical spine was performed following the standard protocol without intravenous contrast. Multiplanar CT image reconstructions of the cervical spine were also generated. RADIATION DOSE REDUCTION: This exam was performed according to the departmental dose-optimization program which includes automated exposure control, adjustment of the mA and/or kV according to patient size and/or use of iterative reconstruction technique. COMPARISON:  CT head and C-spine 11/09/2022 FINDINGS: CT HEAD FINDINGS Brain: Patchy and confluent areas of decreased attenuation are noted throughout the deep and periventricular white matter of the cerebral hemispheres bilaterally, compatible with chronic microvascular ischemic disease. Left occipital lobe encephalomalacia. No evidence of large-territorial acute infarction. No parenchymal hemorrhage. No mass lesion. No extra-axial collection. No mass effect or midline shift. No hydrocephalus. Basilar cisterns are patent. Vascular: No hyperdense vessel. Atherosclerotic calcifications are present within the cavernous internal carotid arteries. Skull: No acute fracture or focal lesion. Sinuses/Orbits: Paranasal sinuses and mastoid air cells are clear. Bilateral lens replacement. Otherwise the orbits are unremarkable. Other: None. CT CERVICAL SPINE FINDINGS Alignment: Normal. Skull base and vertebrae: Diffusely decreased bone density. No acute fracture. No aggressive appearing focal osseous lesion or focal pathologic process. Soft tissues and spinal canal: No prevertebral fluid or swelling. No visible canal hematoma. Upper chest: Trace left apical pneumothorax. Other: None. IMPRESSION: 1. No acute intracranial abnormality.  2. No acute displaced fracture or traumatic listhesis of the cervical spine. 3. Trace left apical pneumothorax. These results were called by telephone at the time of interpretation on 10/26/2023 at 8:59 pm to provider GRAYDON GOODMAN , who verbally acknowledged these  results. Electronically Signed   By: Morgane  Naveau M.D.   On: 10/26/2023 21:01   DG Ribs Unilateral W/Chest Left Addendum Date: 10/26/2023 ADDENDUM REPORT: 10/26/2023 20:57 ADDENDUM: Trace left apical pneumothorax better evaluated on CT C-spine 10/26/2023. Electronically Signed   By: Morgane  Naveau M.D.   On: 10/26/2023 20:57   Result Date: 10/26/2023 CLINICAL DATA:  fall, pain EXAM: LEFT RIBS AND CHEST - 3+ VIEW COMPARISON:  Chest x-ray 11/09/2022 FINDINGS: Wireless cardiac device overlies left chest. The heart and mediastinal contours are within normal limits. Atherosclerotic plaque. Left base atelectasis. No focal consolidation. No pulmonary edema. No pleural effusion. No pneumothorax. No acute displaced fracture or other bone lesions are seen involving the left ribs. No acute osseous abnormality. IMPRESSION: 1. No acute displaced left rib fractures. Please note, nondisplaced rib fractures may be occult on radiograph. 2. No acute cardiopulmonary abnormality. 3.  Aortic Atherosclerosis (ICD10-I70.0). Electronically Signed: By: Morgane  Naveau M.D. On: 10/26/2023 20:46   DG Hand Complete Left Result Date: 10/26/2023 CLINICAL DATA:  fall, pain EXAM: LEFT HAND - COMPLETE 3+ VIEW COMPARISON:  None Available. FINDINGS: There is no evidence of fracture or dislocation. There is no evidence of severe arthropathy or other focal bone abnormality. Soft tissues are unremarkable. IMPRESSION: No acute displaced fracture or dislocation. Electronically Signed   By: Morgane  Naveau M.D.   On: 10/26/2023 20:53    Microbiology: Results for orders placed or performed during the hospital encounter of 06/15/21  Resp Panel by RT-PCR (Flu A&B, Covid) Nasopharyngeal Swab     Status: None   Collection Time: 06/15/21  6:20 PM   Specimen: Nasopharyngeal Swab; Nasopharyngeal(NP) swabs in vial transport medium  Result Value Ref Range Status   SARS Coronavirus 2 by RT PCR NEGATIVE NEGATIVE Final    Comment:  (NOTE) SARS-CoV-2 target nucleic acids are NOT DETECTED.  The SARS-CoV-2 RNA is generally detectable in upper respiratory specimens during the acute phase of infection. The lowest concentration of SARS-CoV-2 viral copies this assay can detect is 138 copies/mL. A negative result does not preclude SARS-Cov-2 infection and should not be used as the sole basis for treatment or other patient management decisions. A negative result may occur with  improper specimen collection/handling, submission of specimen other than nasopharyngeal swab, presence of viral mutation(s) within the areas targeted by this assay, and inadequate number of viral copies(<138 copies/mL). A negative result must be combined with clinical observations, patient history, and epidemiological information. The expected result is Negative.  Fact Sheet for Patients:  BloggerCourse.com  Fact Sheet for Healthcare Providers:  SeriousBroker.it  This test is no t yet approved or cleared by the [place]  FDA and  has been authorized for detection and/or diagnosis of SARS-CoV-2 by FDA under an Emergency Use Authorization (EUA). This EUA will remain  in effect (meaning this test can be used) for the duration of the COVID-19 declaration under Section 564(b)(1) of the Act, 21 U.S.C.section 360bbb-3(b)(1), unless the authorization is terminated  or revoked sooner.       Influenza A by PCR NEGATIVE NEGATIVE Final   Influenza B by PCR NEGATIVE NEGATIVE Final    Comment: (NOTE) The Xpert Xpress SARS-CoV-2/FLU/RSV plus assay is intended as an aid in the diagnosis of influenza from  Nasopharyngeal swab specimens and should not be used as a sole basis for treatment. Nasal washings and aspirates are unacceptable for Xpert Xpress SARS-CoV-2/FLU/RSV testing.  Fact Sheet for Patients: BloggerCourse.com  Fact Sheet for Healthcare  Providers: SeriousBroker.it  This test is not yet approved or cleared by the [place]  FDA and has been authorized for detection and/or diagnosis of SARS-CoV-2 by FDA under an Emergency Use Authorization (EUA). This EUA will remain in effect (meaning this test can be used) for the duration of the COVID-19 declaration under Section 564(b)(1) of the Act, 21 U.S.C. section 360bbb-3(b)(1), unless the authorization is terminated or revoked.  Performed at [facility], [address] [place], [place] [address]     Labs: CBC: Recent Labs  Lab 10/26/23 2014 10/27/23 0532  WBC 9.9 8.3  NEUTROABS 7.1  --   HGB 12.9* 12.8*  HCT 39.2 38.8*  MCV 90.5 88.4  PLT 242 242   Basic Metabolic Panel: Recent Labs  Lab 10/26/23 2014  NA 134*  K 4.1  CL 98  CO2 28  GLUCOSE 130*  BUN 23  CREATININE 1.05  CALCIUM  9.1   Liver Function Tests: No results for input(s): "AST", "ALT", "ALKPHOS", "BILITOT", "PROT", "ALBUMIN" in the last 168 hours. CBG: Recent Labs  Lab 10/27/23 1214 10/27/23 1654 10/27/23 1742 10/27/23 2211 10/28/23 1238  GLUCAP 111* 95 98 127* 185*    Discharge time spent: greater than 30 minutes.  Signed: Verla Glaze, MD [facility] 10/28/2023

## 2023-10-28 NOTE — Progress Notes (Signed)
 Initial Nutrition Assessment  DOCUMENTATION CODES:   Not applicable  INTERVENTION:  Ensure Enlive po BID, each supplement provides 350 kcal and 20 grams of protein.  Multivitamin with minerals  NUTRITION DIAGNOSIS:   Increased nutrient needs related to acute illness as evidenced by estimated needs.  GOAL:   Patient will meet greater than or equal to 90% of their needs  MONITOR:   PO intake, Supplement acceptance, Weight trends  REASON FOR ASSESSMENT:   Consult Assessment of nutrition requirement/status  ASSESSMENT:   PMH PAF on Eliquis , history of CVA, T2DM, HTN, dementia, prostate cancer, baseline unsteady gait with prior Jeffrey Rojas who lives alone and has a daily caregiver for which he was hospitalized in May 2024 with an negative syncope workup, being admitted to observation for 5% apical pneumothorax resulting from an unwitnessed fall.  RD working remotely and unable to contact pt via telephone. CT scan negative for bleed or stroke. Chart notes reveal pt is lethargic likely with pain medication.   Intake: Nursing flowsheets document meal completions 80% x1.  Medications reviewed and include: SSI 0-9 units  Labs reviewed: Sodium 134 L, CBGs 95-127 x 24h, A1c 5.9%   Intake/Output Summary (Last 24 hours) at 10/28/2023 1128 Last data filed at 10/28/2023 1108 Gross per 24 hour  Intake 374.63 ml  Output 1250 ml  Net -875.37 ml     NUTRITION - FOCUSED PHYSICAL EXAM:  Defer to f/u  Diet Order:   Diet Order             Diet heart healthy/carb modified Room service appropriate? Yes; Fluid consistency: Thin  Diet effective now                   EDUCATION NEEDS:   Not appropriate for education at this time  Skin:  Skin Assessment: Skin Integrity Issues: Skin Integrity Issues:: Other (Comment) Other: skin tear- L hand  Last BM:  4/24  Height:   Ht Readings from Last 1 Encounters:  10/27/23 5\' 9"  (1.753 m)    Weight:   Wt Readings from Last 1  Encounters:  10/27/23 57 kg    BMI:  Body mass index is 18.55 kg/m.  Estimated Nutritional Needs:   Kcal:  1600-1850  Protein:  75-95  Fluid:  >1.6 L or per MD  Laren Player, MPH, RD, LDN Clinical Dietitian Contact information can be found at [facility].

## 2023-11-14 ENCOUNTER — Other Ambulatory Visit: Payer: Self-pay | Admitting: Internal Medicine

## 2023-11-14 DIAGNOSIS — I480 Paroxysmal atrial fibrillation: Secondary | ICD-10-CM

## 2023-11-14 NOTE — Telephone Encounter (Signed)
 Prescription refill request for Eliquis  received. Indication:afib Last office visit:needs appt Scr:n/a Age: 89 Weight:57  kg  Prescription refilled

## 2023-12-12 ENCOUNTER — Other Ambulatory Visit: Payer: Self-pay | Admitting: Internal Medicine

## 2023-12-12 DIAGNOSIS — I480 Paroxysmal atrial fibrillation: Secondary | ICD-10-CM

## 2023-12-12 NOTE — Telephone Encounter (Signed)
 Prescription refill request for Eliquis  received. Indication:afib Last office visit:needs appt Scr:1.05  4/25 Age: 89 Weight:57  kg  Prescription refilled

## 2024-01-11 ENCOUNTER — Other Ambulatory Visit: Payer: Self-pay | Admitting: Internal Medicine

## 2024-01-11 DIAGNOSIS — I480 Paroxysmal atrial fibrillation: Secondary | ICD-10-CM

## 2024-01-11 NOTE — Telephone Encounter (Signed)
 Called and spoke with pt's granddaughter and made her aware of overdue office visit. Transferred call the  scheduling.

## 2024-01-11 NOTE — Telephone Encounter (Signed)
 Prescription refill request for Eliquis  received. Indication: Afib  Last office visit: 12/13/21 Robyne)  Scr: 1.05 (10/26/23)  Age: 89 Weight: 57kg  Office visit overdue. Message sent to schedulers.

## 2024-01-14 NOTE — Telephone Encounter (Signed)
 Called pt, no answer. Left message on voicemail.

## 2024-01-22 ENCOUNTER — Encounter: Payer: Self-pay | Admitting: Internal Medicine

## 2024-01-22 ENCOUNTER — Telehealth: Payer: Self-pay | Admitting: Internal Medicine

## 2024-01-22 NOTE — Telephone Encounter (Signed)
 Patient contacted 3x to schedule overdue f/u, will send letter.

## 2024-01-22 NOTE — Telephone Encounter (Signed)
-----   Message from Nurse Lavanda HERO sent at 01/11/2024  7:54 AM EDT ----- Regarding: office visit overdue Received medication refill request. Pt is overdue for an office visit with provider. Please schedule appointment.   Thank you! - Anticoagulation Team

## 2024-01-28 ENCOUNTER — Telehealth: Payer: Self-pay

## 2024-01-28 NOTE — Telephone Encounter (Signed)
Left voicemail to schedule f/u appt

## 2024-01-28 NOTE — Telephone Encounter (Signed)
 Needs appt

## 2024-01-31 NOTE — Telephone Encounter (Signed)
 Tried to call pt  and leave a voice message for a follow up appointment for med refill

## 2024-03-20 ENCOUNTER — Emergency Department (HOSPITAL_COMMUNITY)

## 2024-03-20 ENCOUNTER — Inpatient Hospital Stay (HOSPITAL_COMMUNITY)
Admission: EM | Admit: 2024-03-20 | Discharge: 2024-03-25 | DRG: 682 | Disposition: A | Discharge status: Home Health | Attending: Hospitalist | Admitting: Hospitalist

## 2024-03-20 DIAGNOSIS — N179 Acute kidney failure, unspecified: Secondary | ICD-10-CM | POA: Diagnosis not present

## 2024-03-20 DIAGNOSIS — E785 Hyperlipidemia, unspecified: Secondary | ICD-10-CM | POA: Diagnosis present

## 2024-03-20 DIAGNOSIS — R4182 Altered mental status, unspecified: Secondary | ICD-10-CM | POA: Diagnosis not present

## 2024-03-20 DIAGNOSIS — Z7901 Long term (current) use of anticoagulants: Secondary | ICD-10-CM

## 2024-03-20 DIAGNOSIS — N390 Urinary tract infection, site not specified: Secondary | ICD-10-CM | POA: Diagnosis present

## 2024-03-20 DIAGNOSIS — Z8673 Personal history of transient ischemic attack (TIA), and cerebral infarction without residual deficits: Secondary | ICD-10-CM

## 2024-03-20 DIAGNOSIS — E871 Hypo-osmolality and hyponatremia: Secondary | ICD-10-CM | POA: Diagnosis present

## 2024-03-20 DIAGNOSIS — E872 Acidosis, unspecified: Secondary | ICD-10-CM | POA: Diagnosis present

## 2024-03-20 DIAGNOSIS — I10 Essential (primary) hypertension: Secondary | ICD-10-CM | POA: Diagnosis present

## 2024-03-20 DIAGNOSIS — E876 Hypokalemia: Secondary | ICD-10-CM | POA: Diagnosis present

## 2024-03-20 DIAGNOSIS — G9341 Metabolic encephalopathy: Secondary | ICD-10-CM | POA: Diagnosis present

## 2024-03-20 DIAGNOSIS — E860 Dehydration: Secondary | ICD-10-CM | POA: Diagnosis present

## 2024-03-20 DIAGNOSIS — I480 Paroxysmal atrial fibrillation: Secondary | ICD-10-CM | POA: Diagnosis present

## 2024-03-20 DIAGNOSIS — E875 Hyperkalemia: Secondary | ICD-10-CM | POA: Diagnosis present

## 2024-03-20 DIAGNOSIS — Z9079 Acquired absence of other genital organ(s): Secondary | ICD-10-CM

## 2024-03-20 DIAGNOSIS — E861 Hypovolemia: Secondary | ICD-10-CM | POA: Diagnosis present

## 2024-03-20 DIAGNOSIS — Z8546 Personal history of malignant neoplasm of prostate: Secondary | ICD-10-CM

## 2024-03-20 LAB — CBC WITH DIFFERENTIAL/PLATELET
Abs Immature Granulocytes: 0.02 K/uL (ref 0.00–0.07)
Basophils Absolute: 0 K/uL (ref 0.0–0.1)
Basophils Relative: 0 %
Eosinophils Absolute: 0 K/uL (ref 0.0–0.5)
Eosinophils Relative: 0 %
HCT: 43.5 % (ref 39.0–52.0)
Hemoglobin: 14 g/dL (ref 13.0–17.0)
Immature Granulocytes: 0 %
Lymphocytes Relative: 18 %
Lymphs Abs: 1.7 K/uL (ref 0.7–4.0)
MCH: 29.3 pg (ref 26.0–34.0)
MCHC: 32.2 g/dL (ref 30.0–36.0)
MCV: 91 fL (ref 80.0–100.0)
Monocytes Absolute: 1 K/uL (ref 0.1–1.0)
Monocytes Relative: 11 %
Neutro Abs: 6.6 K/uL (ref 1.7–7.7)
Neutrophils Relative %: 71 %
Platelets: 270 K/uL (ref 150–400)
RBC: 4.78 MIL/uL (ref 4.22–5.81)
RDW: 14.6 % (ref 11.5–15.5)
WBC: 9.3 K/uL (ref 4.0–10.5)
nRBC: 0 % (ref 0.0–0.2)

## 2024-03-20 LAB — COMPREHENSIVE METABOLIC PANEL WITH GFR
ALT: 13 U/L (ref 0–44)
AST: 21 U/L (ref 15–41)
Albumin: 3.7 g/dL (ref 3.5–5.0)
Alkaline Phosphatase: 126 U/L (ref 38–126)
Anion gap: 13 (ref 5–15)
BUN: 35 mg/dL — ABNORMAL HIGH (ref 8–23)
CO2: 24 mmol/L (ref 22–32)
Calcium: 9.6 mg/dL (ref 8.9–10.3)
Chloride: 95 mmol/L — ABNORMAL LOW (ref 98–111)
Creatinine, Ser: 3.11 mg/dL — ABNORMAL HIGH (ref 0.61–1.24)
GFR, Estimated: 19 mL/min — ABNORMAL LOW (ref >60)
Glucose, Bld: 86 mg/dL (ref 70–99)
Potassium: 5.1 mmol/L (ref 3.5–5.1)
Sodium: 132 mmol/L — ABNORMAL LOW (ref 135–145)
Total Bilirubin: 0.9 mg/dL (ref 0.0–1.2)
Total Protein: 7.3 g/dL (ref 6.5–8.1)

## 2024-03-20 LAB — I-STAT CG4 LACTIC ACID, ED
Lactic Acid, Venous: 3.2 mmol/L (ref 0.5–1.9)
Lactic Acid, Venous: 3.1 mmol/L (ref 0.5–1.9)

## 2024-03-20 LAB — CBG MONITORING, ED: Glucose-Capillary: 83 mg/dL (ref 70–99)

## 2024-03-20 LAB — PROTIME-INR
INR: 1.5 — ABNORMAL HIGH (ref 0.8–1.2)
Prothrombin Time: 18.5 s — ABNORMAL HIGH (ref 11.4–15.2)

## 2024-03-20 LAB — TROPONIN I (HIGH SENSITIVITY)
Troponin I (High Sensitivity): 8 ng/L (ref <18)
Troponin I (High Sensitivity): 7 ng/L (ref <18)

## 2024-03-20 MED ORDER — SODIUM CHLORIDE 0.9 % IV SOLN: INTRAVENOUS | Status: DC

## 2024-03-20 MED ORDER — AMIODARONE LOAD VIA INFUSION
150.0000 mg | Freq: Once | INTRAVENOUS | Status: DC
  Filled 2024-03-20: qty 83.34

## 2024-03-20 MED ORDER — AMIODARONE HCL IN DEXTROSE 360-4.14 MG/200ML-% IV SOLN: 30.0000 mg/h | INTRAVENOUS | Status: DC

## 2024-03-20 MED ORDER — POLYETHYLENE GLYCOL 3350 17 G PO PACK: 17.0000 g | PACK | Freq: Every day | ORAL | Status: DC | PRN

## 2024-03-20 MED ORDER — MELATONIN 5 MG PO TABS
5.0000 mg | ORAL_TABLET | Freq: Every evening | ORAL | Status: DC | PRN
  Filled 2024-03-20: qty 1

## 2024-03-20 MED ORDER — HEPARIN SODIUM (PORCINE) 5000 UNIT/ML IJ SOLN: 5000.0000 [IU] | Freq: Three times a day (TID) | INTRAMUSCULAR | Status: DC

## 2024-03-20 MED ORDER — APIXABAN 2.5 MG PO TABS
2.5000 mg | ORAL_TABLET | Freq: Two times a day (BID) | ORAL | Status: DC
  Administered 2024-03-25: 2.5 mg via ORAL
  Filled 2024-03-20 (×8): qty 1

## 2024-03-20 MED ORDER — ATORVASTATIN CALCIUM 10 MG PO TABS
20.0000 mg | ORAL_TABLET | Freq: Every day | ORAL | Status: DC
Start: 2024-03-20 — End: 2024-03-25
  Administered 2024-03-25: 20 mg via ORAL
  Filled 2024-03-20 (×4): qty 2

## 2024-03-20 MED ORDER — PROCHLORPERAZINE EDISYLATE 10 MG/2ML IJ SOLN: 5.0000 mg | Freq: Four times a day (QID) | INTRAMUSCULAR | Status: DC | PRN

## 2024-03-20 MED ORDER — AMIODARONE HCL IN DEXTROSE 360-4.14 MG/200ML-% IV SOLN: 60.0000 mg/h | INTRAVENOUS | Status: DC

## 2024-03-20 MED ORDER — ACETAMINOPHEN 325 MG PO TABS: 650.0000 mg | ORAL_TABLET | Freq: Four times a day (QID) | ORAL | Status: DC | PRN

## 2024-03-20 MED ORDER — LACTATED RINGERS IV BOLUS: 1000.0000 mL | Freq: Once | INTRAVENOUS | Status: AC

## 2024-03-20 NOTE — ED Triage Notes (Signed)
 TO ED via [facility], from home-- was found in chair by caregiver at 1pm--- they said there was evidence of a fall - with blood in the bathroom. Pt has a small cut to right middle finger. Not bleeding at present-  Hx - CVA  LVO+ 2 for EMS- unknown LKW .   Per family- pt's speech is not normal , GCS on arrival - 14 Confusion to date/time.

## 2024-03-20 NOTE — ED Notes (Signed)
 Lactic results to jon c.rn by at

## 2024-03-20 NOTE — H&P (Addendum)
 History and Physical  Jeffrey Rojas FMW:[id number] DOB: January 23, 1935 DOA: 03/20/2024  Referring physician: Daralene Bruckner, PA-EDP  PCP: Jeffrey Norleen BIRCH, MD  Outpatient Specialists: Cardiology. Patient coming from: Home.  Chief Complaint: Altered mental status.  HPI: Jeffrey Rojas is a 89 y.o. male with medical history significant for paroxysmal A-fib on Eliquis , hypertension, hyperlipidemia, history of CVA in 2019, history of prostate cancer status post prostatectomy, who presents to the ER via EMS from home due to altered mental status.  The patient is unable to provide a history because of this.  Unable to reach family despite multiple attempts.  Per EMS, the patient may have had a fall at home.  He was found in a chair by caregiver around 1 PM with confusion.  Reportedly there was blood in the bathroom and a cut on his right middle finger.  Per EMS, the caregiver did note that the patient seemed to be slurring his speech.  Last known well is unknown.  No reported fever or chills.  No cardiopulmonary or GI symptoms reported.  In the ER, noncontrast head CT was nonacute.  It showed remote left PCA territory infarct.  Chest x-ray showing no acute intrathoracic process, pelvic x-ray showing no acute pelvic fracture.  Labs were notable for elevated creatinine 3.11, from baseline of 1 just 4 months ago, lactic acidosis, 3.2.  IV fluids were initiated in the ER, LR 1 L x 1  ED Course: Temperature 97.3.  BP 114/68, pulse 68, respiratory rate 14, O2 saturation 98% on room air.  Lab studies notable for serum sodium 132, BUN 35, creatinine 3.11, GFR 19.  Lactic acid 3.2, 3.1.  Review of Systems: Review of systems as noted in the HPI. All other systems reviewed and are negative.   Past medical history: Paroxysmal A-fib Prostate cancer Hypertension, hyperlipidemia, stroke  Social History:  has no history on file for tobacco use, alcohol use, and drug use.  Family history: None reported.  Home  medications, Eliquis  2.5 mg twice daily   Physical Exam: BP 118/68   Pulse 62   Temp (!) 97.3 F (36.3 C) (Oral)   Resp 14   SpO2 95%   General: 89 y.o. year-old male well developed well nourished in no acute distress.  Alert and oriented x1. Cardiovascular: Regular rate and rhythm with no rubs or gallops.  No thyromegaly or JVD noted.  No lower extremity edema. 2/4 pulses in all 4 extremities. Respiratory: Clear to auscultation with no wheezes or rales. Good inspiratory effort. Abdomen: Soft nontender nondistended with normal bowel sounds x4 quadrants. Muskuloskeletal: No cyanosis, clubbing or edema noted bilaterally Neuro: CN II-XII intact, strength, sensation, reflexes Skin: No ulcerative lesions noted or rashes Psychiatry: Judgement and insight appear altered. Mood is appropriate for condition and setting          Labs on Admission:  Basic Metabolic Panel: Recent Labs  Lab 03/20/24 1653  NA 132*  K 5.1  CL 95*  CO2 24  GLUCOSE 86  BUN 35*  CREATININE 3.11*  CALCIUM  9.6   Liver Function Tests: Recent Labs  Lab 03/20/24 1653  AST 21  ALT 13  ALKPHOS 126  BILITOT 0.9  PROT 7.3  ALBUMIN 3.7   No results for input(s): LIPASE, AMYLASE in the last 168 hours. No results for input(s): AMMONIA in the last 168 hours. CBC: Recent Labs  Lab 03/20/24 1653  WBC 9.3  NEUTROABS 6.6  HGB 14.0  HCT 43.5  MCV 91.0  PLT 270  Cardiac Enzymes: No results for input(s): CKTOTAL, CKMB, CKMBINDEX, TROPONINI in the last 168 hours.  BNP (last 3 results) No results for input(s): BNP in the last 8760 hours.  ProBNP (last 3 results) No results for input(s): PROBNP in the last 8760 hours.  CBG: Recent Labs  Lab 03/20/24 1745  GLUCAP 83    Radiological Exams on Admission: CT Head Wo Contrast Result Date: 03/20/2024 CLINICAL DATA:  AMS EXAM: CT HEAD WITHOUT CONTRAST TECHNIQUE: Contiguous axial images were obtained from the base of the skull through  the vertex without intravenous contrast. RADIATION DOSE REDUCTION: This exam was performed according to the departmental dose-optimization program which includes automated exposure control, adjustment of the mA and/or kV according to patient size and/or use of iterative reconstruction technique. COMPARISON:  None Available. FINDINGS: Brain: Remote left PCA territory infarct. No evidence of acute large vascular territory infarct, acute hemorrhage, mass lesion, midline shift or hydrocephalus. Cerebral atrophy. Vascular: No hyperdense vessel. Skull: No acute fracture. Sinuses/Orbits: Clear sinuses.  No acute orbital findings. Other: No mastoid effusions. IMPRESSION: 1. No evidence of acute intracranial abnormality. 2. Remote left PCA territory infarct. Electronically Signed   By: Jeffrey Rojas M.D.   On: 03/20/2024 19:50   DG Pelvis Portable Result Date: 03/20/2024 CLINICAL DATA:  [name] EXAM: PORTABLE PELVIS 1-2 VIEWS COMPARISON:  None Available. FINDINGS: Supine frontal view of the pelvis was obtained, excluding portions of the proximal right femur by collimation. No acute fracture, subluxation, or dislocation. Symmetrical bilateral hip osteoarthritis. Sacroiliac joints are unremarkable. Penile prosthesis incidentally noted. IMPRESSION: 1. No acute pelvic fracture. Electronically Signed   By: Jeffrey Daring M.D.   On: 03/20/2024 19:39   DG Chest Portable 1 View Result Date: 03/20/2024 CLINICAL DATA:  Altered level of consciousness, fell EXAM: PORTABLE CHEST 1 VIEW COMPARISON:  None Available. FINDINGS: Single frontal view of the chest demonstrates an unremarkable cardiac silhouette. Ectasia and atherosclerosis of the thoracic aorta. No acute airspace disease, effusion, or pneumothorax. No acute displaced fracture. IMPRESSION: 1. No acute intrathoracic process. Electronically Signed   By: Jeffrey Daring M.D.   On: 03/20/2024 19:38    EKG: I independently viewed the EKG done and my findings are as followed:  Sinus rhythm rate of 69.  Nonspecific ST changes.  QTc 441.  Assessment/Plan Present on Admission:  AMS (altered mental status)  Principal Problem:   AMS (altered mental status)  Acute metabolic encephalopathy, unclear etiology Continue to treat underlying conditions as they present Follow urine analysis Reorient as needed Fall precautions  AKI, suspect multifactorial BUN 35, creatinine 3.11 Baseline creatinine 1.0 GFR greater than 60 Continue IV fluid hydration Monitor urine output Avoid nephrotoxic agents, dehydration, and hypotension.  Lactic acidosis, suspect secondary to dehydration Lactic acid 3.2 Continue IV fluid hydration Repeat lactic acid to confirm clearance  Hypovolemic hyponatremia Serum sodium 132 Continue NS at 75 cc/h x 1 day Repeat BMP in the morning  Paroxysmal A-fib on Eliquis  Resume home Eliquis  Monitor on telemetry  Possible fall prior to admission PT OT evaluation Fall precautions  History of CVA Noncontrast head CT 03/20/24 revealed remote left PCA territory infarct  Hyperlipidemia Resume home Lipitor  Hypertension Hold off home oral antihypertensives for now due to soft blood pressures Close monitoring of vital signs.   Time: 75 minutes   DVT prophylaxis: Home Eliquis  2.5 mg twice daily  Code Status: Full code  Family Communication: None at bedside.  Disposition Plan: Admitted to telemetry medical unit.  Consults called: None.  Admission  status: Observation status.   Status is: Observation    Terry LOISE Hurst MD [facility] Pager [phone]  If 7PM-7AM, please contact night-coverage [email or web address] Password [facility]  03/20/2024, 8:26 PM

## 2024-03-20 NOTE — ED Notes (Signed)
 EDPA at [facility]. Pt alert, NAD, calm, interactive, participatory, cooperative.

## 2024-03-20 NOTE — Progress Notes (Signed)
 Pt arrived to the unit with no IV access, a lactic acid of 3.1 and an erection. Pt is only alert to self. Attempted to call ED charge RN, no answer. Spoke with [facility] and notified Evalene Sprinkles, MD.

## 2024-03-20 NOTE — ED Notes (Signed)
 Patient transported to MRI

## 2024-03-20 NOTE — ED Provider Notes (Signed)
 [place] EMERGENCY DEPARTMENT AT [facility] Provider Note   CSN: [id number] Arrival date & time: 03/20/24  1626     Patient presents with: ams   Jeffrey Rojas is a 89 y.o. male.   Patient is an 89 year old male who presents to the emergency department from home secondary to altered mental status.  He was found to be in his chair by a caregiver who believe that he had fallen because he has a small cut to his right middle finger and blood was noted in the bathroom.  It is unsure exactly how long the patient has been altered for.  Family did note that the patient seemed to be slurring his speech as well.  On presentation the patient denies any active complaints at this point to include chest pain, shortness breath, abdominal pain, nausea, vomiting, diarrhea, headache.  He does have his right middle finger bandaged.        Prior to Admission medications   Not on File    Allergies: Patient has no allergy information on record.    Review of Systems  Psychiatric/Behavioral:  Positive for confusion.     Updated Vital Signs BP 114/68   Pulse 68   Temp (!) 97.3 F (36.3 C) (Oral)   Resp 15   SpO2 98%   Physical Exam Vitals and nursing note reviewed.  Constitutional:      General: He is not in acute distress.    Appearance: Normal appearance. He is not ill-appearing.  HENT:     Head: Normocephalic and atraumatic.     Nose: Nose normal.     Mouth/Throat:     Mouth: Mucous membranes are moist.  Eyes:     Extraocular Movements: Extraocular movements intact.     Conjunctiva/sclera: Conjunctivae normal.     Pupils: Pupils are equal, round, and reactive to light.  Cardiovascular:     Rate and Rhythm: Normal rate and regular rhythm.     Pulses: Normal pulses.     Heart sounds: Normal heart sounds. No murmur heard.    No gallop.  Pulmonary:     Effort: Pulmonary effort is normal. No respiratory distress.     Breath sounds: Normal breath sounds. No stridor. No  wheezing, rhonchi or rales.  Abdominal:     General: Abdomen is flat. Bowel sounds are normal. There is no distension.     Palpations: Abdomen is soft.     Tenderness: There is no abdominal tenderness. There is no guarding.  Musculoskeletal:        General: No swelling, tenderness, deformity or signs of injury. Normal range of motion.     Cervical back: Normal range of motion and neck supple. No rigidity or tenderness.     Right lower leg: No edema.     Left lower leg: No edema.  Skin:    General: Skin is warm and dry.     Findings: No bruising or rash.     Comments: Very small superficial laceration noted to right middle finger along the palmar aspect  Neurological:     General: No focal deficit present.     Mental Status: He is alert and oriented to person, place, and time. Mental status is at baseline.     Cranial Nerves: No cranial nerve deficit.     Sensory: No sensory deficit.     Motor: No weakness.     Coordination: Coordination normal.     Gait: Gait normal.     Comments: Normal  finger-nose, normal heel-to-shin, normal rapid alternating movements  Psychiatric:        Mood and Affect: Mood normal.        Behavior: Behavior normal.        Thought Content: Thought content normal.        Judgment: Judgment normal.     (all labs ordered are listed, but only abnormal results are displayed) Labs Reviewed  CULTURE, BLOOD (ROUTINE X 2)  CULTURE, BLOOD (ROUTINE X 2)  COMPREHENSIVE METABOLIC PANEL WITH GFR  CBC WITH DIFFERENTIAL/PLATELET  PROTIME-INR  URINALYSIS, ROUTINE W REFLEX MICROSCOPIC  I-STAT CG4 LACTIC ACID, ED  CBG MONITORING, ED  TROPONIN I (HIGH SENSITIVITY)    EKG: None  Radiology: No results found.   Procedures   Medications Ordered in the ED - No data to display                                  Medical Decision Making Amount and/or Complexity of Data Reviewed Labs: ordered. Radiology: ordered.  Risk Decision regarding  hospitalization.   This patient presents to the ED for concern of altered mental status differential diagnosis includes dehydration, acute renal failure, pneumonia, urinary tract infection, sepsis, CVA, TIA, meningitis, encephalitis    Additional history obtained:  Additional history obtained from medical records External records from outside source obtained and reviewed including medical records   Lab Tests:  I Ordered, and personally interpreted labs.  The pertinent results include: No leukocytosis, no anemia, elevated creatinine, mild hyponatremia, normal liver function, elevated lactic acid, negative troponin   Imaging Studies ordered:  I ordered imaging studies including CT scan head, pelvis x-ray, chest x-ray I independently visualized and interpreted imaging which showed no acute intracranial hemorrhage, old PCA infarct, no pelvic fracture, no acute cardiopulmonary process I agree with the radiologist interpretation   Medicines ordered and prescription drug management:  I ordered medication including IV fluids for acute kidney injury Reevaluation of the patient after these medicines showed that the patient improved I have reviewed the patients home medicines and have made adjustments as needed   Problem List / ED Course:  Patient is doing well at this time and does remain stable.  He does remain altered at this point.  He has no obvious focal neurological deficits on exam and his last known well is unknown.  We have attempted to reach out to family without success.  Patient does appear to have a new acute kidney injury and he has been started on IV fluids.  He does have a lactic acidosis which I do suspect is secondary to dehydration.  No other clear infectious sources noted at this point.  Will cath for urine.  Vital signs have remained stable and otherwise low suspicion for sepsis.  Will avoid IV antibiotics at this time.  Have discussed patient case with Dr. Shona with the  hospitalist service who has excepted for admission.   Social Determinants of Health:  None        Final diagnoses:  None    ED Discharge Orders     None          Daralene Lonni JONETTA DEVONNA 03/20/24 2026    Jeffrey Fonda MATSU, MD 03/20/24 [phone]

## 2024-03-21 DIAGNOSIS — E861 Hypovolemia: Secondary | ICD-10-CM | POA: Diagnosis present

## 2024-03-21 DIAGNOSIS — N179 Acute kidney failure, unspecified: Secondary | ICD-10-CM | POA: Diagnosis present

## 2024-03-21 DIAGNOSIS — R4182 Altered mental status, unspecified: Secondary | ICD-10-CM | POA: Diagnosis present

## 2024-03-21 DIAGNOSIS — E872 Acidosis, unspecified: Secondary | ICD-10-CM | POA: Diagnosis present

## 2024-03-21 DIAGNOSIS — Z8673 Personal history of transient ischemic attack (TIA), and cerebral infarction without residual deficits: Secondary | ICD-10-CM | POA: Diagnosis not present

## 2024-03-21 DIAGNOSIS — G9341 Metabolic encephalopathy: Secondary | ICD-10-CM | POA: Diagnosis present

## 2024-03-21 DIAGNOSIS — I480 Paroxysmal atrial fibrillation: Secondary | ICD-10-CM | POA: Diagnosis present

## 2024-03-21 DIAGNOSIS — Z8546 Personal history of malignant neoplasm of prostate: Secondary | ICD-10-CM | POA: Diagnosis not present

## 2024-03-21 DIAGNOSIS — N390 Urinary tract infection, site not specified: Secondary | ICD-10-CM | POA: Diagnosis present

## 2024-03-21 DIAGNOSIS — I10 Essential (primary) hypertension: Secondary | ICD-10-CM | POA: Diagnosis present

## 2024-03-21 DIAGNOSIS — Z7901 Long term (current) use of anticoagulants: Secondary | ICD-10-CM | POA: Diagnosis not present

## 2024-03-21 DIAGNOSIS — E785 Hyperlipidemia, unspecified: Secondary | ICD-10-CM | POA: Diagnosis present

## 2024-03-21 DIAGNOSIS — E871 Hypo-osmolality and hyponatremia: Secondary | ICD-10-CM | POA: Diagnosis present

## 2024-03-21 DIAGNOSIS — Z9079 Acquired absence of other genital organ(s): Secondary | ICD-10-CM | POA: Diagnosis not present

## 2024-03-21 DIAGNOSIS — E860 Dehydration: Secondary | ICD-10-CM | POA: Diagnosis present

## 2024-03-21 DIAGNOSIS — E876 Hypokalemia: Secondary | ICD-10-CM | POA: Diagnosis present

## 2024-03-21 DIAGNOSIS — E875 Hyperkalemia: Secondary | ICD-10-CM | POA: Diagnosis present

## 2024-03-21 LAB — BASIC METABOLIC PANEL WITH GFR
Anion gap: 8 (ref 5–15)
Anion gap: 9 (ref 5–15)
BUN: 36 mg/dL — ABNORMAL HIGH (ref 8–23)
BUN: 38 mg/dL — ABNORMAL HIGH (ref 8–23)
CO2: 27 mmol/L (ref 22–32)
CO2: 26 mmol/L (ref 22–32)
Calcium: 9.2 mg/dL (ref 8.9–10.3)
Calcium: 9.1 mg/dL (ref 8.9–10.3)
Chloride: 100 mmol/L (ref 98–111)
Chloride: 97 mmol/L — ABNORMAL LOW (ref 98–111)
Creatinine, Ser: 3.43 mg/dL — ABNORMAL HIGH (ref 0.61–1.24)
Creatinine, Ser: 3.45 mg/dL — ABNORMAL HIGH (ref 0.61–1.24)
GFR, Estimated: 17 mL/min — ABNORMAL LOW (ref >60)
GFR, Estimated: 17 mL/min — ABNORMAL LOW (ref >60)
Glucose, Bld: 93 mg/dL (ref 70–99)
Glucose, Bld: 139 mg/dL — ABNORMAL HIGH (ref 70–99)
Potassium: 5.2 mmol/L — ABNORMAL HIGH (ref 3.5–5.1)
Potassium: 5 mmol/L (ref 3.5–5.1)
Sodium: 135 mmol/L (ref 135–145)
Sodium: 132 mmol/L — ABNORMAL LOW (ref 135–145)

## 2024-03-21 LAB — CBC
HCT: 42.8 % (ref 39.0–52.0)
Hemoglobin: 13.9 g/dL (ref 13.0–17.0)
MCH: 29 pg (ref 26.0–34.0)
MCHC: 32.5 g/dL (ref 30.0–36.0)
MCV: 89.4 fL (ref 80.0–100.0)
Platelets: 266 K/uL (ref 150–400)
RBC: 4.79 MIL/uL (ref 4.22–5.81)
RDW: 14.3 % (ref 11.5–15.5)
WBC: 8.1 K/uL (ref 4.0–10.5)
nRBC: 0 % (ref 0.0–0.2)

## 2024-03-21 LAB — MAGNESIUM: Magnesium: 1.7 mg/dL (ref 1.7–2.4)

## 2024-03-21 LAB — LACTIC ACID, PLASMA
Lactic Acid, Venous: 2.6 mmol/L (ref 0.5–1.9)
Lactic Acid, Venous: 1.5 mmol/L (ref 0.5–1.9)

## 2024-03-21 LAB — PHOSPHORUS: Phosphorus: 3.6 mg/dL (ref 2.5–4.6)

## 2024-03-21 NOTE — Plan of Care (Signed)

## 2024-03-21 NOTE — Evaluation (Signed)
 Occupational Therapy Evaluation Patient Details Name: Jeffrey Rojas MRN: [id number] DOB: 10/03/1934 Today's Date: 03/21/2024   History of Present Illness   89 y.o. male presents from home due to altered mental status. Pt with Acute metabolic encephalopathy, lactic acidosis, AKI, possible fall. PMH: paroxysmal A-fib on Eliquis , hypertension, hyperlipidemia, history of CVA in 2019, history of prostate cancer status post prostatectomy.     Clinical Impressions Pt unreliable historian, oriented to self only. States he lives with his wife who is apparently deceased. Able to contact nephew on number listed on chart however he was unable to give PLOF information and gave the number for his grand daughter Jeffrey Rojas [phone]), who did not answer when called x2. Per nursing, Clemence has a caregiver at times but unsure of situation. Pt overall min A with mobility and mod A with ADL tasks @ RW level and is a significant fall risk. Patient will benefit from continued inpatient follow up therapy, <3 hours/day to maximize functional level of independence. Acute OT to follow.      If plan is discharge home, recommend the following:   A little help with walking and/or transfers;A little help with bathing/dressing/bathroom;Assistance with cooking/housework;Direct supervision/assist for medications management;Direct supervision/assist for financial management;Assist for transportation;Help with stairs or ramp for entrance     Functional Status Assessment   Patient has had a recent decline in their functional status and demonstrates the ability to make significant improvements in function in a reasonable and predictable amount of time.     Equipment Recommendations   Other (comment) (TBD)     Recommendations for Other Services         Precautions/Restrictions   Precautions Precautions: Fall     Mobility Bed Mobility Overal bed mobility: Needs Assistance Bed Mobility: Supine to Sit      Supine to sit: Min assist          Transfers Overall transfer level: Needs assistance Equipment used: Rolling walker (2 wheels) Transfers: Sit to/from Stand Sit to Stand: Min assist           General transfer comment: ambulated around bed to chair      Balance Overall balance assessment: Needs assistance (possible fall PTA) Sitting-balance support: Feet supported Sitting balance-Leahy Scale: Fair       Standing balance-Leahy Scale: Poor                             ADL either performed or assessed with clinical judgement   ADL Overall ADL's : Needs assistance/impaired Eating/Feeding: Set up;Sitting   Grooming: Minimal assistance   Upper Body Bathing: Minimal assistance;Sitting   Lower Body Bathing: Moderate assistance   Upper Body Dressing : Minimal assistance   Lower Body Dressing: Moderate assistance   Toilet Transfer: Minimal assistance;Ambulation   Toileting- Clothing Manipulation and Hygiene: Total assistance (incontinent)       Functional mobility during ADLs: Minimal assistance;Rolling walker (2 wheels);Cueing for safety       Vision   Additional Comments: will further assess     Perception         Praxis         Pertinent Vitals/Pain Pain Assessment Pain Assessment: Faces Faces Pain Scale: No hurt     Extremity/Trunk Assessment Upper Extremity Assessment Upper Extremity Assessment: Generalized weakness   Lower Extremity Assessment Lower Extremity Assessment: Defer to PT evaluation   Cervical / Trunk Assessment Cervical / Trunk Assessment: Normal   Communication Communication Communication:  Other (comment);Impaired Factors Affecting Communication: Hearing impaired   Cognition Arousal: Alert Behavior During Therapy: Flat affect Cognition: Cognition impaired, No family/caregiver present to determine baseline   Orientation impairments: Place, Time, Situation Awareness: Intellectual awareness impaired, Online  awareness impaired Memory impairment (select all impairments): Short-term memory, 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[employer], Engineer, structural memory Attention impairment (select first level of impairment): Sustained attention Executive functioning impairment (select all impairments): Initiation, Organization, Sequencing, Reasoning, Problem solving                   Following commands: Impaired Following commands impaired: Only follows one step commands consistently     Cueing  General Comments   Cueing Techniques: Verbal cues;Visual cues  wound L finger   Exercises     Shoulder Instructions      Home Living Family/patient expects to be discharged to:: Private residence Living Arrangements: Alone                               Additional Comments: Pt unable to give information and contact in chart stated he was unabl eto give information and gace grand daughter's number  - called however no answer; apparently pt has caregivers during the day - unsure      Prior Functioning/Environment Prior Level of Function : Patient poor historian/Family not available                    OT Problem List: Decreased strength;Impaired balance (sitting and/or standing);Decreased cognition;Decreased safety awareness;Decreased knowledge of use of DME or AE   OT Treatment/Interventions: Self-care/ADL training;Therapeutic exercise;DME and/or AE instruction;Therapeutic activities;Cognitive remediation/compensation;Patient/family education;Balance training      OT Goals(Current goals can be found in the care plan section)   Acute Rehab OT Goals Patient Stated Goal: none stated OT Goal Formulation: Patient unable to participate in goal setting Time For Goal Achievement: 04/04/24 Potential to Achieve Goals: Good   OT Frequency:  Min 2X/week    Co-evaluation              AM-PAC OT 6 Clicks Daily Activity     Outcome Measure Help from another person eating meals?: A Little Help  from another person taking care of personal grooming?: A Little Help from another person toileting, which includes using toliet, bedpan, or urinal?: Total Help from another person bathing (including washing, rinsing, drying)?: A Lot Help from another person to put on and taking off regular upper body clothing?: A Little Help from another person to put on and taking off regular lower body clothing?: A Lot 6 Click Score: 14   End of Session Equipment Utilized During Treatment: Gait belt;Rolling walker (2 wheels) Nurse Communication: Mobility status  Activity Tolerance: Patient tolerated treatment well Patient left: in chair;with call bell/phone within reach;with chair alarm set (no batteries - nsg aware and has ordered)  OT Visit Diagnosis: Unsteadiness on feet (R26.81);Other abnormalities of gait and mobility (R26.89);Muscle weakness (generalized) (M62.81);Other symptoms and signs involving cognitive function                Time: 1106-1140 OT Time Calculation (min): 34 min Charges:  OT General Charges $OT Visit: 1 Visit OT Evaluation $OT Eval Moderate Complexity: 1 Mod OT Treatments $Self Care/Home Management : 8-22 mins  Kreg Sink, OT/L   Acute OT Clinical Specialist Acute Rehabilitation Services Pager [phone] Office [phone]   [facility] 03/21/2024, 11:58 AM

## 2024-03-21 NOTE — Progress Notes (Signed)
 PROGRESS NOTE    Jeffrey Rojas  FMW:[id number] DOB: 08-Sep-1934 DOA: 03/20/2024 PCP: Rudolpho Norleen BIRCH, MD   Brief Narrative:    Assessment & Plan:   Principal Problem:   AMS (altered mental status) Active Problems:   AKI (acute kidney injury) (HCC)   Paroxysmal atrial fibrillation (HCC)   HTN (hypertension)   Hyperlipidemia   Jeffrey Rojas is a 89 y.o. male with medical history significant for paroxysmal A-fib on Eliquis , hypertension, hyperlipidemia, history of CVA in 2019, history of prostate cancer status post prostatectomy, who presents to the ER via EMS from home due to altered mental status.  The patient is unable to provide a history because of this.  Unable to reach family despite multiple attempts.  Per EMS, the patient may have had a fall at home.  He was found in a chair by caregiver around 1 PM with confusion.  Reportedly there was blood in the bathroom and a cut on his right middle finger.    Patient was afebrile.  Lactic acid was elevated to 3.1 along with creatinine.  Baseline creatinine is 1.  Head CT was negative for acute findings.  Patient admitted for further management.   Assessment/Plan Present on Admission:  AMS (altered mental status)    Acute metabolic encephalopathy, unclear etiology Patient at this time is alert but disoriented.  He was sitting on chair, having lunch. Head CT negative for acute findings, remote stroke noted. Will follow urinalysis Reorient as needed Fall precautions   AKI, suspect multifactorial BUN 35, creatinine 3.11 on admission, has worsened to 3.4 despite IV fluids. Baseline creatinine 1.0 GFR greater than 60 Continue IV fluid hydration, will continue NS at 75 cc/h. Will obtain CT abdomen pelvis without contrast to rule out postrenal causes in the setting of prostate cancer history.   Monitor urine output Avoid nephrotoxic agents, dehydration, and hypotension.   Lactic acidosis, suspect secondary to dehydration Lactic acid  3.2 has cleared.  Continue IV fluid hydration Low suspicion of sepsis and will hold off on antibiotics.  Hypovolemic hyponatremia Serum sodium 132 Continue NS at 75 cc/h x Repeat BMP in the morning  Hypokalemia: Potassium 5.2.  Will repeat BMP today   Paroxysmal A-fib on Eliquis  Resume home Eliquis  Monitor on telemetry   Possible fall prior to admission PT OT evaluation Fall precautions   History of CVA Noncontrast head CT 03/20/24 revealed remote left PCA territory infarct   Hyperlipidemia Resume home Lipitor   Hypertension Hold off home oral antihypertensives for now due to soft blood pressures Close monitoring of vital signs.   DVT prophylaxis:  Code Status: Full code Family Communication: none at the bedside Disposition Plan: Status is: Observation The patient will require care spanning > 2 midnights and should be moved to inpatient because: Persistent AKI , mild hyperkalemia with need for continuous IV fluid and further evaluation and monitoring    Consultants:   Procedures:   Antimicrobials:    Subjective: Patient seen and examined at the bedside.  He is on chair having lunch.  He is unable to tell me where he is.  He repeats I feel fine   objective: Vitals:   03/21/24 0015 03/21/24 0446 03/21/24 0812 03/21/24 1208  BP: (!) 149/77 (!) 149/77 131/67 124/66  Pulse: 73 67 81 71  Resp: 16 16    Temp: (!) 97.4 F (36.3 C) 97.6 F (36.4 C) 97.6 F (36.4 C)   TempSrc: Oral  Oral   SpO2: 100% 100% 98% 99%  Intake/Output Summary (Last 24 hours) at 03/21/2024 1311 Last data filed at 03/21/2024 0500 Gross per 24 hour  Intake 1435.41 ml  Output --  Net 1435.41 ml   There were no vitals filed for this visit.  Examination:  General exam: Appears calm and comfortable.  Confused Respiratory system: Bilateral decreased breath sounds at bases Cardiovascular system: S1 & S2 heard, Rate controlled Gastrointestinal system: Abdomen is nondistended, soft  and nontender. Normal bowel sounds heard.  Central nervous system: Alert and confused.  No focal neurological deficits. Moving extremities     Data Reviewed: I have personally reviewed following labs and imaging studies  CBC: Recent Labs  Lab 03/20/24 1653 03/21/24 0433  WBC 9.3 8.1  NEUTROABS 6.6  --   HGB 14.0 13.9  HCT 43.5 42.8  MCV 91.0 89.4  PLT 270 266   Basic Metabolic Panel: Recent Labs  Lab 03/20/24 1653 03/21/24 0433  NA 132* 135  K 5.1 5.2*  CL 95* 100  CO2 24 27  GLUCOSE 86 93  BUN 35* 36*  CREATININE 3.11* 3.43*  CALCIUM  9.6 9.2  MG  --  1.7  PHOS  --  3.6   GFR: CrCl cannot be calculated (Unknown ideal weight.). Liver Function Tests: Recent Labs  Lab 03/20/24 1653  AST 21  ALT 13  ALKPHOS 126  BILITOT 0.9  PROT 7.3  ALBUMIN 3.7   No results for input(s): LIPASE, AMYLASE in the last 168 hours. No results for input(s): AMMONIA in the last 168 hours. Coagulation Profile: Recent Labs  Lab 03/20/24 1653  INR 1.5*   Cardiac Enzymes: No results for input(s): CKTOTAL, CKMB, CKMBINDEX, TROPONINI in the last 168 hours. BNP (last 3 results) No results for input(s): PROBNP in the last 8760 hours. HbA1C: No results for input(s): HGBA1C in the last 72 hours. CBG: Recent Labs  Lab 03/20/24 1745  GLUCAP 83   Lipid Profile: No results for input(s): CHOL, HDL, LDLCALC, TRIG, CHOLHDL, LDLDIRECT in the last 72 hours. Thyroid Function Tests: No results for input(s): TSH, T4TOTAL, FREET4, T3FREE, THYROIDAB in the last 72 hours. Anemia Panel: No results for input(s): VITAMINB12, FOLATE, FERRITIN, TIBC, IRON, RETICCTPCT in the last 72 hours. Sepsis Labs: Recent Labs  Lab 03/20/24 1806 03/20/24 1955 03/20/24 2328 03/21/24 0433  LATICACIDVEN 3.2* 3.1* 2.6* 1.5    Recent Results (from the past 240 hours)  Culture, blood (routine x 2)     Status: None (Preliminary result)   Collection Time:  03/20/24  5:56 PM   Specimen: BLOOD  Result Value Ref Range Status   Specimen Description BLOOD SITE NOT SPECIFIED  Final   Special Requests   Final    BOTTLES DRAWN AEROBIC AND ANAEROBIC Blood Culture results may not be optimal due to an inadequate volume of blood received in culture bottles   Culture   Final    NO GROWTH < 24 HOURS Performed at [facility], [address] [place], [place] [address]    Report Status PENDING  Incomplete  Culture, blood (routine x 2)     Status: None (Preliminary result)   Collection Time: 03/20/24  5:56 PM   Specimen: BLOOD LEFT ARM  Result Value Ref Range Status   Specimen Description BLOOD LEFT ARM  Final   Special Requests   Final    BOTTLES DRAWN AEROBIC AND ANAEROBIC Blood Culture results may not be optimal due to an inadequate volume of blood received in culture bottles   Culture   Final  NO GROWTH < 24 HOURS Performed at [facility], [address], [place], [place] [address]    Report Status PENDING  Incomplete         Radiology Studies: CT Head Wo Contrast Result Date: 03/20/2024 CLINICAL DATA:  AMS EXAM: CT HEAD WITHOUT CONTRAST TECHNIQUE: Contiguous axial images were obtained from the base of the skull through the vertex without intravenous contrast. RADIATION DOSE REDUCTION: This exam was performed according to the departmental dose-optimization program which includes automated exposure control, adjustment of the mA and/or kV according to patient size and/or use of iterative reconstruction technique. COMPARISON:  None Available. FINDINGS: Brain: Remote left PCA territory infarct. No evidence of acute large vascular territory infarct, acute hemorrhage, mass lesion, midline shift or hydrocephalus. Cerebral atrophy. Vascular: No hyperdense vessel. Skull: No acute fracture. Sinuses/Orbits: Clear sinuses.  No acute orbital findings. Other: No mastoid effusions. IMPRESSION: 1. No evidence of acute intracranial abnormality. 2.  Remote left PCA territory infarct. Electronically Signed   By: Gilmore GORMAN Molt M.D.   On: 03/20/2024 19:50   DG Pelvis Portable Result Date: 03/20/2024 CLINICAL DATA:  [name] EXAM: PORTABLE PELVIS 1-2 VIEWS COMPARISON:  None Available. FINDINGS: Supine frontal view of the pelvis was obtained, excluding portions of the proximal right femur by collimation. No acute fracture, subluxation, or dislocation. Symmetrical bilateral hip osteoarthritis. Sacroiliac joints are unremarkable. Penile prosthesis incidentally noted. IMPRESSION: 1. No acute pelvic fracture. Electronically Signed   By: Ozell Daring M.D.   On: 03/20/2024 19:39   DG Chest Portable 1 View Result Date: 03/20/2024 CLINICAL DATA:  Altered level of consciousness, fell EXAM: PORTABLE CHEST 1 VIEW COMPARISON:  None Available. FINDINGS: Single frontal view of the chest demonstrates an unremarkable cardiac silhouette. Ectasia and atherosclerosis of the thoracic aorta. No acute airspace disease, effusion, or pneumothorax. No acute displaced fracture. IMPRESSION: 1. No acute intrathoracic process. Electronically Signed   By: Ozell Daring M.D.   On: 03/20/2024 19:38        Scheduled Meds:  apixaban   2.5 mg Oral BID   atorvastatin   20 mg Oral Daily   Continuous Infusions:  sodium chloride  75 mL/hr at 03/20/24 2216          Kelvis Berger, MD [facility] 03/21/2024, 1:11 PM

## 2024-03-21 NOTE — Evaluation (Signed)
 Physical Therapy Evaluation Patient Details Name: Jeffrey Rojas MRN: [id number] DOB: 1934-10-25 Today's Date: 03/21/2024  History of Present Illness  89 y.o. male presents from home due to altered mental status. Pt with Acute metabolic encephalopathy, lactic acidosis, AKI, possible fall. PMH: paroxysmal A-fib on Eliquis , hypertension, hyperlipidemia, history of CVA in 2019, history of prostate cancer status post prostatectomy.  Clinical Impression  Patient presents with decreased independence though not sure on prior functional level.  Today needing CGA to min A throughout for mobility in room and hallway with RW and noted initial posterior LOB.  Patient states he lives alone and completes ADL/IADL though noted from chart he has a caregiver.  Currently unable to safely be alone at home with fall risk and decreased cognition.  Recommend post-acute inpatient rehab prior to d/c home.  PT will continue to follow.       If plan is discharge home, recommend the following: A little help with walking and/or transfers;A little help with bathing/dressing/bathroom;Help with stairs or ramp for entrance;Assist for transportation;Assistance with cooking/housework   Can travel by private vehicle   Yes    Equipment Recommendations None recommended by PT  Recommendations for Other Services       Functional Status Assessment Patient has had a recent decline in their functional status and demonstrates the ability to make significant improvements in function in a reasonable and predictable amount of time.     Precautions / Restrictions Precautions Precautions: Fall Recall of Precautions/Restrictions: Impaired      Mobility  Bed Mobility               General bed mobility comments: in recliner    Transfers Overall transfer level: Needs assistance Equipment used: Rolling walker (2 wheels) Transfers: Sit to/from Stand Sit to Stand: Min assist           General transfer comment: assist  for balance, cues for hand placement increased time    Ambulation/Gait Ambulation/Gait assistance: Min assist, Contact guard assist Gait Distance (Feet): 100 Feet Assistive device: Rolling walker (2 wheels) Gait Pattern/deviations: Step-to pattern, Step-through pattern, Decreased stride length, Leaning posteriorly       General Gait Details: initially upon standing with posterior lean needing min A progression  to CGA for ambulation in hallway though cues throughout for direction  Stairs            Wheelchair Mobility     Tilt Bed    Modified Rankin (Stroke Patients Only)       Balance Overall balance assessment: Needs assistance Sitting-balance support: Feet supported Sitting balance-Leahy Scale: Fair     Standing balance support: Bilateral upper extremity supported, Reliant on assistive device for balance Standing balance-Leahy Scale: Poor                               Pertinent Vitals/Pain Pain Assessment Pain Assessment: Faces Faces Pain Scale: No hurt    Home Living Family/patient expects to be discharged to:: Private residence Living Arrangements: Alone                 Additional Comments: states has family in [place] and none here, unable to give information on home and level of help he has, states he fixes his own food; pr chart has caregiver    Prior Function Prior Level of Function : Patient poor historian/Family not available  Extremity/Trunk Assessment   Upper Extremity Assessment Upper Extremity Assessment: Defer to OT evaluation    Lower Extremity Assessment Lower Extremity Assessment: Generalized weakness    Cervical / Trunk Assessment Cervical / Trunk Assessment: Normal;Other exceptions Cervical / Trunk Exceptions: cachexia  Communication   Communication Communication: Impaired Factors Affecting Communication: Hearing impaired    Cognition Arousal: Alert Behavior During Therapy:  Flat affect   PT - Cognitive impairments: No family/caregiver present to determine baseline, Orientation, Attention, Safety/Judgement, Problem solving, Initiation   Orientation impairments: Place, Time, Situation                   PT - Cognition Comments: focused attention, very slow initiation and processing Following commands: Impaired Following commands impaired: Follows one step commands inconsistently, Follows one step commands with increased time     Cueing Cueing Techniques: Verbal cues, Tactile cues, Visual cues     General Comments General comments (skin integrity, edema, etc.): VSS with mobility    Exercises     Assessment/Plan    PT Assessment Patient needs continued PT services  PT Problem List Decreased strength;Decreased balance;Decreased cognition;Decreased activity tolerance;Decreased safety awareness;Decreased mobility       PT Treatment Interventions DME instruction;Functional mobility training;Balance training;Gait training;Therapeutic activities;Neuromuscular re-education;Therapeutic exercise;Patient/family education    PT Goals (Current goals can be found in the Care Plan section)  Acute Rehab PT Goals PT Goal Formulation: Patient unable to participate in goal setting Time For Goal Achievement: 04/04/24 Potential to Achieve Goals: Fair    Frequency Min 2X/week     Co-evaluation               AM-PAC PT 6 Clicks Mobility  Outcome Measure Help needed turning from your back to your side while in a flat bed without using bedrails?: A Little Help needed moving from lying on your back to sitting on the side of a flat bed without using bedrails?: A Little Help needed moving to and from a bed to a chair (including a wheelchair)?: A Little Help needed standing up from a chair using your arms (e.g., wheelchair or bedside chair)?: A Little Help needed to walk in hospital room?: A Little Help needed climbing 3-5 steps with a railing? : Total 6  Click Score: 16    End of Session Equipment Utilized During Treatment: Gait belt Activity Tolerance: Patient limited by fatigue Patient left: in chair;with chair alarm set;with call bell/phone within reach   PT Visit Diagnosis: Other abnormalities of gait and mobility (R26.89);Muscle weakness (generalized) (M62.81)    Time: 1200-1230 PT Time Calculation (min) (ACUTE ONLY): 30 min   Charges:   PT Evaluation $PT Eval Moderate Complexity: 1 Mod PT Treatments $Gait Training: 8-22 mins PT General Charges $$ ACUTE PT VISIT: 1 Visit         Micheline Rojas, PT Acute Rehabilitation Services Office:[phone] 03/21/2024   Jeffrey Rojas 03/21/2024, 4:56 PM

## 2024-03-22 DIAGNOSIS — R4182 Altered mental status, unspecified: Secondary | ICD-10-CM | POA: Diagnosis not present

## 2024-03-22 MED ORDER — SODIUM CHLORIDE 0.9 % IV SOLN
1.0000 g | Freq: Every day | INTRAVENOUS | Status: DC
  Filled 2024-03-22 (×3): qty 10

## 2024-03-23 DIAGNOSIS — R4182 Altered mental status, unspecified: Secondary | ICD-10-CM | POA: Diagnosis not present

## 2024-03-24 DIAGNOSIS — N179 Acute kidney failure, unspecified: Secondary | ICD-10-CM | POA: Diagnosis not present

## 2024-03-24 DIAGNOSIS — R4182 Altered mental status, unspecified: Secondary | ICD-10-CM | POA: Diagnosis not present

## 2024-03-24 DIAGNOSIS — G9341 Metabolic encephalopathy: Secondary | ICD-10-CM | POA: Diagnosis not present

## 2024-03-25 DIAGNOSIS — N179 Acute kidney failure, unspecified: Secondary | ICD-10-CM | POA: Diagnosis not present

## 2024-03-25 DIAGNOSIS — R4182 Altered mental status, unspecified: Secondary | ICD-10-CM | POA: Diagnosis not present

## 2024-03-25 LAB — BASIC METABOLIC PANEL WITH GFR
Anion gap: 7 (ref 5–15)
BUN: 33 mg/dL — ABNORMAL HIGH (ref 8–23)
CO2: 24 mmol/L (ref 22–32)
Calcium: 8.3 mg/dL — ABNORMAL LOW (ref 8.9–10.3)
Chloride: 104 mmol/L (ref 98–111)
Creatinine, Ser: 1.71 mg/dL — ABNORMAL HIGH (ref 0.61–1.24)
GFR, Estimated: 39 mL/min — ABNORMAL LOW (ref >60)
Glucose, Bld: 113 mg/dL — ABNORMAL HIGH (ref 70–99)
Potassium: 3.7 mmol/L (ref 3.5–5.1)
Sodium: 135 mmol/L (ref 135–145)

## 2024-03-25 MED ORDER — APIXABAN 2.5 MG PO TABS: 2.5000 mg | ORAL_TABLET | Freq: Two times a day (BID) | ORAL | Status: AC

## 2024-03-25 MED ORDER — CEFDINIR 300 MG PO CAPS: 300.0000 mg | ORAL_CAPSULE | Freq: Two times a day (BID) | ORAL | 0 refills | Status: AC

## 2024-03-25 NOTE — TOC Progression Note (Signed)
 Transition of Care (TOC) - Progression Note   Spoke to patient's daughter via phone Angela [phone] .   Confirmed plan for discharge today. Her daughter is on way to hospital and will provide transport home.   Patient has two walkers at home but patient will not use per daughter.   Daughter in agreement for HHPT. No preference . Kelly with [facility] accepted referral for HHPT. NCM secure chatted MD for orders and face to face  Patient Details  Name: Jeffrey Rojas MRN: [id number] Date of Birth: Mar 04, 1935  Transition of Care [facility]) CM/SW Contact  Adalberto Metzgar, Powell Jansky, RN Phone Number: 03/25/2024, 11:12 AM  Clinical Narrative:         Barriers to Discharge: Insurance Authorization, Continued Medical Work up, SNF Pending bed offer               Expected Discharge Plan and Services In-house Referral: Clinical Social Work     Living arrangements for the past 2 months: Single Family Home Expected Discharge Date: 03/25/24                                     Social Drivers of Health (SDOH) Interventions SDOH Screenings   Food Insecurity: Patient Unable To Answer (03/21/2024)  Housing: Patient Unable To Answer (03/21/2024)  Transportation Needs: Patient Unable To Answer (03/21/2024)  Utilities: Patient Unable To Answer (03/21/2024)  Social Connections: Patient Unable To Answer (03/21/2024)    Readmission Risk Interventions     No data to display

## 2024-03-25 NOTE — Progress Notes (Addendum)
 Reviewed med details with family and patient   waiting for case manager to get face to face and put in HHPT

## 2024-03-25 NOTE — Plan of Care (Signed)

## 2024-03-25 NOTE — Progress Notes (Signed)
 Mobility Specialist: Progress Note   03/25/24 1035  Mobility  Activity Ambulated with assistance  Level of Assistance Contact guard assist, steadying assist  Assistive Device Front wheel walker  Distance Ambulated (ft) 125 ft  Activity Response Tolerated well  Mobility Referral Yes  Mobility visit 1 Mobility  Mobility Specialist Start Time (ACUTE ONLY) 0935  Mobility Specialist Stop Time (ACUTE ONLY) 0949  Mobility Specialist Time Calculation (min) (ACUTE ONLY) 14 min    Pt received in bed, agreeable to mobility session. SV for bed mobility. Light minA for STS. CGA for ambulation. No complaints. Returned to room without fault. Left in chair with all needs met, call bell in reach. Chair alarm on.   Ileana Lute Mobility Specialist Please contact via SecureChat or Rehab office at [phone]

## 2024-03-25 NOTE — Discharge Summary (Signed)
 Physician Discharge Summary  Prinston Kynard FMW:[id number] DOB: 22-Nov-1934 DOA: 03/20/2024  PCP: Rudolpho Norleen BIRCH, MD  Admit date: 03/20/2024 Discharge date: 03/25/2024  Admitted From: Home Disposition: Home with [facility]  Recommendations for Outpatient Follow-up:  Follow up with PCP in 1 week with repeat CBC/BMP Follow up in ED if symptoms worsen or new appear   Home Health: No Equipment/Devices: None  Discharge Condition: Stable CODE STATUS: Full Diet recommendation: Heart healthy  Brief/Interim Summary:  Deja Kaigler is a 89 y.o. male with medical history significant for paroxysmal A-fib on Eliquis , hypertension, hyperlipidemia, history of CVA in 2019, history of prostate cancer status post prostatectomy, who presents to the ER via EMS from home due to altered mental status.  The patient is unable to provide a history because of this.  He was found in a chair by caregiver around 1 PM with confusion.   Patient was hemodynamically stable on presentation.  Lactic acid was elevated to 3.1 likely from dehydration and metformin use.  Creatinine also elevated to 3.1 with baseline around 1.  Patient was admitted for further encephalopathy likely secondary to AKI.  Head CT was negative.       Assessment/Plan Acute metabolic encephalopathy, unclear etiology  Etiology likely AKI, dehydration along with possible UTI. CT of abdomen pelvis is concerning for pyelonephritis/cystitis.  Patient however is not complaining of abdominal pain.  Does not have any fever.  Vital signs are stable.   Received IV ceftriaxone  in the hospital, discharged on few days of Omnicef  to complete about 7 days of therapy.  AKI, suspect multifactorial BUN 35, creatinine 3.11 on admission, has slowly improved to 1.7 with continued IV hydration.  Urine output is excellent   CT abdomen and pelvis is negative for postobstructive cause.   Discussed with granddaughter at the bedside about continued need for hydration at home Need  to avoid NSAIDs  Lactic acidosis, suspect secondary to dehydration Lactic acid 3.2 has cleared.  Likely from metformin, dehydration.  Metformin is discontinued.  His blood sugars has been on the lower side .  Need to follow-up with PCP in 1 week for alternative therapy.  Recommend hemoglobin A1c  Hypovolemic hyponatremia Resolved with IV hydration.  Hypokalemia: Potassium 5.2.  Resolved.     Paroxysmal A-fib on Eliquis  Continue Eliquis .  Possible fall prior to admission PT OT recommended SNF however family declined.  Plans for home health PT  History of CVA Noncontrast head CT 03/20/24 revealed remote left PCA territory infarct   Hyperlipidemia Resume home Lipitor   Hypertension Hold off home oral antihypertensives for now due to soft blood pressures.  On discharge hydrochlorothiazide will be discontinued.  Amlodipine /benazepril  will be on hold.  May need to resume in the future.  Will follow-up with PCP  Discharge Diagnoses:  Principal Problem:   AMS (altered mental status) Active Problems:   AKI (acute kidney injury)   Paroxysmal atrial fibrillation (HCC)   HTN (hypertension)   Hyperlipidemia    Discharge Instructions  Discharge Instructions     Call MD for:  persistant dizziness or light-headedness   Complete by: As directed    Call MD for:  persistant nausea and vomiting   Complete by: As directed    Call MD for:  temperature >100.4   Complete by: As directed    Diet - low sodium heart healthy   Complete by: As directed    Discharge instructions   Complete by: As directed    1.  Please drink plenty of  water at home to maintain hydration. 2.  Continue to hold amlodipine /benazepril  due to acute kidney injury.  Please follow-up with PCP to discuss about the timing of resumption. 3.  Stop taking hydrochlorothiazide due to AKI and dehydration 4.  Also stop taking metformin due to lactic acidosis, kidney injury.  Please follow-up with PCP for alternatives.  Of note,  your blood sugars have been well-controlled without treatment in the hospital. 5.  Continue taking Eliquis  for stroke prophylaxis. 6.  Take oral antibiotics for 3 days after discharge to complete a course for UTI   Increase activity slowly   Complete by: As directed       Allergies as of 03/25/2024   No Known Allergies      Medication List     PAUSE taking these medications    amLODipine -benazepril  10-20 MG capsule Wait to take this until your doctor or other care provider tells you to start again. Commonly known as: LOTREL Take 1 capsule by mouth in the morning. 8am       STOP taking these medications    hydrochlorothiazide 25 MG tablet Commonly known as: HYDRODIURIL   metFORMIN 1000 MG tablet Commonly known as: GLUCOPHAGE       TAKE these medications    apixaban  2.5 MG Tabs tablet Commonly known as: ELIQUIS  Take 1 tablet (2.5 mg total) by mouth 2 (two) times daily.   atorvastatin  20 MG tablet Commonly known as: LIPITOR Take 20 mg by mouth in the morning. 8am   busPIRone  5 MG tablet Commonly known as: BUSPAR  Take 5 mg by mouth 2 (two) times daily. 8am & 8pm   cefdinir  300 MG capsule Commonly known as: OMNICEF  Take 1 capsule (300 mg total) by mouth 2 (two) times daily.   venlafaxine  XR 150 MG 24 hr capsule Commonly known as: EFFEXOR -XR Take 150 mg by mouth in the morning.        Follow-up Information     Rudolpho Norleen BIRCH, MD Follow up in 1 week(s).   Specialty: Internal Medicine Contact information: [address] [facility] [place] [address] [phone]         [facility] Follow up.   Specialty: Home Health Services Contact information: [address] [place] [address] [phone]                No Known Allergies  Consultations:    Procedures/Studies: CT ABDOMEN PELVIS WO CONTRAST Result Date: 03/21/2024 CLINICAL DATA:  Acute renal failure. EXAM: CT ABDOMEN AND PELVIS WITHOUT  CONTRAST TECHNIQUE: Multidetector CT imaging of the abdomen and pelvis was performed following the standard protocol without IV contrast. RADIATION DOSE REDUCTION: This exam was performed according to the departmental dose-optimization program which includes automated exposure control, adjustment of the mA and/or kV according to patient size and/or use of iterative reconstruction technique. COMPARISON:  None Available. FINDINGS: Lower chest: There is atelectasis in the lung bases. Hepatobiliary: Gallbladder sludge or small stones present. There is no biliary ductal dilatation. Liver appears within normal limits allowing for lack of intravenous contrast. Pancreas: Unremarkable. No pancreatic ductal dilatation or surrounding inflammatory changes. Spleen: Normal in size without focal abnormality. Adrenals/Urinary Tract: There is mild bilateral perinephric fat stranding and fluid. There is a single punctate calculus in the left kidney. There is no hydronephrosis. The adrenal glands are within normal limits. Kidneys are normal in size. There is diffuse bladder wall thickening with surrounding inflammation. Stomach/Bowel: Stomach is within normal limits. Appendix appears normal.  No evidence of bowel wall thickening, distention, or inflammatory changes. There is a large amount of stool throughout the colon. Vascular/Lymphatic: Aortic atherosclerosis. No enlarged abdominal or pelvic lymph nodes. Reproductive: Prostate gland is small or absent. Penile pump present. Other: No abdominal wall hernia or abnormality. No abdominopelvic ascites. Musculoskeletal: No fracture is seen. Degenerative changes affect the spine. There is a heterogeneous subcutaneous collection posterior to the right greater trochanter measuring 3.3 x 1.3 by 5.7 cm. IMPRESSION: 1. Diffuse bladder wall thickening with surrounding inflammation worrisome for cystitis. 2. Mild bilateral perinephric fat stranding and fluid may be related to infection. 3.  Nonobstructing left renal calculus. 4. Cholelithiasis. 5. Subcutaneous collection posterior to the right greater trochanter may represent hematoma or abscess. 6. Aortic atherosclerosis. Aortic Atherosclerosis (ICD10-I70.0). Electronically Signed   By: Greig Pique M.D.   On: 03/21/2024 16:34   CT Head Wo Contrast Result Date: 03/20/2024 CLINICAL DATA:  AMS EXAM: CT HEAD WITHOUT CONTRAST TECHNIQUE: Contiguous axial images were obtained from the base of the skull through the vertex without intravenous contrast. RADIATION DOSE REDUCTION: This exam was performed according to the departmental dose-optimization program which includes automated exposure control, adjustment of the mA and/or kV according to patient size and/or use of iterative reconstruction technique. COMPARISON:  None Available. FINDINGS: Brain: Remote left PCA territory infarct. No evidence of acute large vascular territory infarct, acute hemorrhage, mass lesion, midline shift or hydrocephalus. Cerebral atrophy. Vascular: No hyperdense vessel. Skull: No acute fracture. Sinuses/Orbits: Clear sinuses.  No acute orbital findings. Other: No mastoid effusions. IMPRESSION: 1. No evidence of acute intracranial abnormality. 2. Remote left PCA territory infarct. Electronically Signed   By: Gilmore GORMAN Molt M.D.   On: 03/20/2024 19:50   DG Pelvis Portable Result Date: 03/20/2024 CLINICAL DATA:  [name] EXAM: PORTABLE PELVIS 1-2 VIEWS COMPARISON:  None Available. FINDINGS: Supine frontal view of the pelvis was obtained, excluding portions of the proximal right femur by collimation. No acute fracture, subluxation, or dislocation. Symmetrical bilateral hip osteoarthritis. Sacroiliac joints are unremarkable. Penile prosthesis incidentally noted. IMPRESSION: 1. No acute pelvic fracture. Electronically Signed   By: Ozell Daring M.D.   On: 03/20/2024 19:39   DG Chest Portable 1 View Result Date: 03/20/2024 CLINICAL DATA:  Altered level of consciousness, fell  EXAM: PORTABLE CHEST 1 VIEW COMPARISON:  None Available. FINDINGS: Single frontal view of the chest demonstrates an unremarkable cardiac silhouette. Ectasia and atherosclerosis of the thoracic aorta. No acute airspace disease, effusion, or pneumothorax. No acute displaced fracture. IMPRESSION: 1. No acute intrathoracic process. Electronically Signed   By: Ozell Daring M.D.   On: 03/20/2024 19:38      Subjective:   Discharge Exam: Vitals:   03/25/24 0513 03/25/24 0812  BP: 126/78 (!) 152/81  Pulse: 76 67  Resp: 17   Temp: 98.8 F (37.1 C) 98.1 F (36.7 C)  SpO2: 100% 98%    General: Pt is alert, awake, not in acute distress Cardiovascular: rate controlled, S1/S2 + Respiratory: bilateral decreased breath sounds at bases Abdominal: Soft, NT, ND, bowel sounds + Extremities: no edema, no cyanosis    The results of significant diagnostics from this hospitalization (including imaging, microbiology, ancillary and laboratory) are listed below for reference.     Microbiology: Recent Results (from the past 240 hours)  Culture, blood (routine x 2)     Status: None   Collection Time: 03/20/24  5:56 PM   Specimen: BLOOD  Result Value Ref Range Status   Specimen Description BLOOD SITE NOT SPECIFIED  Final   Special Requests   Final    BOTTLES DRAWN AEROBIC AND ANAEROBIC Blood Culture results may not be optimal due to an inadequate volume of blood received in culture bottles   Culture   Final    NO GROWTH 5 DAYS Performed at [facility], [address] [place], [place] [address]    Report Status 03/25/2024 FINAL  Final  Culture, blood (routine x 2)     Status: None   Collection Time: 03/20/24  5:56 PM   Specimen: BLOOD LEFT ARM  Result Value Ref Range Status   Specimen Description BLOOD LEFT ARM  Final   Special Requests   Final    BOTTLES DRAWN AEROBIC AND ANAEROBIC Blood Culture results may not be optimal due to an inadequate volume of blood received in culture bottles    Culture   Final    NO GROWTH 5 DAYS Performed at [facility], [address] [place], [place] [address]    Report Status 03/25/2024 FINAL  Final     Labs: BNP (last 3 results) No results for input(s): BNP in the last 8760 hours. Basic Metabolic Panel: Recent Labs  Lab 03/21/24 0433 03/21/24 1433 03/22/24 1110 03/23/24 1601 03/24/24 0437 03/25/24 0525  NA 135 132* 133* 135 136 135  K 5.2* 5.0 4.4 3.7 3.8 3.7  CL 100 97* 99 99 102 104  CO2 27 26 21* 26 23 24   GLUCOSE 93 139* 88 130* 82 113*  BUN 36* 38* 36* 34* 36* 33*  CREATININE 3.43* 3.45* 2.90* 2.39* 2.08* 1.71*  CALCIUM  9.2 9.1 9.1 8.9 8.5* 8.3*  MG 1.7  --   --   --   --   --   PHOS 3.6  --   --   --   --   --    Liver Function Tests: Recent Labs  Lab 03/20/24 1653  AST 21  ALT 13  ALKPHOS 126  BILITOT 0.9  PROT 7.3  ALBUMIN 3.7   No results for input(s): LIPASE, AMYLASE in the last 168 hours. No results for input(s): AMMONIA in the last 168 hours. CBC: Recent Labs  Lab 03/20/24 1653 03/21/24 0433  WBC 9.3 8.1  NEUTROABS 6.6  --   HGB 14.0 13.9  HCT 43.5 42.8  MCV 91.0 89.4  PLT 270 266   Cardiac Enzymes: No results for input(s): CKTOTAL, CKMB, CKMBINDEX, TROPONINI in the last 168 hours. BNP: Invalid input(s): POCBNP CBG: Recent Labs  Lab 03/20/24 1745  GLUCAP 83   D-Dimer No results for input(s): DDIMER in the last 72 hours. Hgb A1c No results for input(s): HGBA1C in the last 72 hours. Lipid Profile No results for input(s): CHOL, HDL, LDLCALC, TRIG, CHOLHDL, LDLDIRECT in the last 72 hours. Thyroid function studies No results for input(s): TSH, T4TOTAL, T3FREE, THYROIDAB in the last 72 hours.  Invalid input(s): FREET3 Anemia work up No results for input(s): VITAMINB12, FOLATE, FERRITIN, TIBC, IRON, RETICCTPCT in the last 72 hours. Urinalysis    Component Value Date/Time   COLORURINE YELLOW 03/22/2024 1918    APPEARANCEUR HAZY (A) 03/22/2024 1918   LABSPEC 1.008 03/22/2024 1918   PHURINE 7.0 03/22/2024 1918   GLUCOSEU NEGATIVE 03/22/2024 1918   HGBUR LARGE (A) 03/22/2024 1918   BILIRUBINUR NEGATIVE 03/22/2024 1918   KETONESUR NEGATIVE 03/22/2024 1918   PROTEINUR 30 (A) 03/22/2024 1918   NITRITE NEGATIVE 03/22/2024 1918   LEUKOCYTESUR LARGE (A) 03/22/2024 1918   Sepsis Labs Recent Labs  Lab 03/20/24 1653 03/21/24 0433  WBC 9.3 8.1   Microbiology Recent Results (from the past 240 hours)  Culture, blood (routine x 2)     Status: None   Collection Time: 03/20/24  5:56 PM   Specimen: BLOOD  Result Value Ref Range Status   Specimen Description BLOOD SITE NOT SPECIFIED  Final   Special Requests   Final    BOTTLES DRAWN AEROBIC AND ANAEROBIC Blood Culture results may not be optimal due to an inadequate volume of blood received in culture bottles   Culture   Final    NO GROWTH 5 DAYS Performed at [facility], [address], [place], [place] [address]    Report Status 03/25/2024 FINAL  Final  Culture, blood (routine x 2)     Status: None   Collection Time: 03/20/24  5:56 PM   Specimen: BLOOD LEFT ARM  Result Value Ref Range Status   Specimen Description BLOOD LEFT ARM  Final   Special Requests   Final    BOTTLES DRAWN AEROBIC AND ANAEROBIC Blood Culture results may not be optimal due to an inadequate volume of blood received in culture bottles   Culture   Final    NO GROWTH 5 DAYS Performed at [facility], [address] [place], [place] [address]    Report Status 03/25/2024 FINAL  Final     Time coordinating discharge: 35 minutes  SIGNED:   Derryl Duval, MD  [facility] 03/25/2024, 5:16 PM

## 2024-03-25 NOTE — Progress Notes (Signed)
 Occupational Therapy Treatment Patient Details Name: Deiontae Rabel MRN: [id number] DOB: 1935/01/06 Today's Date: 03/25/2024   History of present illness 89 y.o. male presents from home due to altered mental status. Pt with Acute metabolic encephalopathy, lactic acidosis, AKI, possible fall. PMH: paroxysmal A-fib on Eliquis , hypertension, hyperlipidemia, history of CVA in 2019, history of prostate cancer status post prostatectomy.   OT comments  Pt is making great progress towards their acute OT goals. He was able to transfer and mobilize with superivsion A and cues for RW management and safety. He tolerated standing ADLs at the sink without physical assist but continues to benefit from cues for initiation, sequencing and safety. OT to continue to follow acutely to facilitate progress towards established goals. Pt will continue to benefit from [facility] and increased support from family.       If plan is discharge home, recommend the following:  A little help with walking and/or transfers;A little help with bathing/dressing/bathroom;Assistance with cooking/housework;Direct supervision/assist for medications management;Direct supervision/assist for financial management;Assist for transportation;Help with stairs or ramp for entrance         Precautions / Restrictions Precautions Precautions: Fall Restrictions Weight Bearing Restrictions Per Provider Order: No       Mobility Bed Mobility Overal bed mobility: Needs Assistance             General bed mobility comments: OOB on arrival    Transfers Overall transfer level: Needs assistance Equipment used: Rolling walker (2 wheels) Transfers: Sit to/from Stand Sit to Stand: Supervision                 Balance Overall balance assessment: Needs assistance Sitting-balance support: Feet supported Sitting balance-Leahy Scale: Good     Standing balance support: Single extremity supported, During functional activity Standing  balance-Leahy Scale: Fair Standing balance comment: statically at the sink           ADL either performed or assessed with clinical judgement   ADL Overall ADL's : Needs assistance/impaired Eating/Feeding: Independent;Sitting   Grooming: Supervision/safety;Standing           Upper Body Dressing : Set up;Sitting                   Functional mobility during ADLs: Supervision/safety;Rolling walker (2 wheels) General ADL Comments: pt benefits from cues intermittently for sequencing, initiation and safety    Extremity/Trunk Assessment Upper Extremity Assessment Upper Extremity Assessment: Generalized weakness   Lower Extremity Assessment Lower Extremity Assessment: Defer to PT evaluation        Vision   Vision Assessment?: No apparent visual deficits   Perception Perception Perception: Not tested   Praxis Praxis Praxis: Not tested   Communication Communication Communication: Impaired Factors Affecting Communication: Hearing impaired   Cognition Arousal: Alert Behavior During Therapy: Flat affect Cognition: Cognition impaired, No family/caregiver present to determine baseline                               Following commands: Impaired Following commands impaired: Only follows one step commands consistently, Follows multi-step commands inconsistently      Cueing   Cueing Techniques: Verbal cues        General Comments VSS on RA    Pertinent Vitals/ Pain       Pain Assessment Pain Assessment: No/denies pain   Frequency  Min 2X/week        Progress Toward Goals  OT Goals(current goals can now be found in  the care plan section)  Progress towards OT goals: Progressing toward goals  Acute Rehab OT Goals Patient Stated Goal: to go home OT Goal Formulation: Patient unable to participate in goal setting Time For Goal Achievement: 04/04/24 Potential to Achieve Goals: Good ADL Goals Pt Will Perform Lower Body Bathing: with min  assist;sit to/from stand Pt Will Perform Lower Body Dressing: with min assist;sit to/from stand Pt Will Transfer to Toilet: with supervision;ambulating   AM-PAC OT 6 Clicks Daily Activity     Outcome Measure   Help from another person eating meals?: A Little Help from another person taking care of personal grooming?: A Little Help from another person toileting, which includes using toliet, bedpan, or urinal?: Total (condom cath) Help from another person bathing (including washing, rinsing, drying)?: A Lot Help from another person to put on and taking off regular upper body clothing?: A Little Help from another person to put on and taking off regular lower body clothing?: A Lot 6 Click Score: 14    End of Session Equipment Utilized During Treatment: Gait belt;Rolling walker (2 wheels)  OT Visit Diagnosis: Unsteadiness on feet (R26.81);Other abnormalities of gait and mobility (R26.89);Muscle weakness (generalized) (M62.81);Other symptoms and signs involving cognitive function   Activity Tolerance Patient tolerated treatment well   Patient Left in chair;with call bell/phone within reach;with chair alarm set   Nurse Communication Mobility status        Time: 8954-8897 OT Time Calculation (min): 17 min  Charges: OT General Charges $OT Visit: 1 Visit OT Treatments $Self Care/Home Management : 8-22 mins  Lucie Kendall, OTR/L Acute Rehabilitation Services Office [phone] Secure Chat Communication Preferred   Lucie JONETTA Kendall 03/25/2024, 11:08 AM
